# Patient Record
Sex: Female | Born: 1970
Health system: Southern US, Community
[De-identification: ages and names within clinical notes are randomized; demographics above are authoritative.]

## PROBLEM LIST (undated history)

## (undated) DIAGNOSIS — R7989 Other specified abnormal findings of blood chemistry: Secondary | ICD-10-CM

## (undated) DIAGNOSIS — F909 Attention-deficit hyperactivity disorder, unspecified type: Secondary | ICD-10-CM

## (undated) DIAGNOSIS — M1990 Unspecified osteoarthritis, unspecified site: Secondary | ICD-10-CM

## (undated) DIAGNOSIS — K819 Cholecystitis, unspecified: Secondary | ICD-10-CM

## (undated) DIAGNOSIS — Z9889 Other specified postprocedural states: Secondary | ICD-10-CM

## (undated) DIAGNOSIS — S2239XA Fracture of one rib, unspecified side, initial encounter for closed fracture: Secondary | ICD-10-CM

## (undated) DIAGNOSIS — S3613XA Injury of bile duct, initial encounter: Secondary | ICD-10-CM

## (undated) DIAGNOSIS — L4050 Arthropathic psoriasis, unspecified: Secondary | ICD-10-CM

## (undated) DIAGNOSIS — R112 Nausea with vomiting, unspecified: Secondary | ICD-10-CM

## (undated) HISTORY — DX: Arthropathic psoriasis, unspecified: L40.50

## (undated) HISTORY — DX: Unspecified osteoarthritis, unspecified site: M19.90

## (undated) HISTORY — DX: Cholecystitis, unspecified: K81.9

## (undated) HISTORY — PX: INTRAUTERINE DEVICE INSERTION: SHX323

## (undated) HISTORY — DX: Fracture of one rib, unspecified side, initial encounter for closed fracture: S22.39XA

## (undated) HISTORY — DX: Other specified abnormal findings of blood chemistry: R79.89

---
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
## 2002-09-14 ENCOUNTER — Other Ambulatory Visit: Admission: RE | Admit: 2002-09-14 | Discharge: 2002-09-14 | Payer: Self-pay | Admitting: Obstetrics and Gynecology

## 2002-09-14 ENCOUNTER — Inpatient Hospital Stay (HOSPITAL_COMMUNITY): Admission: AD | Admit: 2002-09-14 | Discharge: 2002-09-14 | Payer: Self-pay | Admitting: Obstetrics and Gynecology

## 2003-03-12 ENCOUNTER — Inpatient Hospital Stay (HOSPITAL_COMMUNITY): Admission: AD | Admit: 2003-03-12 | Discharge: 2003-03-12 | Payer: Self-pay | Admitting: Obstetrics & Gynecology

## 2003-04-10 ENCOUNTER — Inpatient Hospital Stay (HOSPITAL_COMMUNITY): Admission: AD | Admit: 2003-04-10 | Discharge: 2003-04-13 | Payer: Self-pay | Admitting: Obstetrics & Gynecology

## 2003-05-23 ENCOUNTER — Other Ambulatory Visit: Admission: RE | Admit: 2003-05-23 | Discharge: 2003-05-23 | Payer: Self-pay | Admitting: Obstetrics and Gynecology

## 2006-01-27 ENCOUNTER — Ambulatory Visit: Payer: Self-pay | Admitting: Internal Medicine

## 2006-06-13 ENCOUNTER — Ambulatory Visit: Payer: Self-pay | Admitting: Internal Medicine

## 2006-10-31 ENCOUNTER — Ambulatory Visit: Payer: Self-pay | Admitting: Internal Medicine

## 2006-12-23 ENCOUNTER — Other Ambulatory Visit: Admission: RE | Admit: 2006-12-23 | Discharge: 2006-12-23 | Payer: Self-pay | Admitting: Obstetrics & Gynecology

## 2007-09-06 DIAGNOSIS — J309 Allergic rhinitis, unspecified: Secondary | ICD-10-CM | POA: Insufficient documentation

## 2008-02-02 ENCOUNTER — Other Ambulatory Visit: Admission: RE | Admit: 2008-02-02 | Discharge: 2008-02-02 | Payer: Self-pay | Admitting: Obstetrics and Gynecology

## 2008-09-15 ENCOUNTER — Emergency Department (HOSPITAL_COMMUNITY): Admission: EM | Admit: 2008-09-15 | Discharge: 2008-09-15 | Payer: Self-pay | Admitting: Emergency Medicine

## 2008-12-17 ENCOUNTER — Ambulatory Visit: Payer: Self-pay | Admitting: Internal Medicine

## 2008-12-30 ENCOUNTER — Ambulatory Visit: Payer: Self-pay | Admitting: Internal Medicine

## 2009-02-03 ENCOUNTER — Other Ambulatory Visit: Admission: RE | Admit: 2009-02-03 | Discharge: 2009-02-03 | Payer: Self-pay | Admitting: Obstetrics and Gynecology

## 2009-10-14 ENCOUNTER — Ambulatory Visit: Payer: Self-pay | Admitting: Internal Medicine

## 2009-10-14 DIAGNOSIS — L4050 Arthropathic psoriasis, unspecified: Secondary | ICD-10-CM

## 2010-01-09 ENCOUNTER — Ambulatory Visit: Payer: Self-pay | Admitting: Internal Medicine

## 2010-02-03 ENCOUNTER — Encounter: Payer: Self-pay | Admitting: Internal Medicine

## 2010-02-19 ENCOUNTER — Ambulatory Visit: Payer: Self-pay | Admitting: Sports Medicine

## 2010-03-09 ENCOUNTER — Encounter: Payer: Self-pay | Admitting: Internal Medicine

## 2010-05-14 ENCOUNTER — Encounter: Payer: Self-pay | Admitting: Internal Medicine

## 2010-10-28 ENCOUNTER — Ambulatory Visit: Payer: Self-pay | Admitting: Internal Medicine

## 2010-12-20 ENCOUNTER — Encounter: Payer: Self-pay | Admitting: Orthopedic Surgery

## 2010-12-28 ENCOUNTER — Ambulatory Visit
Admission: RE | Admit: 2010-12-28 | Discharge: 2010-12-28 | Payer: Self-pay | Source: Home / Self Care | Attending: Internal Medicine | Admitting: Internal Medicine

## 2010-12-29 ENCOUNTER — Ambulatory Visit
Admission: RE | Admit: 2010-12-29 | Discharge: 2010-12-29 | Payer: Self-pay | Source: Home / Self Care | Attending: Internal Medicine | Admitting: Internal Medicine

## 2010-12-29 NOTE — Assessment & Plan Note (Signed)
Summary: NP,HIP PAIN,RUNNER,MC   Vital Signs:  Patient profile:   40 year old female Height:      67 inches Weight:      150 pounds BMI:     23.58 BP sitting:   121 / 79  Vitals Entered By: Lillia Pauls CMA (February 19, 2010 2:29 PM)  History of Present Illness: 1 year ago to 85 mos had RT LBP epidural injection helped for 4 wks Dr August Saucer has followed more recently tingling down outside of RT leg NCV neg Injection of L2 did not help  RT lat foot pain severe xray neg Injection of SIJ and area resolved all pain for 2 weeks - dr newton SIJ area now pain now back and also piriformis  psoriatic arthritis dx at 28 Embrel started and felt better in 1 mo joint pain in hands, hip and back - this all improved  ideally runs 25 to 30 mpw running since age 89  since half marathon 5 days ago limping w post hip pain and also radiating form RT SIJ to ant thiigh    Allergies (verified): No Known Drug Allergies  Physical Exam  General:  Well-developed,well-nourished,in no acute distress; alert,appropriate and cooperative throughout examination Msk:  .RT hip w 30 deg IR and 40 deg of ER Left shows 30 and 45 weakness of hip abductors mod on left and mild on RT good hip ext rot srength and nl flex strength  TTP directly over piriformis on RT and this is also brought out with stretches  FABER - norm but painful on RT SIJ - both mobile and good pretzel stretch  Extremities:  bunion on left some first MTP change on RT mod collapse of arch  Running gait - antalgic on RT w clear limp   Impression & Recommendations:  Problem # 1:  HIP PAIN, RIGHT (ICD-719.45) There are findings consistent with piriformis but I don't think this explains all of findings  piriformis stretches hip strength exercises and part glut med  reck 1 month  Problem # 2:  BACK PAIN, LUMBAR, WITH RADICULOPATHY (ICD-724.4) This may be 2/2 SIJ since last lumbar injection did not help last SIJ injection got  rid of pain and numbness but only for 4 wks  note - this actually seems more mobile and not like chg seen w psoriasis  Problem # 3:  PSORIATIC ARTHRITIS (ICD-696.0) not clear if this could be causing sxs however, by Hx suggested she got excelletn response from embrel and no real flares that are generalized since  suggested seeing Dr Kellie Simmering as she does not have a rheumatologist now  Problem # 4:  ABNORMALITY OF GAIT (ICD-781.2)  this is very limited suggested x train and no running until no limp I don't think this is likely a stress fx based on hip exam and lack of groin pain but will follow  Orders: Sports Insoles (L3510)  Problem # 5:  BUNION, LEFT FOOT (ICD-727.1)  will put in cushioned sports insoles as she has lots of foot breakdown felt better in these w less imp w running  orthotics in future  Orders: Sports Insoles (L3510)  Complete Medication List: 1)  Multivitamins Tabs (Multiple vitamin) .... Once daily 2)  Omega-3 350 Mg Caps (Omega-3 fatty acids) .... Two times a day 3)  Enbrel 25 Mg/0.70ml Soln (Etanercept) .... Twice weekly--uses once a week

## 2010-12-29 NOTE — Letter (Signed)
Summary: [facility]  [facility]   Imported By: Maryln Gottron 05/27/2010 10:38:18  _____________________________________________________________________  External Attachment:    Type:   Image     Comment:   External Document

## 2010-12-29 NOTE — Letter (Signed)
Summary: [facility]  [facility]   Imported By: Maryln Gottron 04/02/2010 15:55:33  _____________________________________________________________________  External Attachment:    Type:   Image     Comment:   External Document

## 2010-12-29 NOTE — Assessment & Plan Note (Signed)
Summary: SORE THROAT, CONGESTION, BODY ACHES // RS   Vital Signs:  Patient profile:   40 year old female Weight:      150 pounds Temp:     99.2 degrees F BP sitting:   108 / 72  Vitals Entered By: Duard Brady LPN (January 09, 2010 9:20 AM) CC: c/o sore throat congestion x1week , co worked has strep last week ,body aches   CC:  c/o sore throat congestion x1week , co worked has strep last week , and body aches.  History of Present Illness: 40 year old patient who presents with a 5-day history of head and  chest congestion, and malaise.  For the past few days.  He is developed worsening sore throat.  This is her chief complaint.  Denies any fever or cervical adenopathy or strep exposure.  Sunday she ran 11 miles but has had extreme malaise over the past few days  Allergies: No Known Drug Allergies  Past History:  Past Medical History: Reviewed history from 12/17/2008 and no changes required. Arthritis Psoriasis Allergic rhinitis  Review of Systems       The patient complains of anorexia.  The patient denies fever, weight loss, weight gain, vision loss, decreased hearing, hoarseness, chest pain, syncope, dyspnea on exertion, peripheral edema, prolonged cough, headaches, hemoptysis, abdominal pain, melena, hematochezia, severe indigestion/heartburn, hematuria, incontinence, genital sores, muscle weakness, suspicious skin lesions, transient blindness, difficulty walking, depression, unusual weight change, abnormal bleeding, enlarged lymph nodes, angioedema, and breast masses.    Physical Exam  General:  Well-developed,well-nourished,in no acute distress; alert,appropriate and cooperative throughout examination; low-pressure normal Head:  Normocephalic and atraumatic without obvious abnormalities. No apparent alopecia or balding. Eyes:  No corneal or conjunctival inflammation noted. EOMI. Perrla. Funduscopic exam benign, without hemorrhages, exudates or papilledema. Vision  grossly normal. Ears:  External ear exam shows no significant lesions or deformities.  Otoscopic examination reveals clear canals, tympanic membranes are intact bilaterally without bulging, retraction, inflammation or discharge. Hearing is grossly normal bilaterally. Nose:  External nasal examination shows no deformity or inflammation. Nasal mucosa are pink and moist without lesions or exudates. Mouth:  pharyngeal erythema.  very mildpharyngeal erythema.   Neck:  No deformities, masses, or tenderness noted. no cervical adenopathy Lungs:  Normal respiratory effort, chest expands symmetrically. Lungs are clear to auscultation, no crackles or wheezes.   Impression & Recommendations:  Problem # 1:  URI (ICD-465.9)  Problem # 2:  SORE THROAT (ICD-462)  Orders: Rapid Strep (16109)  Complete Medication List: 1)  Multivitamins Tabs (Multiple vitamin) .... Once daily 2)  Omega-3 350 Mg Caps (Omega-3 fatty acids) .... Two times a day 3)  Enbrel 25 Mg/0.76ml Soln (Etanercept) .... Twice weekly--uses once a week  Patient Instructions: 1)  Get plenty of rest, drink lots of clear liquids, and use Tylenol  for fever and comfort. Return in 7-10 days if you're not better:sooner if you're feeling worse. 2)  VIMOVO ONE TWICE DAILY

## 2010-12-29 NOTE — Assessment & Plan Note (Signed)
Summary: chest congestion/cough/njr   Vital Signs:  Patient profile:   40 year old female Weight:      155 pounds Temp:     97.7 degrees F oral BP sitting:   112 / 68  (left arm) Cuff size:   regular  Vitals Entered By: Alfred Levins, CMA (October 28, 2010 11:41 AM) CC: cough, chest aches x1 wk   CC:  cough and chest aches x1 wk.  History of Present Illness: Chest congestion---nocturnal cough, chest aching minimal SOB not wheezing has hx of psoriasis---Enbrel use up to two months ago.   Current Medications (verified): 1)  Multivitamins  Tabs (Multiple Vitamin) .... Once Daily 2)  Omega-3 350 Mg Caps (Omega-3 Fatty Acids) .... Two Times A Day  Allergies (verified): No Known Drug Allergies  Physical Exam  General:  well-developed, thin female in no acute distress. HEENT exam atraumatic, normocephalic, neck supple. Chest without increased work of breathing. She does have rhonchi bilaterally most of them clear with cough.   Impression & Recommendations:  Problem # 1:  URI (ICD-465.9) immunosuppressed given duration, abnormal lung findings and history of immunosuppression I think it's safe assistive treat with an antibiotic. Side effects discussed. She'll call if symptoms don't improve.  Complete Medication List: 1)  Multivitamins Tabs (Multiple vitamin) .... Once daily 2)  Omega-3 350 Mg Caps (Omega-3 fatty acids) .... Two times a day 3)  Doxycycline Hyclate 100 Mg Caps (Doxycycline hyclate) .... Take 1 tab twice a day Prescriptions: DOXYCYCLINE HYCLATE 100 MG CAPS (DOXYCYCLINE HYCLATE) Take 1 tab twice a day  #20 x 0   Entered and Authorized by:   Birdie Sons MD   Signed by:   Birdie Sons MD on 10/28/2010   Method used:   Electronically to        [facility]  [address] [phone]* (retail)       [address]       [place], [place]  [address]       Ph: [phone] or [phone]       Fax: [phone]   RxID:   [id number]    Orders  Added: 1)  Est. Patient Level III [36644]

## 2010-12-29 NOTE — Letter (Signed)
Summary: [facility]  [facility]   Imported By: Maryln Gottron 02/25/2010 13:20:05  _____________________________________________________________________  External Attachment:    Type:   Image     Comment:   External Document

## 2010-12-30 ENCOUNTER — Telehealth: Payer: Self-pay | Admitting: Internal Medicine

## 2010-12-30 NOTE — Telephone Encounter (Signed)
Pt would like xray results. 

## 2010-12-30 NOTE — Telephone Encounter (Signed)
Pt called and was give xray results. Pt is wondering what she needs to do to sch a PFT? Pls call pt back.

## 2011-01-06 NOTE — Assessment & Plan Note (Signed)
Summary: CHEST CONGESTION // RS   Vital Signs:  Patient profile:   40 year old female Weight:      150 pounds BMI:     23.58 Temp:     97.9 degrees F oral BP sitting:   112 / 76  (left arm) Cuff size:   regular  Vitals Entered By: Alfred Levins, CMA (December 28, 2010 11:35 AM) CC: chest congestion, cough x1 wk   CC:  chest congestion and cough x1 wk.  History of Present Illness: cold early last week some chest tightness some "bubbling" chest sounds no fever or chills  never got completley better from october.   Current Medications (verified): 1)  Vitamin D 400 Unit Tabs (Cholecalciferol) .... Once Daily  Allergies (verified): No Known Drug Allergies  Past History:  Past Medical History: Last updated: 12/17/2008 Arthritis Psoriasis Allergic rhinitis  Past Surgical History: Last updated: 09/06/2007 Tonsillectomy  Family History: Last updated: 12/17/2008 Family History of Arthritis  Social History: Last updated: 09/06/2007 Married Never Smoked Alcohol use-yes Regular exercise-yes  Risk Factors: Exercise: yes (09/06/2007)  Risk Factors: Smoking Status: never (10/14/2009)  Physical Exam  General:   well-developed female in no acute distress. HEENT exam atraumatic, normocephalic, oropharynx is pink and moist. Neck is supple without lymphadenopathy or thyromegaly. Chest current auscultation with few rhonchi. The rhonchi clear with coughing. Cardiac exam S1-S2  normal. Extremities no edema.   Complete Medication List: 1)  Vitamin D 400 Unit Tabs (Cholecalciferol) .... Once daily  Other Orders: T-2 View CXR (71020TC)   Orders Added: 1)  T-2 View CXR [71020TC] 2)  Est. Patient Level III [03474]

## 2011-04-16 NOTE — H&P (Signed)
NAME:  Samantha Graves, Samantha Graves                       ACCOUNT NO.:  [id number]   MEDICAL RECORD NO.:  [id number]                   PATIENT TYPE:  INP   LOCATION:  9171                                 FACILITY:  [facility]   PHYSICIAN:  Genia Del, M.D.             DATE OF BIRTH:  1971-11-16   DATE OF ADMISSION:  04/10/2003  DATE OF DISCHARGE:                                HISTORY & PHYSICAL   HISTORY OF PRESENT ILLNESS:  Samantha Graves is a 40 year old G3, P2, A0 with  last menstrual period July 19, 2002 for an estimated date of delivery of  Apr 26, 2003 at 37 weeks and 5 days gestation.   REASON FOR ADMISSION:  PIH with increased liver enzymes.   HOSPITAL COURSE:  The patient presented to a return OB visit at [facility] on Apr 09, 2002. At this time, she was complaining of new onset of  headaches and mild nausea. Her history was positive for PIH times two at  term previously and her blood pressure was slightly increased but with a  normal in the 120's over 80's. Urine was negative for protein. PIH labs were  requested and platelets were stable at 142,000. Uric acid was normal at 4.2.  AST was slightly increased at 38. ALT was normal. Alkaline phosphatase was  slightly increased. The decision was taken to repeat those PIH labs later in  the afternoon and the AST was further increased mildly at 43. The rest was  stable with platelets at 146,000. The decision was taken to admit the  patient for induction. She was also reporting visual problems with blurred  vision and sustained headaches with nausea. Fetal movement is positive. No  regular uterine contractions. No vaginal bleeding. No fluid leak.   PAST MEDICAL HISTORY:  Positive for psoriatic arthritis, in remission.  History of migraines.   PAST SURGICAL HISTORY:  Negative.   PAST OB HISTORY:  In January of 1999 at 41 weeks, spontaneous vaginal  delivery of baby boy, 8 pounds and 2 ounces with mild PIH. In October of  2000 at  40 weeks, spontaneous vaginal delivery induced for mild PIH of a  girl, 8 pounds and 2 ounces after spontaneous vaginal delivery.   GYN HISTORY:  Negative.   SOCIAL HISTORY:  Married. Non-smoker.   FAMILY HISTORY:  Noncontributory.   ALLERGIES:  No known drug allergies.   MEDICATIONS:  Prenatal vitamins.   HISTORY OF PRESENT PREGNANCY:  First trimester was normal. Her labs showed  hemoglobin 13.1, platelets 172,000. Blood type Rh and O+. Rh antibody  negative. RPR non-reactive. ABS-AG negative. HIV declined. Rubella titer  immune. PAP test within normal limits. Gonorrhea and Chlamydia negative. She  had hyperemesis in the first trimester, treated with Zofran. She has also  Candida vaginitis, treated with Gynazole. Her psoriatic arthritis was in  remission. She was not on any medication for that. In the second trimester,  her triple  test was within normal limits. At 22+ weeks, ultrasound review of  anatomy was within normal limits with normal fluids, normal placenta and  cervix at 4.3 cm. In the third trimester, she had a 1-hour glucose tolerance  test within normal limits. At 29+ weeks, she was treated with Augmentin for  symptomatic sinusitis. At 35+ weeks, group B strep was positive. Her blood  pressures remained normal until the last visit. Good weight gain and  appropriate for gestational age.   REVIEW OF SYSTEMS:  Constitutional:  Negative. HEENT:  Negative. GI:  Nausea  today. No vomiting and no diarrhea. Urologic:  Negative. Dermatologic:  Negative. Endocrine:  Negative. Neurologic:  Headaches worsening today.   PHYSICAL EXAMINATION:  GENERAL:  No apparent distress.  VITAL SIGNS:  Blood pressure 121/68 and 129/71 on admission. Pulse at 90 and  regular. Afebrile. Respiratory rate 20.  LUNGS:  Clear bilaterally.  HEART:  Regular rate and rhythm.  No murmur.  ABDOMEN:  Gravid. Cephalic presentation. Monitoring fetal heart rate 130 to  140. Good aspiration. No variability. No  deceleration. No regular uterine  contractions.  GU:  Vaginal examination 1+ and 100% effaced, vertex -1 fixed.  Membranes  intact.  EXTREMITIES:  Lower limbs, mild edema. No clonus.   LABORATORY DATA:  PIH labs at the office this afternoon:  AST of 43, ALT 28,  ALP 140, uric acid 4.2, platelets 146,000. Hemoglobin 11.6.   IMPRESSION:  Gravida 3, para 2 at 37 weeks and 5 days gestation with mild  pregnancy-induced hypertension with slight increased AST. History of  pregnancy-induced hypertension times two. Fetal well being reassuring. Group  B strep positive.   PLAN:  Admit to labor and delivery for induction. Will start Pitocin low  dose, penicillin G for group B strep positive. Expectant __________ towards  probable vaginal delivery. Repeat PIH labs now and will repeat sequentially  per the clinic. No magnesium sulfate for now, given the fact that it is a  mild PIH.                                               Genia Del, M.D.    ML/MEDQ  D:  04/10/2003  T:  04/10/2003  Job:  [id number]

## 2011-08-31 LAB — DIFFERENTIAL
Eosinophils Absolute: 0.1
Eosinophils Relative: 1
Lymphocytes Relative: 24
Lymphs Abs: 1.3
Monocytes Relative: 5
Neutrophils Relative %: 68

## 2011-08-31 LAB — CBC: HCT: 40.4

## 2011-08-31 LAB — APTT: aPTT: 27

## 2011-08-31 LAB — PROTIME-INR
INR: 1.1
Prothrombin Time: 14.3

## 2011-08-31 LAB — POCT I-STAT, CHEM 8: HCT: 39

## 2011-11-05 ENCOUNTER — Telehealth: Payer: Self-pay | Admitting: Internal Medicine

## 2011-11-05 NOTE — Telephone Encounter (Signed)
Pt called and has flu like symptoms. Fever, body aches, sorethroat and runny nose. Req call back from nurse or work in [employer].

## 2011-11-05 NOTE — Telephone Encounter (Signed)
I called pt. She had not heard back from [place], so went to the mini-clinic.

## 2012-03-31 LAB — HM PAP SMEAR: HM PAP: NEGATIVE

## 2012-05-25 ENCOUNTER — Other Ambulatory Visit: Payer: Self-pay | Admitting: Orthopedic Surgery

## 2012-05-25 ENCOUNTER — Ambulatory Visit
Admission: RE | Admit: 2012-05-25 | Discharge: 2012-05-25 | Disposition: A | Discharge status: Home / Self Care | Payer: BC Managed Care – PPO | Source: Ambulatory Visit | Attending: Orthopedic Surgery | Admitting: Orthopedic Surgery

## 2012-05-25 DIAGNOSIS — R52 Pain, unspecified: Secondary | ICD-10-CM

## 2012-05-25 MED ORDER — IOHEXOL 300 MG/ML  SOLN
100.0000 mL | Freq: Once | INTRAMUSCULAR | Status: AC | PRN
  Administered 2012-05-25: 100 mL via INTRAVENOUS

## 2012-12-12 ENCOUNTER — Other Ambulatory Visit: Payer: Self-pay | Admitting: Nurse Practitioner

## 2012-12-12 DIAGNOSIS — Z1231 Encounter for screening mammogram for malignant neoplasm of breast: Secondary | ICD-10-CM

## 2013-01-12 ENCOUNTER — Ambulatory Visit: Payer: BC Managed Care – PPO

## 2013-01-29 ENCOUNTER — Other Ambulatory Visit: Payer: Self-pay | Admitting: Nurse Practitioner

## 2013-01-29 DIAGNOSIS — N6452 Nipple discharge: Secondary | ICD-10-CM

## 2013-02-20 ENCOUNTER — Ambulatory Visit: Payer: BC Managed Care – PPO

## 2013-02-27 ENCOUNTER — Ambulatory Visit
Admission: RE | Admit: 2013-02-27 | Discharge: 2013-02-27 | Disposition: A | Discharge status: Home / Self Care | Payer: Self-pay | Source: Ambulatory Visit | Attending: Nurse Practitioner | Admitting: Nurse Practitioner

## 2013-02-27 ENCOUNTER — Other Ambulatory Visit: Payer: Self-pay | Admitting: Nurse Practitioner

## 2013-02-27 DIAGNOSIS — N6452 Nipple discharge: Secondary | ICD-10-CM

## 2013-03-06 ENCOUNTER — Telehealth: Payer: Self-pay | Admitting: *Deleted

## 2013-03-06 NOTE — Telephone Encounter (Signed)
See chart on your desk for mammogram result done 02/27/2013. ? On hold or take off. Please advise. sue

## 2013-03-06 NOTE — Telephone Encounter (Signed)
My thinking is Ok to release, but Dr. Edward Jolly saw her and may want to do something about the nipple discharge. She may want a short follow here.  Please forward this to her for tomorrow. thanks

## 2013-03-06 NOTE — Telephone Encounter (Signed)
See note below regarding report of mammogram. Samantha Graves. Please advise. Chart on your desk.

## 2013-03-07 NOTE — Telephone Encounter (Signed)
Fannie Knee,  OK to removed from mammogram hold.  Patient has had normal diagnostic bilateral mammogram an left breast ultrasound.  (Radiologist expressed discharge from multiple left breast ducts.)  Patient has had normal prolactin and TSH.    Next mammogram is due in one year.  Conley Simmonds, M.D.

## 2013-03-08 ENCOUNTER — Telehealth: Payer: Self-pay | Admitting: *Deleted

## 2013-03-08 ENCOUNTER — Encounter: Payer: Self-pay | Admitting: *Deleted

## 2013-03-08 NOTE — Telephone Encounter (Signed)
MAMMOGRAM TOOK OFF OF HOLD. PER DR. MILLER.

## 2013-03-08 NOTE — Telephone Encounter (Signed)
Mammogram took off hold per Dr. Edward Jolly.

## 2013-04-02 ENCOUNTER — Ambulatory Visit (INDEPENDENT_AMBULATORY_CARE_PROVIDER_SITE_OTHER): Payer: BC Managed Care – PPO | Admitting: Nurse Practitioner

## 2013-04-02 ENCOUNTER — Encounter: Payer: Self-pay | Admitting: Nurse Practitioner

## 2013-04-02 VITALS — BP 108/66 | HR 58 | Resp 12 | Ht 68.0 in | Wt 156.4 lb

## 2013-04-02 DIAGNOSIS — Z01419 Encounter for gynecological examination (general) (routine) without abnormal findings: Secondary | ICD-10-CM

## 2013-04-02 DIAGNOSIS — Z0000 Encounter for general adult medical examination without abnormal findings: Secondary | ICD-10-CM

## 2013-04-02 LAB — POCT URINALYSIS DIPSTICK
Leukocytes, UA: NEGATIVE
pH, UA: 7

## 2013-04-02 NOTE — Patient Instructions (Signed)

## 2013-04-02 NOTE — Progress Notes (Signed)
42 y.o. Married Caucasian Fe here for annual exam.  No vaginal bleeding or spotting  With Mirena IUD.  Still having issues with decreased libido.  Recent hormonal test were about  2 months and reported as normal (but review of labs at Rheumatologist showed only TSH, CBC, CMP, Vit B 12 and TB Gold.   \No LMP recorded. Patient is not currently having periods (Reason: IUD).          Sexually active: yes  The current method of family planning is IUD placed 05/06/2010 Mirena Exercising: yes  Home exercise routine includes marathon. Smoker:  no  Health Maintenance: Pap:  03/31/2012-normal with negative HR HPV MMG: 02/27/13 normal after ultrasound &  3D done secondary to nipple discharge but has resolved and ductogram not indicated by radiologist. Colonoscopy:  never TDaP:  02/25/2011 Labs: Hgb-13.2     Past Medical History  Diagnosis Date  . Arthritis     psoriatic     History reviewed. No pertinent past surgical history.  Current Outpatient Prescriptions  Medication Sig Dispense Refill  . fish oil-omega-3 fatty acids 1000 MG capsule Take 2 g by mouth daily.      . vitamin D, CHOLECALCIFEROL, 400 UNITS tablet Take 400 Units by mouth daily.         No current facility-administered medications for this visit.    History reviewed. No pertinent family history.  ROS:  Pertinent items are noted in HPI.  Otherwise, a comprehensive ROS was negative.  Exam:   BP 108/66  Pulse 58  Resp 12  Ht 5\' 8"  (1.727 m)  Wt 156 lb 6.4 oz (70.943 kg)  BMI 23.79 kg/m2 Height: 5\' 8"  (172.7 cm)  Ht Readings from Last 3 Encounters:  04/02/13 5\' 8"  (1.727 m)  02/19/10 5\' 7"  (1.702 m)    General appearance: alert, cooperative and appears stated age Head: Normocephalic, without obvious abnormality, atraumatic Neck: no adenopathy, supple, symmetrical, trachea midline and thyroid normal to inspection and palpation Lungs: clear to auscultation bilaterally Breasts: normal appearance, no masses or  tenderness, No nipple retraction or dimpling, No nipple discharge or bleeding Heart: regular rate and rhythm Abdomen: soft, non-tender; no masses,  no organomegaly Extremities: extremities normal, atraumatic, no cyanosis or edema Skin: Skin color, texture, turgor normal. No rashes or lesions Lymph nodes: Cervical, supraclavicular, and axillary nodes normal. No abnormal inguinal nodes palpated Neurologic: Grossly normal   Pelvic: External genitalia:  no lesions              Urethra:  normal appearing urethra with no masses, tenderness or lesions              Bartholin's and Skene's: normal                 Vagina: normal appearing vagina with normal color and discharge, no lesions              Cervix: anteverted              Pap taken: no Bimanual Exam:  Uterus:  normal size, contour, position, consistency, mobility, non-tender              Adnexa: no mass, fullness, tenderness               Rectovaginal: Confirms               Anus:  normal sphincter tone, no lesions  A:  Well Woman with normal exam  Mirena IUD for contraception  History of decreased libido  P:   Pap smear as per guidelines   Mammogram repeat 02/2014  counseled on adequate intake of calcium and vitamin D,    diet and exercise  Discussed Avlumil as an herbal product not studied by FDA that may be helpful in libido. Husband has  offered a vasectomy, but patient enjoys not having menses at al on Mirena IUD.  return annually or prn  An After Visit Summary was printed and given to the patient.

## 2013-04-03 LAB — HEMOGLOBIN, FINGERSTICK: Hemoglobin, fingerstick: 13.2 g/dL (ref 12.0–16.0)

## 2013-04-05 NOTE — Progress Notes (Signed)
Encounter reviewed by Dr. Valda Christenson Silva.  

## 2013-04-23 ENCOUNTER — Emergency Department (HOSPITAL_COMMUNITY)
Admission: EM | Admit: 2013-04-23 | Discharge: 2013-04-23 | Disposition: A | Discharge status: Home / Self Care | Payer: BC Managed Care – PPO | Source: Home / Self Care | Attending: Emergency Medicine | Admitting: Emergency Medicine

## 2013-04-23 ENCOUNTER — Encounter (HOSPITAL_COMMUNITY): Payer: Self-pay | Admitting: *Deleted

## 2013-04-23 DIAGNOSIS — W64XXXA Exposure to other animate mechanical forces, initial encounter: Secondary | ICD-10-CM

## 2013-04-23 DIAGNOSIS — T07XXXA Unspecified multiple injuries, initial encounter: Secondary | ICD-10-CM

## 2013-04-23 MED ORDER — AMOXICILLIN-POT CLAVULANATE 875-125 MG PO TABS: 1.0000 | ORAL_TABLET | Freq: Two times a day (BID) | ORAL | Status: DC

## 2013-04-23 NOTE — ED Provider Notes (Signed)
Chief Complaint:   Chief Complaint  Patient presents with  . Animal Bite    History of Present Illness:   Samantha Graves is a 42 year old female who was scratched by her cat on her left leg and left and right hands and arms yesterday. She was trying to help her cat get used to an visible fence, when the cat got scared and tried to climb up her leg resulting in multiple scratches. She does not think she was bitten. All of the scratches are a little bit painful but she's concerned about the ones in the right hand in particular. They're a little bit more painful than the others and the hand seems slightly swollen and tight. There is no erythema or purulent drainage. She denies any fever or chills. She thinks she has had a tetanus shot in the past couple of years. She's concerned since she is on Enbrel for psoriatic arthritis.  Review of Systems:  Other than noted above, the patient denies any of the following symptoms: Systemic:  No fevers, chills, sweats, or aches.  No fatigue or tiredness. Musculoskeletal:  No joint pain, arthritis, bursitis, swelling, back pain, or neck pain. Neurological:  No muscular weakness, paresthesias, headache, or trouble with speech or coordination.  No dizziness.  PMFSH:  Past medical history, family history, social history, meds, and allergies were reviewed.  No medication allergies. She is on Enbrel, but her last dosage was about 2 weeks ago. [name].  Physical Exam:   Vital signs:  BP 132/73  Pulse 60  Temp(Src) 98.2 F (36.8 C) (Oral)  Resp 18  SpO2 100% Gen:  Alert and oriented times 3.  In no distress. Musculoskeletal: Multiple scratches on her left leg and both forearms and hands. No these appear erythematous or draining any pus. There is some pain to palpation of her cervical and scratches on the right hand. There is slight puffiness. All her joints have a full range of motion with no pain.  Otherwise, all joints had a full a ROM with  no swelling, bruising or deformity.  No edema, pulses full. Extremities were warm and pink.  Capillary refill was brisk.  Skin:  Clear, warm and dry.  No rash. Neuro:  Alert and oriented times 3.  Muscle strength was normal.  Sensation was intact to light touch.   Assessment:  The encounter diagnosis was Cat scratch, initial encounter.  May be some mild, early infection in the right hand. Go ahead and start with Augmentin and she was encouraged to either come back here or followup with her primary care doctor if it should get worse in any way.  Plan:   1.  The following meds were prescribed:   New Prescriptions   AMOXICILLIN-CLAVULANATE (AUGMENTIN) 875-125 MG PER TABLET    Take 1 tablet by mouth 2 (two) times daily.   2.  The patient was instructed in symptomatic care, including rest and activity, elevation, application of ice and compression.  Appropriate handouts were given. 3.  The patient was told to return if becoming worse in any way, if no better in 3 or 4 days, and given some red flag symptoms such as increasing pain or erythema, purulent drainage, or fever that would indicate earlier return.   4.  The patient was told to follow up either here or with Dr. Birdie Sons if they should become worse in any way.    Reuben Likes, MD 04/23/13 [phone]

## 2013-04-23 NOTE — ED Notes (Signed)
Pt  States  She  Was  Scratched  By  Her  Cat      3  Days  Ago  When  She  Was    Pulling  Cat  Away from    An invisible  Fence        She  Has  Scratches        On her  Legs  And  r  Hand  The  Had  Is  Slightly  Red  And  Slightly  Swollen

## 2014-04-03 ENCOUNTER — Ambulatory Visit: Payer: BC Managed Care – PPO | Admitting: Nurse Practitioner

## 2014-05-02 ENCOUNTER — Encounter: Payer: Self-pay | Admitting: Nurse Practitioner

## 2014-05-02 ENCOUNTER — Ambulatory Visit (INDEPENDENT_AMBULATORY_CARE_PROVIDER_SITE_OTHER): Payer: BC Managed Care – PPO | Admitting: Nurse Practitioner

## 2014-05-02 VITALS — BP 110/62 | HR 64 | Ht 68.0 in | Wt 142.0 lb

## 2014-05-02 DIAGNOSIS — Z01419 Encounter for gynecological examination (general) (routine) without abnormal findings: Secondary | ICD-10-CM

## 2014-05-02 DIAGNOSIS — Z0000 Encounter for general adult medical examination without abnormal findings: Secondary | ICD-10-CM

## 2014-05-02 DIAGNOSIS — R350 Frequency of micturition: Secondary | ICD-10-CM

## 2014-05-02 LAB — POCT URINALYSIS DIPSTICK
Bilirubin, UA: NEGATIVE
GLUCOSE UA: NEGATIVE
Ketones, UA: NEGATIVE
NITRITE UA: NEGATIVE
PH UA: 5
PROTEIN UA: NEGATIVE
RBC UA: NEGATIVE
UROBILINOGEN UA: NEGATIVE

## 2014-05-02 MED ORDER — NITROFURANTOIN MONOHYD MACRO 100 MG PO CAPS: 100.0000 mg | ORAL_CAPSULE | Freq: Two times a day (BID) | ORAL | Status: DC

## 2014-05-02 NOTE — Progress Notes (Signed)
Patient ID: Samantha Graves, female   DOB: [birth year], 43 y.o.   MRN: [id number] 43 y.o. [id number] Married Caucasian Fe here for annual exam.  No vaginal bleeding on Mirena IUD.  Due for change next summer.  No PMS.  No pain with SA.  Enbrel is helping with psoriatic arthritis.  She wants to get labs done here today for labs with rheumatology and our routine labs.she continues to run.  Patient's last menstrual period was 05/06/2010.          Sexually active: yes  The current method of family planning is IUD-Mirena. Placement 05/06/10   Exercising: yes  running, strength training, tennis Smoker:  no  Health Maintenance: Pap: 03/31/2012-normal with negative HR HPV  MMG: 02/27/13 normal after ultrasound & 3D done secondary to nipple discharge but has resolved and ductogram not indicated by radiologist.  TDaP: 02/25/2011  Labs:  HB:    Urine:  Trace leuk's    reports that she has never smoked. She has never used smokeless tobacco. She reports that she drinks alcohol. She reports that she does not use illicit drugs.  Past Medical History  Diagnosis Date  . Arthritis 2003    psoriatic, Dr. Estanislado Pandy    Past Surgical History  Procedure Laterality Date  . Tonsillectomy and adenoidectomy  1990  . Wisdom tooth extraction Bilateral 1991  . Intrauterine device insertion  05/06/10    MIrena IUD    Current Outpatient Prescriptions  Medication Sig Dispense Refill  . etanercept (ENBREL) 25 MG injection Inject 25 mg into the skin once a week.      . fish oil-omega-3 fatty acids 1000 MG capsule Take 2 g by mouth daily.      . vitamin D, CHOLECALCIFEROL, 400 UNITS tablet Take 400 Units by mouth daily.        Marland Kitchen VYVANSE 20 MG capsule Take 1 capsule by mouth daily.       No current facility-administered medications for this visit.    Family History  Problem Relation Age of Onset  . Hyperlipidemia Mother   . Other Father     auto imune  . Coronary artery disease Father     cardio ablation  .  Arthritis/Rheumatoid Sister 30    retinitis pigment diagnosed at 38  . Ulcerative colitis Sister 21    surgical repair J pouch  . COPD Maternal Grandmother   . Bone cancer Maternal Grandfather 49    ROS:  Pertinent items are noted in HPI.  Otherwise, a comprehensive ROS was negative.  Exam:   BP 110/62  Pulse 64  Ht 5\' 8"  (6.834 m)  Wt 142 lb (64.411 kg)  BMI 21.60 kg/m2  LMP 05/06/2010 Height: 5\' 8"  (172.7 cm)  Ht Readings from Last 3 Encounters:  05/02/14 5\' 8"  (1.727 m)  04/02/13 5\' 8"  (1.727 m)  02/19/10 5\' 7"  (1.702 m)    General appearance: alert, cooperative and appears stated age Head: Normocephalic, without obvious abnormality, atraumatic Neck: no adenopathy, supple, symmetrical, trachea midline and thyroid normal to inspection and palpation Lungs: clear to auscultation bilaterally Breasts: normal appearance, no masses or tenderness Heart: regular rate and rhythm Abdomen: soft, non-tender; no masses,  no organomegaly Extremities: extremities normal, atraumatic, no cyanosis or edema Skin: Skin color, texture, turgor normal. No rashes or lesions Lymph nodes: Cervical, supraclavicular, and axillary nodes normal. No abnormal inguinal nodes palpated Neurologic: Grossly normal   Pelvic: External genitalia:  no lesions  Urethra:  normal appearing urethra with no masses, tenderness or lesions              Bartholin's and Skene's: normal                 Vagina: normal appearing vagina with normal color and discharge, no lesions              Cervix: anteverted              Pap taken: no Bimanual Exam:  Uterus:  normal size, contour, position, consistency, mobility, non-tender              Adnexa: no mass, fullness, tenderness               Rectovaginal: Confirms               Anus:  normal sphincter tone, no lesions  A:  Well Woman with normal exam  Mirena IUD  placement 05/06/10 with amenorrhea  Psoriatic arthritis on Enbrel  Urinary frequency - R/O  UTI  P:   Reviewed health and wellness pertinent to exam  Pap smear not taken today  Follow with urine culture - will go ahead and start Macrobid 100 mg BID for a week.  Mammogram is due and will schedule  We were unable to get a list of labs that was needed - so we gave her a note to add our routine labs to theirs the next time she goes.  Counseled on breast self exam, mammography screening, use and side effects of OCP's, adequate intake of calcium and vitamin D, diet and exercise return annually or prn  An After Visit Summary was printed and given to the patient.

## 2014-05-02 NOTE — Patient Instructions (Signed)

## 2014-05-03 LAB — URINALYSIS, MICROSCOPIC ONLY
BACTERIA UA: NONE SEEN
Casts: NONE SEEN
Crystals: NONE SEEN

## 2014-05-03 NOTE — Progress Notes (Signed)
Encounter reviewed by Dr. Brook Silva.  

## 2014-05-04 LAB — URINE CULTURE
COLONY COUNT: NO GROWTH
ORGANISM ID, BACTERIA: NO GROWTH

## 2014-05-27 ENCOUNTER — Telehealth: Payer: Self-pay | Admitting: Emergency Medicine

## 2014-05-27 DIAGNOSIS — Z0189 Encounter for other specified special examinations: Secondary | ICD-10-CM

## 2014-05-27 LAB — COMPREHENSIVE METABOLIC PANEL
ALK PHOS: 36 U/L — AB (ref 39–117)
ALT: 21 U/L (ref 0–35)
AST: 22 U/L (ref 0–37)
Albumin: 4.3 g/dL (ref 3.5–5.2)
BUN: 13 mg/dL (ref 6–23)
CO2: 30 mEq/L (ref 19–32)
Calcium: 9.1 mg/dL (ref 8.4–10.5)
Chloride: 103 mEq/L (ref 96–112)
Creat: 0.81 mg/dL (ref 0.50–1.10)
Glucose, Bld: 87 mg/dL (ref 70–99)
Potassium: 4.1 mEq/L (ref 3.5–5.3)
SODIUM: 138 meq/L (ref 135–145)
TOTAL PROTEIN: 5.9 g/dL — AB (ref 6.0–8.3)
Total Bilirubin: 0.8 mg/dL (ref 0.2–1.2)

## 2014-05-27 LAB — CBC
HCT: 39 % (ref 36.0–46.0)
HEMOGLOBIN: 13.4 g/dL (ref 12.0–15.0)
MCH: 31.9 pg (ref 26.0–34.0)
MCHC: 34.4 g/dL (ref 30.0–36.0)
MCV: 92.9 fL (ref 78.0–100.0)
PLATELETS: 178 10*3/uL (ref 150–400)
RBC: 4.2 MIL/uL (ref 3.87–5.11)
RDW: 12.9 % (ref 11.5–15.5)
WBC: 4.1 10*3/uL (ref 4.0–10.5)

## 2014-05-27 LAB — LIPID PANEL
Cholesterol: 174 mg/dL (ref 0–200)
HDL: 81 mg/dL (ref >39)
LDL CALC: 81 mg/dL (ref 0–99)
Total CHOL/HDL Ratio: 2.1 Ratio
Triglycerides: 59 mg/dL (ref <150)
VLDL: 12 mg/dL (ref 0–40)

## 2014-05-27 LAB — TSH: TSH: 1.819 u[IU]/mL (ref 0.350–4.500)

## 2014-05-28 LAB — VITAMIN D 25 HYDROXY (VIT D DEFICIENCY, FRACTURES): VIT D 25 HYDROXY: 67 ng/mL (ref 30–89)

## 2014-05-29 NOTE — Telephone Encounter (Signed)
Called pt informing her of her lab.  Pt voiced understanding Encounter closed

## 2014-05-29 NOTE — Telephone Encounter (Signed)
Message copied by Gerda Diss on Wed May 29, 2014  9:01 AM ------      Message from: Kem Boroughs R      Created: Tue May 28, 2014  5:55 PM       Let patietn know that labs with CBC, Vit d, CMP and lipid panel are all normal ------

## 2014-09-30 ENCOUNTER — Encounter: Payer: Self-pay | Admitting: Nurse Practitioner

## 2015-02-10 ENCOUNTER — Other Ambulatory Visit: Payer: Self-pay

## 2015-02-10 DIAGNOSIS — Z1231 Encounter for screening mammogram for malignant neoplasm of breast: Secondary | ICD-10-CM

## 2015-04-01 ENCOUNTER — Ambulatory Visit: Payer: Self-pay

## 2015-04-01 ENCOUNTER — Telehealth: Payer: Self-pay | Admitting: Nurse Practitioner

## 2015-04-01 DIAGNOSIS — Z30433 Encounter for removal and reinsertion of intrauterine contraceptive device: Secondary | ICD-10-CM

## 2015-04-01 NOTE — Telephone Encounter (Signed)
Patient requesting to have her IUD removed and another one inserted at her AEX coming up 07/09/15 with Kem Boroughs, NP. She said she is due now. Please advise patient.

## 2015-04-01 NOTE — Telephone Encounter (Signed)
Pt had Mirena IUD placed 05/06/10 with Dr. [name]. This was a removal and reinsertion. Patient is G4P4. Advised patient IUD removal and replacement will need to be scheduled with an MD and will have to have two separate appointments one for procedure and one for annual exam with Samantha Cage, FNP. Patient has been amenorrheic with Mirena IUD.  Patient states she is training for a half marathon for 05/17/15. States she had a lot of cramping after last IUD placement and subsequently had pelvic ultrasound for evaluation which was WNL.  Patient would like to wait until after half marathon to have IUD removed and replaced. Advised will not have contraceptive coverage after 05/07/15 and advised back up method for birth control will be needed.  Patient then decides she would like to go ahead and schedule, however, she is going to be out of town week of 05/05/15. Multiple date and time combinations discussed with patient. She chooses 05/13/15 and appointment is scheduled with [name].   Pre-procedure instructions given. Motrin 800 mg PO one hour before appointment with food. Make sure to eat a meal and drink fluids prior to appointment.  Patient verbalized understanding of instructions.   [name], okay to order IUD removal and replacement and okay as scheduled?

## 2015-04-03 NOTE — Telephone Encounter (Signed)
Yes.  Ok to place IUD in this case without being on cycle.

## 2015-05-12 ENCOUNTER — Telehealth: Payer: Self-pay | Admitting: Nurse Practitioner

## 2015-05-12 ENCOUNTER — Ambulatory Visit: Payer: BC Managed Care – PPO | Admitting: Nurse Practitioner

## 2015-05-12 DIAGNOSIS — Z30433 Encounter for removal and reinsertion of intrauterine contraceptive device: Secondary | ICD-10-CM

## 2015-05-12 NOTE — Telephone Encounter (Signed)
Spoke with patient. Patient would like to reschedule IUD removal/reinsertion with Dr.Miller. Patient requesting a Thursday or Friday appointment. Appointment rescheduled for 7/1 at 3:30pm with Dr.Miller. Patient is agreeable to date and time. Please see telephone encounter from 04/01/2015. Patient aware will need to continue using BUM for contraception until IUD removal and reinsertion.   Routing to provider for final review. Patient agreeable to disposition. Will close encounter.   Patient aware provider will review message and nurse will return call if any additional advice or change of disposition.

## 2015-05-12 NOTE — Telephone Encounter (Signed)
Patient cancelled her Mirena removal and replacement appointment appointment 05/13/15. Patient is having transportation issues for her kids tomorrow. Patient would like to reschedule.

## 2015-05-13 ENCOUNTER — Ambulatory Visit: Payer: Self-pay | Admitting: Obstetrics & Gynecology

## 2015-05-30 ENCOUNTER — Ambulatory Visit (INDEPENDENT_AMBULATORY_CARE_PROVIDER_SITE_OTHER): Payer: BLUE CROSS/BLUE SHIELD | Admitting: Obstetrics & Gynecology

## 2015-05-30 ENCOUNTER — Encounter: Payer: Self-pay | Admitting: Obstetrics & Gynecology

## 2015-05-30 ENCOUNTER — Ambulatory Visit: Payer: Self-pay | Admitting: Obstetrics & Gynecology

## 2015-05-30 DIAGNOSIS — Z30433 Encounter for removal and reinsertion of intrauterine contraceptive device: Secondary | ICD-10-CM

## 2015-05-30 NOTE — Progress Notes (Signed)
44 y.o. [id number] Married Caucasian female presents for removal of prior Mirena IUD and re- insertion of Mirena. She is very satisfied with this birth control option and has no desire to change.  No STD concerns.  LMP:  No LMP recorded. Patient is not currently having periods (Reason: IUD).  Gen:  WNWF healthy female NAD Abdomen: soft, non-tender Groin:  no inguinal nodes palpated  Pelvic exam: Vulva:  normal Vagina:  normal vagina Cervix:  Non-tender, Negative CMT, no lesions or redness Uterus:  normal shape, position and consistency   After patient read information booklet and all questions were answered, informed consent was obtained.      Procedure:  Speculum inserted into vagina. Cervix visualized and cleansed with betadine solution X 3. Paracervical block placed:  yes, 1% Lidocaine, Lot [id number], exp 11/30/15.  8cc total used.   Tenaculum placed on cervix at 12 o'clock position.  IUD removed with one pull of IUD strings.  IUD discarded.  Uterus sounded to 7.5cm centimeters.  IUD and inserting device removed from sterile packet and under sterile conditions inserted to fundus of uterus.  IUD released and introducer removed without difficulty.  IUD string trimmed to 2 centimeters.  Remainder string given to patient to feel for identification.  Tenaculum removed.  Minimal bleeding noted.  Speculum removed.  Uterus palpated normal.  Patient tolerated procedure well.  Mirena IUD Lot #:[id number].  Exp: 6/18.  Package information attached to consent and scanned into EPIC.  A: removal of prior Mirena IUD and re-insertion of Mirena   P: Return to office 4-6 weeks for recheck.  She will do this when she sees Edman Circle on 07/08/14      Pt knows IUD needs to be replaced approximately 5 years, no later than 05/29/2020.  Instructions provided.

## 2015-06-05 ENCOUNTER — Other Ambulatory Visit: Payer: Self-pay | Admitting: Orthopedic Surgery

## 2015-06-05 DIAGNOSIS — M542 Cervicalgia: Secondary | ICD-10-CM

## 2015-06-10 ENCOUNTER — Ambulatory Visit
Admission: RE | Admit: 2015-06-10 | Discharge: 2015-06-10 | Disposition: A | Discharge status: Home / Self Care | Payer: BLUE CROSS/BLUE SHIELD | Source: Ambulatory Visit | Attending: Orthopedic Surgery | Admitting: Orthopedic Surgery

## 2015-06-10 DIAGNOSIS — M542 Cervicalgia: Secondary | ICD-10-CM

## 2015-06-17 ENCOUNTER — Ambulatory Visit
Admission: RE | Admit: 2015-06-17 | Discharge: 2015-06-17 | Disposition: A | Discharge status: Home / Self Care | Payer: BLUE CROSS/BLUE SHIELD | Source: Ambulatory Visit

## 2015-06-17 DIAGNOSIS — Z1231 Encounter for screening mammogram for malignant neoplasm of breast: Secondary | ICD-10-CM

## 2015-07-08 ENCOUNTER — Encounter: Payer: Self-pay | Admitting: *Deleted

## 2015-07-09 ENCOUNTER — Telehealth: Payer: Self-pay | Admitting: Nurse Practitioner

## 2015-07-09 ENCOUNTER — Ambulatory Visit (INDEPENDENT_AMBULATORY_CARE_PROVIDER_SITE_OTHER): Payer: BLUE CROSS/BLUE SHIELD | Admitting: Nurse Practitioner

## 2015-07-09 ENCOUNTER — Encounter: Payer: Self-pay | Admitting: Nurse Practitioner

## 2015-07-09 VITALS — BP 116/72 | HR 64 | Resp 14 | Ht 68.0 in | Wt 138.6 lb

## 2015-07-09 DIAGNOSIS — Z8041 Family history of malignant neoplasm of ovary: Secondary | ICD-10-CM | POA: Diagnosis not present

## 2015-07-09 DIAGNOSIS — Z01419 Encounter for gynecological examination (general) (routine) without abnormal findings: Secondary | ICD-10-CM | POA: Diagnosis not present

## 2015-07-09 DIAGNOSIS — Z0000 Encounter for general adult medical examination without abnormal findings: Secondary | ICD-10-CM | POA: Diagnosis not present

## 2015-07-09 LAB — POCT URINALYSIS DIPSTICK
Bilirubin, UA: NEGATIVE
Clarity, UA: NEGATIVE
GLUCOSE UA: NEGATIVE
Ketones, UA: NEGATIVE
Leukocytes, UA: NEGATIVE
NITRITE UA: NEGATIVE
PH UA: 8
PROTEIN UA: NEGATIVE
RBC UA: NEGATIVE
UROBILINOGEN UA: NEGATIVE

## 2015-07-09 NOTE — Patient Instructions (Signed)
General topics  Next pap or exam is  due in 1 year Take a Women's multivitamin Take 1200 mg. of calcium daily - prefer dietary If any concerns in interim to call back  Breast Self-Awareness Practicing breast self-awareness may pick up problems early, prevent significant medical complications, and possibly save your life. By practicing breast self-awareness, you can become familiar with how your breasts look and feel and if your breasts are changing. This allows you to notice changes early. It can also offer you some reassurance that your breast health is good. One way to learn what is normal for your breasts and whether your breasts are changing is to do a breast self-exam. If you find a lump or something that was not present in the past, it is best to contact your caregiver right away. Other findings that should be evaluated by your caregiver include nipple discharge, especially if it is bloody; skin changes or reddening; areas where the skin seems to be pulled in (retracted); or new lumps and bumps. Breast pain is seldom associated with cancer (malignancy), but should also be evaluated by a caregiver. BREAST SELF-EXAM The best time to examine your breasts is 5 7 days after your menstrual period is over.  ExitCare Patient Information 2013 [employer].   Exercise to Stay Healthy Exercise helps you become and stay healthy. EXERCISE IDEAS AND TIPS Choose exercises that:  You enjoy.  Fit into your day. You do not need to exercise really hard to be healthy. You can do exercises at a slow or medium level and stay healthy. You can:  Stretch before and after working out.  Try yoga, Pilates, or tai chi.  Lift weights.  Walk fast, swim, jog, run, climb stairs, bicycle, dance, or rollerskate.  Take aerobic classes. Exercises that burn about 150 calories:  Running 1  miles in 15 minutes.  Playing volleyball for 45 to 60 minutes.  Washing and waxing a car for 45 to 60  minutes.  Playing touch football for 45 minutes.  Walking 1  miles in 35 minutes.  Pushing a stroller 1  miles in 30 minutes.  Playing basketball for 30 minutes.  Raking leaves for 30 minutes.  Bicycling 5 miles in 30 minutes.  Walking 2 miles in 30 minutes.  Dancing for 30 minutes.  Shoveling snow for 15 minutes.  Swimming laps for 20 minutes.  Walking up stairs for 15 minutes.  Bicycling 4 miles in 15 minutes.  Gardening for 30 to 45 minutes.  Jumping rope for 15 minutes.  Washing windows or floors for 45 to 60 minutes. Document Released: 12/18/2010 Document Revised: 02/07/2012 Document Reviewed: 12/18/2010 ExitCare Patient Information 2013 [employer].   Other topics ( that may be useful information):    Sexually Transmitted Disease Sexually transmitted disease (STD) refers to any infection that is passed from person to person during sexual activity. This may happen by way of saliva, semen, blood, vaginal mucus, or urine. Common STDs include:  Gonorrhea.  Chlamydia.  Syphilis.  HIV/AIDS.  Genital herpes.  Hepatitis B and C.  Trichomonas.  Human papillomavirus (HPV).  Pubic lice. CAUSES  An STD may be spread by bacteria, virus, or parasite. A person can get an STD by:  Sexual intercourse with an infected person.  Sharing sex toys with an infected person.  Sharing needles with an infected person.  Having intimate contact with the genitals, mouth, or rectal areas of an infected person. SYMPTOMS  Some people may not have any symptoms, but   they can still pass the infection to others. Different STDs have different symptoms. Symptoms include:  Painful or bloody urination.  Pain in the pelvis, abdomen, vagina, anus, throat, or eyes.  Skin rash, itching, irritation, growths, or sores (lesions). These usually occur in the genital or anal area.  Abnormal vaginal discharge.  Penile discharge in men.  Soft, flesh-colored skin growths in the  genital or anal area.  Fever.  Pain or bleeding during sexual intercourse.  Swollen glands in the groin area.  Yellow skin and eyes (jaundice). This is seen with hepatitis. DIAGNOSIS  To make a diagnosis, your caregiver may:  Take a medical history.  Perform a physical exam.  Take a specimen (culture) to be examined.  Examine a sample of discharge under a microscope.  Perform blood test TREATMENT   Chlamydia, gonorrhea, trichomonas, and syphilis can be cured with antibiotic medicine.  Genital herpes, hepatitis, and HIV can be treated, but not cured, with prescribed medicines. The medicines will lessen the symptoms.  Genital warts from HPV can be treated with medicine or by freezing, burning (electrocautery), or surgery. Warts may come back.  HPV is a virus and cannot be cured with medicine or surgery.However, abnormal areas may be followed very closely by your caregiver and may be removed from the cervix, vagina, or vulva through office procedures or surgery. If your diagnosis is confirmed, your recent sexual partners need treatment. This is true even if they are symptom-free or have a negative culture or evaluation. They should not have sex until their caregiver says it is okay. HOME CARE INSTRUCTIONS  All sexual partners should be informed, tested, and treated for all STDs.  Take your antibiotics as directed. Finish them even if you start to feel better.  Only take over-the-counter or prescription medicines for pain, discomfort, or fever as directed by your caregiver.  Rest.  Eat a balanced diet and drink enough fluids to keep your urine clear or pale yellow.  Do not have sex until treatment is completed and you have followed up with your caregiver. STDs should be checked after treatment.  Keep all follow-up appointments, Pap tests, and blood tests as directed by your caregiver.  Only use latex condoms and water-soluble lubricants during sexual activity. Do not use  petroleum jelly or oils.  Avoid alcohol and illegal drugs.  Get vaccinated for HPV and hepatitis. If you have not received these vaccines in the past, talk to your caregiver about whether one or both might be right for you.  Avoid risky sex practices that can break the skin. The only way to avoid getting an STD is to avoid all sexual activity.Latex condoms and dental dams (for oral sex) will help lessen the risk of getting an STD, but will not completely eliminate the risk. SEEK MEDICAL CARE IF:   You have a fever.  You have any new or worsening symptoms. Document Released: 02/05/2003 Document Revised: 02/07/2012 Document Reviewed: 02/12/2011 [facility] Patient Information 2013 [employer].    Domestic Abuse You are being battered or abused if someone close to you hits, pushes, or physically hurts you in any way. You also are being abused if you are forced into activities. You are being sexually abused if you are forced to have sexual contact of any kind. You are being emotionally abused if you are made to feel worthless or if you are constantly threatened. It is important to remember that help is available. No one has the right to abuse you. PREVENTION OF FURTHER  ABUSE  Learn the warning signs of danger. This varies with situations but may include: the use of alcohol, threats, isolation from friends and family, or forced sexual contact. Leave if you feel that violence is going to occur.  If you are attacked or beaten, report it to the police so the abuse is documented. You do not have to press charges. The police can protect you while you or the attackers are leaving. Get the officer's name and badge number and a copy of the report.  Find someone you can trust and tell them what is happening to you: your caregiver, a nurse, clergy member, close friend or family member. Feeling ashamed is natural, but remember that you have done nothing wrong. No one deserves abuse. Document Released:  11/12/2000 Document Revised: 02/07/2012 Document Reviewed: 01/21/2011 ExitCare Patient Information 2013 [employer].    How Much is Too Much Alcohol? Drinking too much alcohol can cause injury, accidents, and health problems. These types of problems can include:   Car crashes.  Falls.  Family fighting (domestic violence).  Drowning.  Fights.  Injuries.  Burns.  Damage to certain organs.  Having a baby with birth defects. ONE DRINK CAN BE TOO MUCH WHEN YOU ARE:  Working.  Pregnant or breastfeeding.  Taking medicines. Ask your doctor.  Driving or planning to drive. If you or someone you know has a drinking problem, get help from a doctor.  Document Released: 09/11/2009 Document Revised: 02/07/2012 Document Reviewed: 09/11/2009 ExitCare Patient Information 2013 [employer].   Smoking Hazards Smoking cigarettes is extremely bad for your health. Tobacco smoke has over 200 known poisons in it. There are over 60 chemicals in tobacco smoke that cause cancer. Some of the chemicals found in cigarette smoke include:   Cyanide.  Benzene.  Formaldehyde.  Methanol (wood alcohol).  Acetylene (fuel used in welding torches).  Ammonia. Cigarette smoke also contains the poisonous gases nitrogen oxide and carbon monoxide.  Cigarette smokers have an increased risk of many serious medical problems and Smoking causes approximately:  90% of all lung cancer deaths in men.  80% of all lung cancer deaths in women.  90% of deaths from chronic obstructive lung disease. Compared with nonsmokers, smoking increases the risk of:  Coronary heart disease by 2 to 4 times.  Stroke by 2 to 4 times.  Men developing lung cancer by 23 times.  Women developing lung cancer by 13 times.  Dying from chronic obstructive lung diseases by 12 times.  . Smoking is the most preventable cause of death and disease in our society.  WHY IS SMOKING ADDICTIVE?  Nicotine is the chemical  agent in tobacco that is capable of causing addiction or dependence.  When you smoke and inhale, nicotine is absorbed rapidly into the bloodstream through your lungs. Nicotine absorbed through the lungs is capable of creating a powerful addiction. Both inhaled and non-inhaled nicotine may be addictive.  Addiction studies of cigarettes and spit tobacco show that addiction to nicotine occurs mainly during the teen years, when young people begin using tobacco products. WHAT ARE THE BENEFITS OF QUITTING?  There are many health benefits to quitting smoking.   Likelihood of developing cancer and heart disease decreases. Health improvements are seen almost immediately.  Blood pressure, pulse rate, and breathing patterns start returning to normal soon after quitting. QUITTING SMOKING   American Lung Association - 1-800-LUNGUSA  American Cancer Society - 1-800-ACS-2345 Document Released: 12/23/2004 Document Revised: 02/07/2012 Document Reviewed: 08/27/2009 ExitCare Patient Information 2013 ExitCare[employer]   LL[employer].   Stress Management Stress is a state of physical or mental tension that often results from changes in your life or normal routine. Some common causes of stress are:  Death of a loved one.  Injuries or severe illnesses.  Getting fired or changing jobs.  Moving into a new home. Other causes may be:  Sexual problems.  Business or financial losses.  Taking on a large debt.  Regular conflict with someone at home or at work.  Constant tiredness from lack of sleep. It is not just bad things that are stressful. It may be stressful to:  Win the lottery.  Get married.  Buy a new car. The amount of stress that can be easily tolerated varies from person to person. Changes generally cause stress, regardless of the types of change. Too much stress can affect your health. It may lead to physical or emotional problems. Too little stress (boredom) may also become stressful. SUGGESTIONS TO  REDUCE STRESS:  Talk things over with your family and friends. It often is helpful to share your concerns and worries. If you feel your problem is serious, you may want to get help from a professional counselor.  Consider your problems one at a time instead of lumping them all together. Trying to take care of everything at once may seem impossible. List all the things you need to do and then start with the most important one. Set a goal to accomplish 2 or 3 things each day. If you expect to do too many in a single day you will naturally fail, causing you to feel even more stressed.  Do not use alcohol or drugs to relieve stress. Although you may feel better for a short time, they do not remove the problems that caused the stress. They can also be habit forming.  Exercise regularly - at least 3 times per week. Physical exercise can help to relieve that "uptight" feeling and will relax you.  The shortest distance between despair and hope is often a good night's sleep.  Go to bed and get up on time allowing yourself time for appointments without being rushed.  Take a short "time-out" period from any stressful situation that occurs during the day. Close your eyes and take some deep breaths. Starting with the muscles in your face, tense them, hold it for a few seconds, then relax. Repeat this with the muscles in your neck, shoulders, hand, stomach, back and legs.  Take good care of yourself. Eat a balanced diet and get plenty of rest.  Schedule time for having fun. Take a break from your daily routine to relax. HOME CARE INSTRUCTIONS   Call if you feel overwhelmed by your problems and feel you can no longer manage them on your own.  Return immediately if you feel like hurting yourself or someone else. Document Released: 05/11/2001 Document Revised: 02/07/2012 Document Reviewed: 01/01/2008 ExitCare Patient Information 2013 [employer].  

## 2015-07-09 NOTE — Telephone Encounter (Signed)
Patient is informed that I have consulted with Dr. Quincy Simmonds and we would recommend a PUS to look for ovarian disease given her sisters history of OV cancer.  I will place the order and get insurance approval.  She will then be contacted.

## 2015-07-09 NOTE — Progress Notes (Signed)
44 y.o. [id number] Married  Caucasian Fe here for annual exam.  Just had Mirena changed on 05/30/2015.  No menses since last Mirena other than spotting after this insertion.  Sister age 51 diagnosed with OV cancer following evaluation for endometrioma and lysis of adhesions.  The final pathology was OV cancer that was contained.  Her outlook is good and her genetic testing was negative.   She had 6 courses of chemo and suggested that she have radiation but secondary to her history of adhesions she did not this.  Surgery was TAH/BSO. Patient is concerned about her risk factors.  Patient's last menstrual period was 05/06/2010.          Sexually active: Yes.    The current method of family planning is IUD- Mirena. Placement 05/30/2015 Exercising: Yes.    running and strength Smoker:  no  Health Maintenance: Pap: 03/31/2012 normal with neg HR HPV MMG:  06/17/15 Dense Category C, Bi-rads Category 1 neg. TDaP:  02/25/2011 Labs: Rheumatology  UA: neg, Ph 8.0    reports that she has never smoked. She has never used smokeless tobacco. She reports that she drinks about 3.6 - 4.2 oz of alcohol per week. She reports that she does not use illicit drugs.  Past Medical History  Diagnosis Date  . Arthritis 2003    psoriatic, Dr. Estanislado Pandy    Past Surgical History  Procedure Laterality Date  . Tonsillectomy and adenoidectomy  1990  . Wisdom tooth extraction Bilateral 1991  . Intrauterine device insertion  05/06/10    MIrena IUD    Current Outpatient Prescriptions  Medication Sig Dispense Refill  . etanercept (ENBREL) 25 MG injection Inject 25 mg into the skin once a week.    . fish oil-omega-3 fatty acids 1000 MG capsule Take 2 g by mouth daily.    Marland Kitchen levonorgestrel (MIRENA) 20 MCG/24HR IUD 1 each by Intrauterine route once. Mirena IUD.    Marland Kitchen vitamin D, CHOLECALCIFEROL, 400 UNITS tablet Take 400 Units by mouth daily.      Marland Kitchen VYVANSE 10 MG CAPS TAKE 1 CAPSULE BY MOUTH ONCE A DAY AT LUNCHTIME  0  . VYVANSE 50 MG  capsule Take 50 mg by mouth every morning.  0   No current facility-administered medications for this visit.    Family History  Problem Relation Age of Onset  . Hyperlipidemia Mother   . Ovarian cysts Mother     benign  . Endometriosis Mother   . Other Father     auto imune  . Coronary artery disease Father     cardio ablation  . Cancer Father   . Arthritis/Rheumatoid Sister 30    retinitis pigment diagnosed at 17  . Ulcerative colitis Sister 21    surgical repair J pouch  . COPD Maternal Grandmother   . Bone cancer Maternal Grandfather 71  . Ovarian cancer Sister 69  . Endometriosis Sister     older sister    ROS:  Pertinent items are noted in HPI.  Otherwise, a comprehensive ROS was negative.  Exam:   BP 116/72 mmHg  Pulse 64  Resp 14  Ht 5\' 8"  (1.727 m)  Wt 138 lb 9.6 oz (62.869 kg)  BMI 21.08 kg/m2  LMP 05/06/2010 Height: 5\' 8"  (172.7 cm) Ht Readings from Last 3 Encounters:  07/09/15 5\' 8"  (1.727 m)  05/02/14 5\' 8"  (1.727 m)  04/02/13 5\' 8"  (1.727 m)    General appearance: alert, cooperative and appears stated age Head: Normocephalic, without  obvious abnormality, atraumatic Neck: no adenopathy, supple, symmetrical, trachea midline and thyroid normal to inspection and palpation Lungs: clear to auscultation bilaterally Breasts: normal appearance, no masses or tenderness Heart: regular rate and rhythm Abdomen: soft, non-tender; no masses,  no organomegaly Extremities: extremities normal, atraumatic, no cyanosis or edema Skin: Skin color, texture, turgor normal. No rashes or lesions Lymph nodes: Cervical, supraclavicular, and axillary nodes normal. No abnormal inguinal nodes palpated Neurologic: Grossly normal   Pelvic: External genitalia:  no lesions              Urethra:  normal appearing urethra with no masses, tenderness or lesions              Bartholin's and Skene's: normal                 Vagina: normal appearing vagina with normal color and  discharge, no lesions              Cervix: anteverted IUD strings are visible              Pap taken: Yes.   Bimanual Exam:  Uterus:  normal size, contour, position, consistency, mobility, non-tender              Adnexa: no mass, fullness, tenderness               Rectovaginal: Confirms               Anus:  normal sphincter tone, no lesions  Chaperone present: Yes  A:  Well Woman with normal exam  Mirena IUD placement 05/06/10 with amenorrhea, new one inserted 05/30/2015 Psoriatic arthritis on Enbrel [facility]: sister with OV cancer age 41 - negative genetic testing  P:   Reviewed health and wellness pertinent to exam  Pap smear as above  Mammogram is due 05/2016  Counseled on breast self exam, mammography screening, adequate intake of calcium and vitamin D, diet and exercise return annually or prn  An After Visit Summary was printed and given to the patient.

## 2015-07-11 LAB — IPS PAP TEST WITH HPV

## 2015-07-15 NOTE — Progress Notes (Signed)
Encounter reviewed by Dr. Aundria Rud. Can do periodic ultrasounds for ovarian evaluation.

## 2015-08-08 ENCOUNTER — Telehealth: Payer: Self-pay | Admitting: Nurse Practitioner

## 2015-08-08 NOTE — Telephone Encounter (Signed)
Called patient to follow up on possible insurance changes based on previous attempt to verify benefits. Left voicemail for return call.

## 2015-08-18 NOTE — Telephone Encounter (Signed)
Called patient to review benefits for procedure. Left voicemail to call back and review. °

## 2015-08-22 NOTE — Telephone Encounter (Signed)
Spoke with patient to review benefit and schedule. Patient states she is "fine for now" and wishes to follow up in the spring. Patient declines to schedule at this time. Ok to close referral?

## 2015-08-25 NOTE — Telephone Encounter (Signed)
Yes OK to close chart.

## 2015-09-30 ENCOUNTER — Other Ambulatory Visit: Payer: BLUE CROSS/BLUE SHIELD | Admitting: Sports Medicine

## 2016-07-16 ENCOUNTER — Ambulatory Visit: Payer: BLUE CROSS/BLUE SHIELD | Admitting: Nurse Practitioner

## 2016-08-03 ENCOUNTER — Encounter: Payer: Self-pay | Admitting: Nurse Practitioner

## 2016-08-03 ENCOUNTER — Ambulatory Visit (INDEPENDENT_AMBULATORY_CARE_PROVIDER_SITE_OTHER): Payer: BLUE CROSS/BLUE SHIELD | Admitting: Nurse Practitioner

## 2016-08-03 VITALS — BP 108/66 | HR 64 | Ht 68.0 in | Wt 139.0 lb

## 2016-08-03 DIAGNOSIS — Z8041 Family history of malignant neoplasm of ovary: Secondary | ICD-10-CM | POA: Diagnosis not present

## 2016-08-03 DIAGNOSIS — Z0000 Encounter for general adult medical examination without abnormal findings: Secondary | ICD-10-CM | POA: Diagnosis not present

## 2016-08-03 DIAGNOSIS — Z01419 Encounter for gynecological examination (general) (routine) without abnormal findings: Secondary | ICD-10-CM

## 2016-08-03 LAB — CBC WITH DIFFERENTIAL/PLATELET
BASOS ABS: 0 {cells}/uL (ref 0–200)
Basophils Relative: 0 %
EOS ABS: 0 {cells}/uL — AB (ref 15–500)
EOS PCT: 0 %
HCT: 41.9 % (ref 35.0–45.0)
Hemoglobin: 13.7 g/dL (ref 11.7–15.5)
Lymphocytes Relative: 20 %
Lymphs Abs: 1340 cells/uL (ref 850–3900)
MCH: 31.1 pg (ref 27.0–33.0)
MCHC: 32.7 g/dL (ref 32.0–36.0)
MCV: 95.2 fL (ref 80.0–100.0)
MONOS PCT: 8 %
MPV: 10.9 fL (ref 7.5–12.5)
Monocytes Absolute: 536 cells/uL (ref 200–950)
NEUTROS ABS: 4824 {cells}/uL (ref 1500–7800)
Neutrophils Relative %: 72 %
PLATELETS: 201 10*3/uL (ref 140–400)
RBC: 4.4 MIL/uL (ref 3.80–5.10)
RDW: 12.5 % (ref 11.0–15.0)
WBC: 6.7 10*3/uL (ref 3.8–10.8)

## 2016-08-03 LAB — POCT URINALYSIS DIPSTICK
Bilirubin, UA: NEGATIVE
Glucose, UA: NEGATIVE
KETONES UA: NEGATIVE
LEUKOCYTES UA: NEGATIVE
NITRITE UA: NEGATIVE
PH UA: 7
PROTEIN UA: NEGATIVE
RBC UA: NEGATIVE
Urobilinogen, UA: NEGATIVE

## 2016-08-03 NOTE — Patient Instructions (Signed)

## 2016-08-03 NOTE — Progress Notes (Signed)
Patient ID: Samantha Graves, female   DOB: 02/05/1971, 45 y.o.   MRN: [id number]  45 y.o. [id number] Married  Caucasian Fe here for annual exam. amenorrhea on IUD.   This one was replaced on 05/30/2015.  No bleeding with this one.   Some low back pain that seems to be cyclic.  OTC NSAID's with relief and symptoms in 2 days.  No vaso symptoms.  Sister is doing well with her OV cancer since surgery.  She did take chemo but no radiation.  Patient's last menstrual period was 05/06/2010 (exact date).          Sexually active: Yes.    The current method of family planning is IUD.  Mirena inserted 05/30/15. Exercising: Yes.   Running and strength training almost everyday, training for [employer] Smoker:  no  Health Maintenance: Pap: 07/09/15, Negative with negative HR HPV  MMG:  06/17/15, 3D, Bi-Rads 1:  Negative (not scheduled as of 08/03/16) TDaP: 02/25/2011  HIV: 1999 with pregnancy Labs: PCP/rheumatology  Urine: Negative   reports that she has never smoked. She has never used smokeless tobacco. She reports that she drinks about 3.0 - 4.2 oz of alcohol per week . She reports that she does not use drugs.  Past Medical History:  Diagnosis Date  . Arthritis 2003   psoriatic, Dr. Estanislado Pandy    Past Surgical History:  Procedure Laterality Date  . INTRAUTERINE DEVICE INSERTION  05/30/15, 05/06/10   MIrena IUD  . TONSILLECTOMY AND ADENOIDECTOMY  1990  . WISDOM TOOTH EXTRACTION Bilateral 1991    Current Outpatient Prescriptions  Medication Sig Dispense Refill  . Adalimumab (HUMIRA) 40 MG/0.8ML PSKT Inject 40 mg into the skin every 14 (fourteen) days.    . fish oil-omega-3 fatty acids 1000 MG capsule Take 2 g by mouth daily.    Marland Kitchen levonorgestrel (MIRENA) 20 MCG/24HR IUD 1 each by Intrauterine route once. Mirena IUD.    Marland Kitchen vitamin D, CHOLECALCIFEROL, 400 UNITS tablet Take 400 Units by mouth daily.      Marland Kitchen VYVANSE 10 MG CAPS TAKE 1 CAPSULE BY MOUTH ONCE A DAY AT LUNCHTIME  0  . VYVANSE 50 MG capsule Take  50 mg by mouth every morning.  0   No current facility-administered medications for this visit.     Family History  Problem Relation Age of Onset  . Hyperlipidemia Mother   . Ovarian cysts Mother     benign  . Endometriosis Mother   . Other Father     auto imune  . Coronary artery disease Father     cardio ablation  . COPD Maternal Grandmother   . Bone cancer Maternal Grandfather 20  . Arthritis/Rheumatoid Sister 36  . Endometriosis Sister   . Retinitis pigmentosa Sister 87  . Ulcerative colitis Sister 21    surgical repair, J pouch  . Ovarian cancer Sister 26    negative genetic testing    ROS:  Pertinent items are noted in HPI.  Otherwise, a comprehensive ROS was negative.  Exam:   BP 108/66 (BP Location: Right Arm, Patient Position: Sitting, Cuff Size: Normal)   Pulse 64   Ht 5\' 8"  (1.727 m) Comment: measured  Wt 139 lb (63 kg)   LMP 05/06/2010 (Exact Date)   BMI 21.13 kg/m  Height: 5\' 8"  (172.7 cm) (measured) Ht Readings from Last 3 Encounters:  08/03/16 5\' 8"  (1.727 m)  07/09/15 5\' 8"  (1.727 m)  05/02/14 5\' 8"  (1.727 m)    General appearance:  alert, cooperative and appears stated age Head: Normocephalic, without obvious abnormality, atraumatic Neck: no adenopathy, supple, symmetrical, trachea midline and thyroid normal to inspection and palpation Lungs: clear to auscultation bilaterally Breasts: normal appearance, no masses or tenderness Heart: regular rate and rhythm Abdomen: soft, non-tender; no masses,  no organomegaly Extremities: extremities normal, atraumatic, no cyanosis or edema Skin: Skin color, texture, turgor normal. No rashes or lesions Lymph nodes: Cervical, supraclavicular, and axillary nodes normal. No abnormal inguinal nodes palpated Neurologic: Grossly normal   Pelvic: External genitalia:  no lesions              Urethra:  normal appearing urethra with no masses, tenderness or lesions              Bartholin's and Skene's: normal                  Vagina: normal appearing vagina with normal color and discharge, no lesions              Cervix: anteverted  IUD string is visible              Pap taken: No. Bimanual Exam:  Uterus:  normal size, contour, position, consistency, mobility, non-tender              Adnexa: no mass, fullness, tenderness               Rectovaginal: Confirms               Anus:  normal sphincter tone, no lesions  Chaperone present: yes  A:  Well Woman with normal exam  Mirena IUD placement 05/06/10 with amenorrhea, new one inserted 05/30/2015 Psoriatic arthritis on Enbrel FMH: sister with OV cancer age 76 - negative genetic testing  P:   Reviewed health and wellness pertinent to exam  Pap smear not done  Mammogram is due now and will schedule  Follow with labs and a copy to rheumatologist.  Counseled on breast self exam, mammography screening, adequate intake of calcium and vitamin D, diet and exercise return annually or prn  An After Visit Summary was printed and given to the patient.  Send copy of labs to Dr. Estanislado Pandy

## 2016-08-04 LAB — LIPID PANEL
Cholesterol: 178 mg/dL (ref 125–200)
HDL: 110 mg/dL (ref >=46)
LDL Cholesterol: 58 mg/dL (ref <130)
TRIGLYCERIDES: 50 mg/dL (ref <150)
Total CHOL/HDL Ratio: 1.6 Ratio (ref <=5.0)
VLDL: 10 mg/dL (ref <30)

## 2016-08-04 LAB — COMPREHENSIVE METABOLIC PANEL
ALK PHOS: 41 U/L (ref 33–115)
ALT: 62 U/L — AB (ref 6–29)
AST: 61 U/L — AB (ref 10–30)
Albumin: 4.4 g/dL (ref 3.6–5.1)
BILIRUBIN TOTAL: 0.7 mg/dL (ref 0.2–1.2)
BUN: 15 mg/dL (ref 7–25)
CALCIUM: 9.8 mg/dL (ref 8.6–10.2)
CO2: 26 mmol/L (ref 20–31)
CREATININE: 0.86 mg/dL (ref 0.50–1.10)
Chloride: 101 mmol/L (ref 98–110)
GLUCOSE: 87 mg/dL (ref 65–99)
Potassium: 4.7 mmol/L (ref 3.5–5.3)
SODIUM: 140 mmol/L (ref 135–146)
Total Protein: 6.7 g/dL (ref 6.1–8.1)

## 2016-08-04 LAB — TSH: TSH: 1.56 m[IU]/L

## 2016-08-04 LAB — VITAMIN D 25 HYDROXY (VIT D DEFICIENCY, FRACTURES): Vit D, 25-Hydroxy: 68 ng/mL (ref 30–100)

## 2016-08-04 NOTE — Progress Notes (Signed)
Encounter reviewed by Dr. Shameeka Silliman Amundson C. Silva.  

## 2016-09-04 ENCOUNTER — Other Ambulatory Visit: Payer: Self-pay | Admitting: Radiology

## 2016-09-04 DIAGNOSIS — Z79899 Other long term (current) drug therapy: Secondary | ICD-10-CM

## 2016-09-15 ENCOUNTER — Ambulatory Visit (INDEPENDENT_AMBULATORY_CARE_PROVIDER_SITE_OTHER): Payer: BLUE CROSS/BLUE SHIELD | Admitting: Rheumatology

## 2016-09-15 DIAGNOSIS — M25641 Stiffness of right hand, not elsewhere classified: Secondary | ICD-10-CM | POA: Diagnosis not present

## 2016-09-15 DIAGNOSIS — M7072 Other bursitis of hip, left hip: Secondary | ICD-10-CM | POA: Diagnosis not present

## 2016-09-15 DIAGNOSIS — M25642 Stiffness of left hand, not elsewhere classified: Secondary | ICD-10-CM

## 2016-10-26 ENCOUNTER — Telehealth: Payer: Self-pay | Admitting: Pharmacist

## 2016-10-26 NOTE — Telephone Encounter (Signed)
Received letter from [employer] company regarding underuse of Humira: "This patient may be taking their biologic medication differently than we expected based on our pharmacy claims record.  There could be many reasons for this.  If you have given this patient new instructions for use, please consider providing a new prescription to reflect this update.  You may have discontinued the medicine or changed the patient to an alternative.  If you haven't made a change to this patient's biologic therapy, your patient may be having difficulty due to side effects, cost, schedules or other reasons.  Please talk to your patient about the importance of adherence to their medicines and ways you can help them address individual barriers to adherence."  Most recent refill was on 09/06/16.    Called patient to discuss.  Patient reports she had to hold her medication for a couple of weeks due to being sick.  She reports her refill was delayed since she had extra medication, but she recently refilled her prescription last week.  Patient reports adherence with her medication and denied any medication related questions.    Elisabeth Most, Pharm.D., BCPS Clinical Pharmacist Pager: [phone] Phone: [phone] 10/26/2016 9:27 AM

## 2016-12-01 ENCOUNTER — Other Ambulatory Visit: Payer: Self-pay | Admitting: Rheumatology

## 2016-12-02 NOTE — Telephone Encounter (Signed)
Last Visit: 08/25/16 Next Visit: 01/17/17 Labs: 08/31/16 LFTs Elevated. Patient reminded she needs to update labs this month.  TB Gold: 08/31/16 Neg  Okay to refill Humira?

## 2016-12-02 NOTE — Telephone Encounter (Signed)
ok 

## 2017-01-04 ENCOUNTER — Other Ambulatory Visit: Payer: Self-pay | Admitting: *Deleted

## 2017-01-04 DIAGNOSIS — Z79899 Other long term (current) drug therapy: Secondary | ICD-10-CM

## 2017-01-04 LAB — CBC WITH DIFFERENTIAL/PLATELET
BASOS ABS: 0 {cells}/uL (ref 0–200)
Basophils Relative: 0 %
EOS PCT: 0 %
Eosinophils Absolute: 0 cells/uL — ABNORMAL LOW (ref 15–500)
HCT: 41.5 % (ref 35.0–45.0)
Hemoglobin: 13.8 g/dL (ref 11.7–15.5)
LYMPHS PCT: 23 %
Lymphs Abs: 1633 cells/uL (ref 850–3900)
MCH: 31.7 pg (ref 27.0–33.0)
MCHC: 33.3 g/dL (ref 32.0–36.0)
MCV: 95.4 fL (ref 80.0–100.0)
MPV: 10.7 fL (ref 7.5–12.5)
Monocytes Absolute: 568 cells/uL (ref 200–950)
Monocytes Relative: 8 %
NEUTROS ABS: 4899 {cells}/uL (ref 1500–7800)
Neutrophils Relative %: 69 %
PLATELETS: 168 10*3/uL (ref 140–400)
RBC: 4.35 MIL/uL (ref 3.80–5.10)
RDW: 12 % (ref 11.0–15.0)
WBC: 7.1 10*3/uL (ref 3.8–10.8)

## 2017-01-04 LAB — COMPLETE METABOLIC PANEL WITH GFR
ALT: 64 U/L — AB (ref 6–29)
AST: 57 U/L — AB (ref 10–35)
Albumin: 4.3 g/dL (ref 3.6–5.1)
Alkaline Phosphatase: 34 U/L (ref 33–115)
BUN: 18 mg/dL (ref 7–25)
CHLORIDE: 104 mmol/L (ref 98–110)
CO2: 29 mmol/L (ref 20–31)
Calcium: 9.5 mg/dL (ref 8.6–10.2)
Creat: 0.86 mg/dL (ref 0.50–1.10)
GFR, Est Non African American: 82 mL/min (ref >=60)
Glucose, Bld: 107 mg/dL — ABNORMAL HIGH (ref 65–99)
Potassium: 4.2 mmol/L (ref 3.5–5.3)
Sodium: 140 mmol/L (ref 135–146)
Total Bilirubin: 0.5 mg/dL (ref 0.2–1.2)
Total Protein: 6.4 g/dL (ref 6.1–8.1)

## 2017-01-11 DIAGNOSIS — L409 Psoriasis, unspecified: Secondary | ICD-10-CM | POA: Insufficient documentation

## 2017-01-11 DIAGNOSIS — Z79899 Other long term (current) drug therapy: Secondary | ICD-10-CM | POA: Insufficient documentation

## 2017-01-11 NOTE — Progress Notes (Signed)
Office Visit Note  Patient: Samantha Graves             Date of Birth: [birth year]           MRN: [id number]             PCP: No PCP Per Patient Referring: No ref. provider found Visit Date: 01/17/2017 Occupation: '@GUAROCC' @    Subjective:  Follow-up Follow-up on psoriatic arthritis and psoriasis and iris prescription  History of Present Illness: Samantha Graves is a 46 y.o. female  Doing well with her psoriatic arthritis and psoriasis. No complaint. Patient did say that there was one time when she was using Humira and it missed fired. She knows she got no medication in her system. She states that the plunger looked like he was going down and indicated that and when the plunger was finished she called the Humira pen out and then medicine squirted everywhere from the pen. Patient has had Enbrel prior to the Humira and she does state that she might need retraining with the Humira pen. I advised the patient to make an appointment with the nurse so that we can make sure that her technique is proper.  Otherwise she is doing well with the Humira.  She is complaining about left greater trochanter bursa pain once again. Patient is a avid runner and she does run marathons. I suspect that her running is having an impact on her left greater trochanter bursa flares. She will speak with her physical therapist, Dr. Barbaraann Barthel, to see if there is any additional exercises she can do to minimize flares. She is ordered a tried Voltaren gel with no relief.  She had this similar pain back in October 2017 and was injected with cortisone. She is requesting another injection today.  Patient also has had labs done and they're up-to-date and normal except history of elevated liver functions. She does drink a glass of wine every night.  Currently her liver functions are about 2 times the upper range of normal and we will just monitor.     Activities of Daily Living:  Patient reports morning  stiffness for 15 minutes.   Patient Denies nocturnal pain. Except left greater trochanter bursa pain. Difficulty dressing/grooming: Denies Difficulty climbing stairs: Denies Difficulty getting out of chair: Denies Difficulty using hands for taps, buttons, cutlery, and/or writing: Denies     Review of Systems  Constitutional: Negative for fatigue.  HENT: Negative for mouth sores and mouth dryness.   Eyes: Negative for dryness.  Respiratory: Negative for shortness of breath.   Gastrointestinal: Negative for constipation and diarrhea.  Musculoskeletal: Negative for myalgias and myalgias.  Skin: Negative for sensitivity to sunlight.  Psychiatric/Behavioral: Negative for decreased concentration and sleep disturbance.    PMFS History:  Patient Active Problem List   Diagnosis Date Noted  . Psoriasis 01/11/2017  . High risk medication use 01/11/2017  . PSORIATIC ARTHRITIS 10/14/2009  . ALLERGIC RHINITIS 09/06/2007    Past Medical History:  Diagnosis Date  . Arthritis 2003   psoriatic, Dr. Estanislado Pandy    Family History  Problem Relation Age of Onset  . Hyperlipidemia Mother   . Ovarian cysts Mother     benign  . Endometriosis Mother   . Other Father     auto imune  . Coronary artery disease Father     cardio ablation  . COPD Maternal Grandmother   . Bone cancer Maternal Grandfather 20  . Arthritis/Rheumatoid Sister 70  . Endometriosis Sister   .  Retinitis pigmentosa Sister 98  . Ulcerative colitis Sister 21    surgical repair, J pouch  . Ovarian cancer Sister 64    negative genetic testing   Past Surgical History:  Procedure Laterality Date  . INTRAUTERINE DEVICE INSERTION  05/30/15, 05/06/10   MIrena IUD  . TONSILLECTOMY AND ADENOIDECTOMY  1990  . WISDOM TOOTH EXTRACTION Bilateral 1991   Social History   Social History Narrative  . No narrative on file     Objective: Vital Signs: BP 120/73   Pulse 70   Resp 12   Ht 5' 7.5" (1.715 m)   Wt 147 lb (66.7 kg)    BMI 22.68 kg/m    Physical Exam  Constitutional: She is oriented to person, place, and time. She appears well-developed and well-nourished.  HENT:  Head: Normocephalic and atraumatic.  Eyes: EOM are normal. Pupils are equal, round, and reactive to light.  Cardiovascular: Normal rate, regular rhythm and normal heart sounds.  Exam reveals no gallop and no friction rub.   No murmur heard. Pulmonary/Chest: Effort normal and breath sounds normal. She has no wheezes. She has no rales.  Abdominal: Soft. Bowel sounds are normal. She exhibits no distension. There is no tenderness. There is no guarding. No hernia.  Musculoskeletal: Normal range of motion. She exhibits no edema, tenderness or deformity.  Lymphadenopathy:    She has no cervical adenopathy.  Neurological: She is alert and oriented to person, place, and time. Coordination normal.  Skin: Skin is warm and dry. Capillary refill takes less than 2 seconds. No rash noted.  Psychiatric: She has a normal mood and affect. Her behavior is normal.  Nursing note and vitals reviewed.    Musculoskeletal Exam:  Full range of motion of all joints Grip strength is equal and strong bilaterally Fiber myalgia tender points are all absent except left greater trochanter bursa painful.  CDAI Exam: No CDAI exam completed.    Investigation: Findings:  Labs from 08/03/2016 show urinalysis is negative.  Cholesterol panel is negative.  TSH is normal.  Vitamin D is normal at 68.  CBC with diff is normal.  CMP is within normal limits except for elevated AST and ALT at 61 and 62 respectively.   Labs from 03/05/2016 show CMP with GFR is normal.  CBC with diff is normal    10/14/2015 -X-rays of bilateral hands 2 views versus 02/2012 and no change versus 02/2012 shows bilateral minimal CMC, PIP and DIP narrowing.  Right 2nd and 3rd MCP with erosive changes but no change versus 02/2012.  It was also noted then and left 5th MCP with erosive changes consistent  with what was there on 02/2012.  Again, no change versus 02/2012.  Patient is doing well with the Enbrel treatments.   Bilateral feet 2 views versus 02/2012 shows no change versus 02/2012.  Bilateral DIP and PIP joint bilateral calcaneal spurs ongoing.   Bilateral hip versus 01/2010 and 03/21/2012 shows SI sclerosing, otherwise normal exam and no change versus 2013 and 2011.       Orders Only on 01/04/2017  Component Date Value Ref Range Status  . WBC 01/04/2017 7.1  3.8 - 10.8 K/uL Final  . RBC 01/04/2017 4.35  3.80 - 5.10 MIL/uL Final  . Hemoglobin 01/04/2017 13.8  11.7 - 15.5 g/dL Final  . HCT 01/04/2017 41.5  35.0 - 45.0 % Final  . MCV 01/04/2017 95.4  80.0 - 100.0 fL Final  . MCH 01/04/2017 31.7  27.0 - 33.0 pg Final  .  MCHC 01/04/2017 33.3  32.0 - 36.0 g/dL Final  . RDW 01/04/2017 12.0  11.0 - 15.0 % Final  . Platelets 01/04/2017 168  140 - 400 K/uL Final  . MPV 01/04/2017 10.7  7.5 - 12.5 fL Final  . Neutro Abs 01/04/2017 4899  1,500 - 7,800 cells/uL Final  . Lymphs Abs 01/04/2017 1633  850 - 3,900 cells/uL Final  . Monocytes Absolute 01/04/2017 568  200 - 950 cells/uL Final  . Eosinophils Absolute 01/04/2017 0* 15 - 500 cells/uL Final  . Basophils Absolute 01/04/2017 0  0 - 200 cells/uL Final  . Neutrophils Relative % 01/04/2017 69  % Final  . Lymphocytes Relative 01/04/2017 23  % Final  . Monocytes Relative 01/04/2017 8  % Final  . Eosinophils Relative 01/04/2017 0  % Final  . Basophils Relative 01/04/2017 0  % Final  . Smear Review 01/04/2017 Criteria for review not met   Final  . Sodium 01/04/2017 140  135 - 146 mmol/L Final  . Potassium 01/04/2017 4.2  3.5 - 5.3 mmol/L Final  . Chloride 01/04/2017 104  98 - 110 mmol/L Final  . CO2 01/04/2017 29  20 - 31 mmol/L Final  . Glucose, Bld 01/04/2017 107* 65 - 99 mg/dL Final  . BUN 01/04/2017 18  7 - 25 mg/dL Final  . Creat 01/04/2017 0.86  0.50 - 1.10 mg/dL Final  . Total Bilirubin 01/04/2017 0.5  0.2 - 1.2 mg/dL Final    . Alkaline Phosphatase 01/04/2017 34  33 - 115 U/L Final  . AST 01/04/2017 57* 10 - 35 U/L Final  . ALT 01/04/2017 64* 6 - 29 U/L Final  . Total Protein 01/04/2017 6.4  6.1 - 8.1 g/dL Final  . Albumin 01/04/2017 4.3  3.6 - 5.1 g/dL Final  . Calcium 01/04/2017 9.5  8.6 - 10.2 mg/dL Final  . GFR, Est African American 01/04/2017 >89  >=60 mL/min Final  . GFR, Est Non African American 01/04/2017 82  >=60 mL/min Final      Imaging: No results found.  Speciality Comments: No specialty comments available.    Procedures:  Large Joint Inj Date/Time: 01/17/2017 11:19 AM Performed by: Eliezer Lofts Authorized by: Eliezer Lofts   Consent Given by:  Patient Site marked: the procedure site was marked   Timeout: prior to procedure the correct patient, procedure, and site was verified   Indications:  Pain Location:  Hip Site:  L greater trochanter Prep: patient was prepped and draped in usual sterile fashion   Needle Size:  27 G Approach:  Superior Ultrasound Guidance: No   Fluoroscopic Guidance: No   Arthrogram: No   Medications:  1.5 mL lidocaine 1 %; 40 mg triamcinolone acetonide 40 MG/ML Aspiration Attempted: Yes   Aspirate amount (mL):  0 Patient tolerance:  Patient tolerated the procedure well with no immediate complications  Patient is having pain again in the left greater trochanter bursa. In October 2017 visit, it was injected. She wants another injection today. Although we will give the injection today, I advised the patient the importance of minimizing those injections when possible. She sees Dr. Barbaraann Barthel, physical therapist, and I've asked to have the patient talked to him to see if there is any additional exercise can do to minimize flaring. Patient is agreeable.  After informed consent was obtained, the site was prepped in usual fashion, injected with one half mL One percent lidocaine mixed with 40 mg of Kenalog. Patient tolerated procedure well. There are  no competitions  Allergies: Tamiflu [oseltamivir phosphate]   Assessment / Plan:     Visit Diagnoses: PSORIATIC ARTHRITIS  Psoriasis  High risk medication use   Plan: #1: Psoriatic arthritis and psoriasis. Currently doing well with Humira. She does have a flare on occasion and she gets mild psoriasis in her scalp.  #2: High risk prescription Patient is on Humira and doing really well.  #3: Left greater trochanter bursa pain once again. Patient received an injection of left greater trochanter bursa in October 2017. She is requesting another injection today. I'm happy to give this injection but we discussed too many injections into the same place is not recommended. Patient will discuss with Dr. Barbaraann Barthel additional physical therapy that can affect to be done to minimize flares in the future. Patient is 144 pound runner and runs marathons frequently. I suspect that is having an impact on her left greater trochanter bursa.  #4: Return to clinic in 5 months  Orders: Orders Placed This Encounter  Procedures  . Large Joint Injection/Arthrocentesis   No orders of the defined types were placed in this encounter.   Face-to-face time spent with patient was 30 minutes. 50% of time was spent in counseling and coordination of care.  Follow-Up Instructions: Return in about 5 months (around 06/16/2017) for PsA,Ps,humira,left gr tr bursitis.   Eliezer Lofts, PA-C  Note - This record has been created using [employer].  Chart creation errors have been sought, but may not always  have been located. Such creation errors do not reflect on  the standard of medical care.

## 2017-01-17 ENCOUNTER — Ambulatory Visit (INDEPENDENT_AMBULATORY_CARE_PROVIDER_SITE_OTHER): Payer: BLUE CROSS/BLUE SHIELD | Admitting: Rheumatology

## 2017-01-17 ENCOUNTER — Encounter: Payer: Self-pay | Admitting: Rheumatology

## 2017-01-17 VITALS — BP 120/73 | HR 70 | Resp 12 | Ht 67.5 in | Wt 147.0 lb

## 2017-01-17 DIAGNOSIS — Z79899 Other long term (current) drug therapy: Secondary | ICD-10-CM | POA: Diagnosis not present

## 2017-01-17 DIAGNOSIS — M7062 Trochanteric bursitis, left hip: Secondary | ICD-10-CM | POA: Diagnosis not present

## 2017-01-17 DIAGNOSIS — L4050 Arthropathic psoriasis, unspecified: Secondary | ICD-10-CM | POA: Diagnosis not present

## 2017-01-17 DIAGNOSIS — L409 Psoriasis, unspecified: Secondary | ICD-10-CM

## 2017-01-17 MED ORDER — TRIAMCINOLONE ACETONIDE 40 MG/ML IJ SUSP
40.0000 mg | INTRAMUSCULAR | Status: AC | PRN
  Administered 2017-01-17: 40 mg via INTRA_ARTICULAR

## 2017-01-17 MED ORDER — LIDOCAINE HCL 1 % IJ SOLN
1.5000 mL | INTRAMUSCULAR | Status: AC | PRN
  Administered 2017-01-17: 1.5 mL

## 2017-01-25 ENCOUNTER — Telehealth: Payer: Self-pay | Admitting: Pharmacist

## 2017-01-25 NOTE — Telephone Encounter (Signed)
Received letter from [employer] company regarding underuse of Humira: "Based on pharmacy claims, this patient may be non-adherent to their biologic medication.  Reasons for non-adherence may include: side effects, affordability, or changes in the drug regimen.  Please consider providing a new prescription if the regimen has changed and/or discuss the importance or adherence and ways to address barriers with your patient."  Noted refill was sent to the pharmacy on 12/02/16 for a 90-day supply.  Per letter, the most recent refill was on 12/28/16.    Called patient to discuss.  Patient reports adherence to Humira and denies any concerns with the medication.  She confirms she had an extra pen from where she had to skip a dose in December due to being sick.  She said she is on her last pen and plans to refill the medication next week.  Patient denies any questions regarding her medications at this time.   Elisabeth Most, Pharm.D., BCPS, CPP Clinical Pharmacist Pager: [phone] Phone: [phone] 01/25/2017 11:02 AM

## 2017-02-21 ENCOUNTER — Other Ambulatory Visit: Payer: Self-pay | Admitting: *Deleted

## 2017-02-21 MED ORDER — ADALIMUMAB 40 MG/0.8ML ~~LOC~~ PSKT: 40.0000 mg | PREFILLED_SYRINGE | SUBCUTANEOUS | 0 refills | Status: DC

## 2017-02-21 NOTE — Telephone Encounter (Signed)
Refill request received via fax  Last Visit: 01/17/17 Next Visit: 06/16/17 Labs: 01/04/17 ALT 64 AST 57  TB Gold: 08/31/16 Neg  Okay to refill Humira?

## 2017-05-19 ENCOUNTER — Other Ambulatory Visit: Payer: Self-pay | Admitting: Rheumatology

## 2017-05-19 MED ORDER — ADALIMUMAB 40 MG/0.8ML ~~LOC~~ PSKT: 40.0000 mg | PREFILLED_SYRINGE | SUBCUTANEOUS | 0 refills | Status: DC

## 2017-05-19 NOTE — Telephone Encounter (Signed)
30d

## 2017-05-19 NOTE — Telephone Encounter (Signed)
Last Visit: 01/17/17 Next Visit: 06/16/17 Labs: 01/04/17 C/ W previous labs TB Gold: 08/31/16 Neg  Left message to advise patient she is due to update labs.   Okay to refill Humira?

## 2017-05-19 NOTE — Telephone Encounter (Signed)
[facility] called this morning in regards to a refill for the patient's Humira 40 mg.  [phone], fax [phone].  Thank you.

## 2017-06-13 ENCOUNTER — Telehealth: Payer: Self-pay | Admitting: Obstetrics and Gynecology

## 2017-06-13 NOTE — Telephone Encounter (Signed)
LMTCB/:NP/ .CX/LETTER SENT/RD

## 2017-06-14 ENCOUNTER — Telehealth: Payer: Self-pay | Admitting: Pharmacist

## 2017-06-14 NOTE — Telephone Encounter (Signed)
Received message from [facility] regarding patient's Humira stating "Anfer numerous unsuccessful attempts to contact the patient, we are now discontinuing the patient's prescription.  Please have the patient call us at [phone] if they would like to reactivate the prescription."    I attempted to call patient to discuss her Humira.  I left a message asking her to call me back.   Elisabeth Most, Pharm.D., BCPS, CPP Clinical Pharmacist Pager: [phone] Phone: [phone] 06/14/2017 3:45 PM

## 2017-06-16 ENCOUNTER — Ambulatory Visit: Payer: BLUE CROSS/BLUE SHIELD | Admitting: Rheumatology

## 2017-06-24 ENCOUNTER — Encounter: Payer: Self-pay | Admitting: *Deleted

## 2017-06-24 NOTE — Telephone Encounter (Signed)
Letter mailed to patient.

## 2017-06-24 NOTE — Telephone Encounter (Signed)
I have made multiple attempts to reach patient to discuss letter from [facility] about trouble reaching patient to refill Humira.  Also patient is due for follow up appointment (appointment on 06/16/17 was cancelled).  Can you send a letter to patient asking her to schedule follow up?     Samantha Graves, Pharm.D., BCPS, CPP Clinical Pharmacist Pager: [phone] Phone: [phone] 06/24/2017 9:04 AM

## 2017-07-20 ENCOUNTER — Other Ambulatory Visit: Payer: Self-pay | Admitting: *Deleted

## 2017-07-20 DIAGNOSIS — Z79899 Other long term (current) drug therapy: Secondary | ICD-10-CM

## 2017-07-20 LAB — CBC WITH DIFFERENTIAL/PLATELET
BASOS PCT: 0 %
Basophils Absolute: 0 cells/uL (ref 0–200)
EOS PCT: 0 %
Eosinophils Absolute: 0 cells/uL — ABNORMAL LOW (ref 15–500)
HEMATOCRIT: 39.7 % (ref 35.0–45.0)
HEMOGLOBIN: 13.3 g/dL (ref 11.7–15.5)
LYMPHS ABS: 1404 {cells}/uL (ref 850–3900)
Lymphocytes Relative: 27 %
MCH: 32 pg (ref 27.0–33.0)
MCHC: 33.5 g/dL (ref 32.0–36.0)
MCV: 95.7 fL (ref 80.0–100.0)
MONO ABS: 416 {cells}/uL (ref 200–950)
MPV: 10.5 fL (ref 7.5–12.5)
Monocytes Relative: 8 %
Neutro Abs: 3380 cells/uL (ref 1500–7800)
Neutrophils Relative %: 65 %
Platelets: 180 10*3/uL (ref 140–400)
RBC: 4.15 MIL/uL (ref 3.80–5.10)
RDW: 12.4 % (ref 11.0–15.0)
WBC: 5.2 10*3/uL (ref 3.8–10.8)

## 2017-07-21 LAB — COMPLETE METABOLIC PANEL WITH GFR
ALT: 28 U/L (ref 6–29)
AST: 32 U/L (ref 10–35)
Albumin: 4.3 g/dL (ref 3.6–5.1)
Alkaline Phosphatase: 35 U/L (ref 33–115)
BILIRUBIN TOTAL: 0.7 mg/dL (ref 0.2–1.2)
BUN: 19 mg/dL (ref 7–25)
CO2: 25 mmol/L (ref 20–32)
CREATININE: 0.94 mg/dL (ref 0.50–1.10)
Calcium: 9.7 mg/dL (ref 8.6–10.2)
Chloride: 102 mmol/L (ref 98–110)
GFR, Est African American: 85 mL/min (ref >=60)
GFR, Est Non African American: 74 mL/min (ref >=60)
GLUCOSE: 89 mg/dL (ref 65–99)
Potassium: 4.5 mmol/L (ref 3.5–5.3)
SODIUM: 137 mmol/L (ref 135–146)
TOTAL PROTEIN: 6.3 g/dL (ref 6.1–8.1)

## 2017-07-25 ENCOUNTER — Telehealth: Payer: Self-pay | Admitting: Rheumatology

## 2017-07-25 MED ORDER — ADALIMUMAB 40 MG/0.8ML ~~LOC~~ AJKT: 40.0000 mg | AUTO-INJECTOR | SUBCUTANEOUS | 2 refills | Status: DC

## 2017-07-25 NOTE — Telephone Encounter (Signed)
Patient left a message stating she needs rx for Humira called in. Also, would like to pick up a sample injection from [place] today if possible. Patient has a rov scheduled for this Friday 8/31. Please call to advise.

## 2017-07-25 NOTE — Telephone Encounter (Signed)
Last Visit: 01/17/17 Next Visit: 07/29/17 Labs: 07/20/17 WNL TB Gold: 08/31/16 Neg  Okay to refill per Dr. Bufford Buttner to give a sample per Dr. Estanislado Pandy. Patient advised.

## 2017-07-26 NOTE — Progress Notes (Deleted)
Office Visit Note  Patient: Samantha Graves             Date of Birth: [birth year]           MRN: [id number]             PCP: Samantha Graves Referring: No ref. provider found Visit Date: 07/29/2017 Occupation: @GUAROCC @    Subjective:  No chief complaint on file.   History of Present Illness: Samantha Graves is a 46 y.o. female ***   Activities of Daily Living:  Patient reports morning stiffness for *** {minute/hour:19697}.   Patient {ACTIONS;DENIES/REPORTS:21021675::"Denies"} nocturnal pain.  Difficulty dressing/grooming: {ACTIONS;DENIES/REPORTS:21021675::"Denies"} Difficulty climbing stairs: {ACTIONS;DENIES/REPORTS:21021675::"Denies"} Difficulty getting out of chair: {ACTIONS;DENIES/REPORTS:21021675::"Denies"} Difficulty using hands for taps, buttons, cutlery, and/or writing: {ACTIONS;DENIES/REPORTS:21021675::"Denies"}   No Rheumatology ROS completed.   PMFS History:  Patient Active Problem List   Diagnosis Date Noted  . Psoriasis 01/11/2017  . High risk medication use 01/11/2017  . PSORIATIC ARTHRITIS 10/14/2009  . ALLERGIC RHINITIS 09/06/2007    Past Medical History:  Diagnosis Date  . Arthritis 2003   psoriatic, Dr. Estanislado Pandy    Family History  Problem Relation Age of Onset  . Hyperlipidemia Mother   . Ovarian cysts Mother        benign  . Endometriosis Mother   . Other Father        auto imune  . Coronary artery disease Father        cardio ablation  . COPD Maternal Grandmother   . Bone cancer Maternal Grandfather 15  . Arthritis/Rheumatoid Sister 58  . Endometriosis Sister   . Retinitis pigmentosa Sister 42  . Ulcerative colitis Sister 21       surgical repair, J pouch  . Ovarian cancer Sister 8       negative genetic testing   Past Surgical History:  Procedure Laterality Date  . INTRAUTERINE DEVICE INSERTION  05/30/15, 05/06/10   MIrena IUD  . TONSILLECTOMY AND ADENOIDECTOMY  1990  . WISDOM TOOTH EXTRACTION Bilateral 1991   Social  History   Social History Narrative  . No narrative on file     Objective: Vital Signs: There were no vitals taken for this visit.   Physical Exam   Musculoskeletal Exam: ***  CDAI Exam: No CDAI exam completed.    Investigation: Findings:  TB Gold: 08/31/16 Neg  CBC Latest Ref Rng & Units 07/20/2017 01/04/2017 08/03/2016  WBC 3.8 - 10.8 K/uL 5.2 7.1 6.7  Hemoglobin 11.7 - 15.5 g/dL 13.3 13.8 13.7  Hematocrit 35.0 - 45.0 % 39.7 41.5 41.9  Platelets 140 - 400 K/uL 180 168 201   CMP Latest Ref Rng & Units 07/20/2017 01/04/2017 08/03/2016  Glucose 65 - 99 mg/dL 89 107(H) 87  BUN 7 - 25 mg/dL 19 18 15   Creatinine 0.50 - 1.10 mg/dL 0.94 0.86 0.86  Sodium 135 - 146 mmol/L 137 140 140  Potassium 3.5 - 5.3 mmol/L 4.5 4.2 4.7  Chloride 98 - 110 mmol/L 102 104 101  CO2 20 - 32 mmol/L 25 29 26   Calcium 8.6 - 10.2 mg/dL 9.7 9.5 9.8  Total Protein 6.1 - 8.1 g/dL 6.3 6.4 6.7  Total Bilirubin 0.2 - 1.2 mg/dL 0.7 0.5 0.7  Alkaline Phos 33 - 115 U/L 35 34 41  AST 10 - 35 U/L 32 57(H) 61(H)  ALT 6 - 29 U/L 28 64(H) 62(H)    Imaging: No results found.  Speciality Comments: No specialty comments available.  Procedures:  No procedures performed Allergies: Tamiflu [oseltamivir phosphate]   Assessment / Plan:     Visit Diagnoses: Psoriatic Arthropathy  Psoriasis  High risk medication use - Humira 40 mg sq q o week    Orders: No orders of the defined types were placed in this encounter.  No orders of the defined types were placed in this encounter.   Face-to-face time spent with patient was *** minutes. 50% of time was spent in counseling and coordination of care.  Follow-Up Instructions: No Follow-up on file.   Bo Merino, MD  Note - This record has been created using Editor, commissioning.  Chart creation errors have been sought, but may not always  have been located. Such creation errors do not reflect on  the standard of medical care.

## 2017-07-27 ENCOUNTER — Telehealth: Payer: Self-pay | Admitting: Rheumatology

## 2017-07-27 NOTE — Telephone Encounter (Signed)
[facility] needs ICD-10 code for Humira.

## 2017-07-27 NOTE — Telephone Encounter (Signed)
Spoke with Nira Conn and provided her with ICD Code.

## 2017-07-28 ENCOUNTER — Telehealth: Payer: Self-pay | Admitting: Rheumatology

## 2017-07-28 NOTE — Telephone Encounter (Signed)
Patient had to rs appt for tomorrow 8/31, and next available is 12/28/17. Patient wants to know if this will be a problem due to the medication she takes. Please call patient to advise.

## 2017-07-29 ENCOUNTER — Ambulatory Visit: Payer: BLUE CROSS/BLUE SHIELD | Admitting: Rheumatology

## 2017-07-29 NOTE — Telephone Encounter (Signed)
FYI =: Patient's last appointment was 01/17/17 Cancelled her appointment for 07/29/17 and has been rescheduled for 12/29/17.

## 2017-07-29 NOTE — Telephone Encounter (Signed)
Please overbook.

## 2017-08-05 ENCOUNTER — Ambulatory Visit: Payer: BLUE CROSS/BLUE SHIELD | Admitting: Nurse Practitioner

## 2017-08-15 ENCOUNTER — Telehealth: Payer: Self-pay | Admitting: Rheumatology

## 2017-08-15 MED ORDER — ADALIMUMAB 40 MG/0.8ML ~~LOC~~ AJKT: 40.0000 mg | AUTO-INJECTOR | SUBCUTANEOUS | 2 refills | Status: DC

## 2017-08-15 NOTE — Progress Notes (Deleted)
Office Visit Note  Patient: Samantha Graves             Date of Birth: 22-Feb-1971           MRN: [id number]             PCP: Patient, No Pcp Per Referring: No ref. provider found Visit Date: 08/25/2017 Occupation: @GUAROCC @    Subjective:  No chief complaint on file.   History of Present Illness: Samantha Graves is a 46 y.o. female ***   Activities of Daily Living:  Patient reports morning stiffness for *** {minute/hour:19697}.   Patient {ACTIONS;DENIES/REPORTS:21021675::"Denies"} nocturnal pain.  Difficulty dressing/grooming: {ACTIONS;DENIES/REPORTS:21021675::"Denies"} Difficulty climbing stairs: {ACTIONS;DENIES/REPORTS:21021675::"Denies"} Difficulty getting out of chair: {ACTIONS;DENIES/REPORTS:21021675::"Denies"} Difficulty using hands for taps, buttons, cutlery, and/or writing: {ACTIONS;DENIES/REPORTS:21021675::"Denies"}   No Rheumatology ROS completed.   PMFS History:  Patient Active Problem List   Diagnosis Date Noted  . Psoriasis 01/11/2017  . High risk medication use 01/11/2017  . PSORIATIC ARTHRITIS 10/14/2009  . ALLERGIC RHINITIS 09/06/2007    Past Medical History:  Diagnosis Date  . Arthritis 2003   psoriatic, Dr. Estanislado Pandy    Family History  Problem Relation Age of Onset  . Hyperlipidemia Mother   . Ovarian cysts Mother        benign  . Endometriosis Mother   . Other Father        auto imune  . Coronary artery disease Father        cardio ablation  . COPD Maternal Grandmother   . Bone cancer Maternal Grandfather 80  . Arthritis/Rheumatoid Sister 33  . Endometriosis Sister   . Retinitis pigmentosa Sister 47  . Ulcerative colitis Sister 21       surgical repair, J pouch  . Ovarian cancer Sister 61       negative genetic testing   Past Surgical History:  Procedure Laterality Date  . INTRAUTERINE DEVICE INSERTION  05/30/15, 05/06/10   MIrena IUD  . TONSILLECTOMY AND ADENOIDECTOMY  1990  . WISDOM TOOTH EXTRACTION Bilateral 1991   Social  History   Social History Narrative  . No narrative on file     Objective: Vital Signs: There were no vitals taken for this visit.   Physical Exam   Musculoskeletal Exam: ***  CDAI Exam: No CDAI exam completed.    Investigation: No additional findings.TB Gold: Negative in 08/2016 CBC Latest Ref Rng & Units 07/20/2017 01/04/2017 08/03/2016  WBC 3.8 - 10.8 K/uL 5.2 7.1 6.7  Hemoglobin 11.7 - 15.5 g/dL 13.3 13.8 13.7  Hematocrit 35.0 - 45.0 % 39.7 41.5 41.9  Platelets 140 - 400 K/uL 180 168 201   CMP Latest Ref Rng & Units 07/20/2017 01/04/2017 08/03/2016  Glucose 65 - 99 mg/dL 89 107(H) 87  BUN 7 - 25 mg/dL 19 18 15   Creatinine 0.50 - 1.10 mg/dL 0.94 0.86 0.86  Sodium 135 - 146 mmol/L 137 140 140  Potassium 3.5 - 5.3 mmol/L 4.5 4.2 4.7  Chloride 98 - 110 mmol/L 102 104 101  CO2 20 - 32 mmol/L 25 29 26   Calcium 8.6 - 10.2 mg/dL 9.7 9.5 9.8  Total Protein 6.1 - 8.1 g/dL 6.3 6.4 6.7  Total Bilirubin 0.2 - 1.2 mg/dL 0.7 0.5 0.7  Alkaline Phos 33 - 115 U/L 35 34 41  AST 10 - 35 U/L 32 57(H) 61(H)  ALT 6 - 29 U/L 28 64(H) 62(H)    Imaging: No results found.  Speciality Comments: No specialty comments available.  Procedures:  No procedures performed Allergies: Tamiflu [oseltamivir phosphate]   Assessment / Plan:     Visit Diagnoses: No diagnosis found.    Orders: No orders of the defined types were placed in this encounter.  No orders of the defined types were placed in this encounter.   Face-to-face time spent with patient was *** minutes. 50% of time was spent in counseling and coordination of care.  Follow-Up Instructions: No Follow-up on file.   Earnestine Mealing, NT  Note - This record has been created using Editor, commissioning.  Chart creation errors have been sought, but may not always  have been located. Such creation errors do not reflect on  the standard of medical care.

## 2017-08-15 NOTE — Telephone Encounter (Signed)
Patient calling in reference to Humira. Per patient there has been a mixup with pharmacy. Patient is to get it thru [facility]. Please call patient to advise.

## 2017-08-15 NOTE — Telephone Encounter (Signed)
Prescription resent to the correct pharmacy. Patient has been scheduled a sooner appointment for 08/25/17.

## 2017-08-18 ENCOUNTER — Telehealth: Payer: Self-pay | Admitting: Rheumatology

## 2017-08-18 NOTE — Telephone Encounter (Signed)
ok 

## 2017-08-18 NOTE — Telephone Encounter (Signed)
Patient has been advised she may pick up a sample. Will come 08/19/17

## 2017-08-18 NOTE — Telephone Encounter (Signed)
Last Visit: 01/17/17 Next Visit: 08/23/17 Labs: 07/20/17 WNL TB Gold: 08/31/16 Neg  Patient has not been able to receive her Humira do to insurance not wanting to cover it until 08/30/17.  Okay to provide with sample of Humira?

## 2017-08-18 NOTE — Telephone Encounter (Signed)
Per patient insurance will not pay for Humira until October 2nd. Patient is a week late at this point, and has no medication on hand. Patient schedule a rov for 9/25. Please call to advise.

## 2017-08-22 NOTE — Progress Notes (Deleted)
Office Visit Note  Patient: Samantha Graves             Date of Birth: 11/27/1971           MRN: [id number]             PCP: Patient, No Pcp Per Referring: No ref. provider found Visit Date: 08/23/2017 Occupation: @GUAROCC @    Subjective:  No chief complaint on file.   History of Present Illness: MERIEM LEMIEUX is a 46 y.o. female *** with psoriatic arthritis and psoriasis returns today after her last visit in February 2018.  Activities of Daily Living:  Patient reports morning stiffness for *** {minute/hour:19697}.   Patient {ACTIONS;DENIES/REPORTS:21021675::"Denies"} nocturnal pain.  Difficulty dressing/grooming: {ACTIONS;DENIES/REPORTS:21021675::"Denies"} Difficulty climbing stairs: {ACTIONS;DENIES/REPORTS:21021675::"Denies"} Difficulty getting out of chair: {ACTIONS;DENIES/REPORTS:21021675::"Denies"} Difficulty using hands for taps, buttons, cutlery, and/or writing: {ACTIONS;DENIES/REPORTS:21021675::"Denies"}   No Rheumatology ROS completed.   PMFS History:  Patient Active Problem List   Diagnosis Date Noted  . Psoriasis 01/11/2017  . High risk medication use 01/11/2017  . PSORIATIC ARTHRITIS 10/14/2009  . ALLERGIC RHINITIS 09/06/2007    Past Medical History:  Diagnosis Date  . Arthritis 2003   psoriatic, Dr. Estanislado Pandy    Family History  Problem Relation Age of Onset  . Hyperlipidemia Mother   . Ovarian cysts Mother        benign  . Endometriosis Mother   . Other Father        auto imune  . Coronary artery disease Father        cardio ablation  . COPD Maternal Grandmother   . Bone cancer Maternal Grandfather 17  . Arthritis/Rheumatoid Sister 57  . Endometriosis Sister   . Retinitis pigmentosa Sister 35  . Ulcerative colitis Sister 21       surgical repair, J pouch  . Ovarian cancer Sister 26       negative genetic testing   Past Surgical History:  Procedure Laterality Date  . INTRAUTERINE DEVICE INSERTION  05/30/15, 05/06/10   MIrena IUD  .  TONSILLECTOMY AND ADENOIDECTOMY  1990  . WISDOM TOOTH EXTRACTION Bilateral 1991   Social History   Social History Narrative  . No narrative on file     Objective: Vital Signs: There were no vitals taken for this visit.   Physical Exam   Musculoskeletal Exam: ***  CDAI Exam: No CDAI exam completed.    Investigation: No additional findings. TB gold 08/31/2016 CBC Latest Ref Rng & Units 07/20/2017 01/04/2017 08/03/2016  WBC 3.8 - 10.8 K/uL 5.2 7.1 6.7  Hemoglobin 11.7 - 15.5 g/dL 13.3 13.8 13.7  Hematocrit 35.0 - 45.0 % 39.7 41.5 41.9  Platelets 140 - 400 K/uL 180 168 201   CMP     Component Value Date/Time   NA 137 07/20/2017 1035   K 4.5 07/20/2017 1035   CL 102 07/20/2017 1035   CO2 25 07/20/2017 1035   GLUCOSE 89 07/20/2017 1035   BUN 19 07/20/2017 1035   CREATININE 0.94 07/20/2017 1035   CALCIUM 9.7 07/20/2017 1035   PROT 6.3 07/20/2017 1035   ALBUMIN 4.3 07/20/2017 1035   AST 32 07/20/2017 1035   ALT 28 07/20/2017 1035   ALKPHOS 35 07/20/2017 1035   BILITOT 0.7 07/20/2017 1035   GFRNONAA 74 07/20/2017 1035   GFRAA 85 07/20/2017 1035    Imaging: No results found.  Speciality Comments: No specialty comments available.    Procedures:  No procedures performed Allergies: Tamiflu [oseltamivir phosphate]   Assessment /  Plan:     Visit Diagnoses: No diagnosis found.    Orders: No orders of the defined types were placed in this encounter.  No orders of the defined types were placed in this encounter.   Face-to-face time spent with patient was *** minutes. 50% of time was spent in counseling and coordination of care.  Follow-Up Instructions: No Follow-up on file.   Bo Merino, MD  Note - This record has been created using Editor, commissioning.  Chart creation errors have been sought, but may not always  have been located. Such creation errors do not reflect on  the standard of medical care.

## 2017-08-23 ENCOUNTER — Ambulatory Visit: Payer: BLUE CROSS/BLUE SHIELD | Admitting: Rheumatology

## 2017-08-24 ENCOUNTER — Encounter: Payer: Self-pay | Admitting: Rheumatology

## 2017-08-24 ENCOUNTER — Ambulatory Visit (INDEPENDENT_AMBULATORY_CARE_PROVIDER_SITE_OTHER): Payer: BLUE CROSS/BLUE SHIELD | Admitting: Rheumatology

## 2017-08-24 VITALS — BP 120/70 | HR 80 | Resp 14 | Ht 67.5 in | Wt 140.0 lb

## 2017-08-24 DIAGNOSIS — M7061 Trochanteric bursitis, right hip: Secondary | ICD-10-CM | POA: Diagnosis not present

## 2017-08-24 DIAGNOSIS — L409 Psoriasis, unspecified: Secondary | ICD-10-CM | POA: Diagnosis not present

## 2017-08-24 DIAGNOSIS — Z79899 Other long term (current) drug therapy: Secondary | ICD-10-CM

## 2017-08-24 DIAGNOSIS — M7062 Trochanteric bursitis, left hip: Secondary | ICD-10-CM | POA: Diagnosis not present

## 2017-08-24 DIAGNOSIS — L4050 Arthropathic psoriasis, unspecified: Secondary | ICD-10-CM

## 2017-08-24 NOTE — Patient Instructions (Signed)
Standing Labs We placed an order today for your standing lab work.    Please come back and get your standing labs in November and every 3 months. TB Gold is due in November.  We have open lab Monday through Friday from 8:30-11:30 AM and 1:30-4 PM at the office of Dr. Bo Merino.   The office is located at [address], [place], [place], [place] [address] No appointment is necessary.   Labs are drawn by [facility].  You may receive a bill from [facility] for your lab work. If you have any questions regarding directions or hours of operation,  please call [phone].

## 2017-08-24 NOTE — Progress Notes (Signed)
Office Visit Note  Patient: Samantha Graves             Date of Birth: 07/30/1971           MRN: [id number]             PCP: Patient, No Pcp Per Referring: No ref. provider found Visit Date: 08/24/2017 Occupation: @GUAROCC @    Subjective:  Medication Management   History of Present Illness: Samantha Graves is a 46 y.o. female with history of psoriatic arthritis and psoriasis. She returns today after her last visit in February 2018. She states she's not been very consistent with her Humira injections due to insurance and lab issues. She has missed a few weeks in the past and also a week last month. She describes pain over bilateral trochanteric bursa area. She also has discomfort in her bilateral ankles and bilateral feet. She has some swelling in her left first MTP joint which she relates to her bunion.  Activities of Daily Living:  Patient reports morning stiffness for 30 minutes.   Patient Reports nocturnal pain.  Difficulty dressing/grooming: Reports Difficulty climbing stairs: Denies Difficulty getting out of chair: Denies Difficulty using hands for taps, buttons, cutlery, and/or writing: Denies   Review of Systems  Constitutional: Positive for fatigue. Negative for night sweats, weight gain, weight loss and weakness.  HENT: Negative for ear pain, mouth sores, trouble swallowing, trouble swallowing, mouth dryness and nose dryness.   Eyes: Negative for pain, redness, visual disturbance and dryness.  Respiratory: Positive for shortness of breath. Negative for cough and difficulty breathing.        Worse with running can't get a deep breath  Cardiovascular: Negative for chest pain, palpitations, hypertension, irregular heartbeat and swelling in legs/feet.  Gastrointestinal: Negative.  Negative for blood in stool, constipation and diarrhea.  Endocrine: Negative.  Negative for increased urination.  Genitourinary: Negative.  Negative for vaginal dryness.  Musculoskeletal:  Positive for arthralgias, joint pain, muscle weakness and morning stiffness. Negative for joint swelling, myalgias, muscle tenderness and myalgias.  Skin: Negative.  Negative for color change, rash, hair loss, skin tightness, ulcers and sensitivity to sunlight.  Allergic/Immunologic: Negative.  Negative for susceptible to infections.  Neurological: Negative.  Negative for dizziness, memory loss and night sweats.  Hematological: Negative.  Negative for swollen glands.  Psychiatric/Behavioral: Negative.  Negative for depressed mood and sleep disturbance. The patient is not nervous/anxious.     PMFS History:  Patient Active Problem List   Diagnosis Date Noted  . Psoriasis 01/11/2017  . High risk medication use 01/11/2017  . PSORIATIC ARTHRITIS 10/14/2009  . ALLERGIC RHINITIS 09/06/2007    Past Medical History:  Diagnosis Date  . Arthritis 2003   psoriatic, Dr. Estanislado Pandy    Family History  Problem Relation Age of Onset  . Hyperlipidemia Mother   . Ovarian cysts Mother        benign  . Endometriosis Mother   . Other Father        auto imune  . Coronary artery disease Father        cardio ablation  . COPD Maternal Grandmother   . Bone cancer Maternal Grandfather 88  . Arthritis/Rheumatoid Sister 34  . Endometriosis Sister   . Retinitis pigmentosa Sister 26  . Ulcerative colitis Sister 21       surgical repair, J pouch  . Ovarian cancer Sister 7       negative genetic testing   Past Surgical History:  Procedure Laterality  Date  . INTRAUTERINE DEVICE INSERTION  05/30/15, 05/06/10   MIrena IUD  . TONSILLECTOMY AND ADENOIDECTOMY  1990  . WISDOM TOOTH EXTRACTION Bilateral 1991   Social History   Social History Narrative  . No narrative on file     Objective: Vital Signs: BP 120/70   Pulse 80   Resp 14   Ht 5' 7.5" (1.715 m)   Wt 140 lb (63.5 kg)   BMI 21.60 kg/m    Physical Exam  Constitutional: She is oriented to person, place, and time. She appears well-developed  and well-nourished.  HENT:  Head: Normocephalic and atraumatic.  Eyes: Conjunctivae and EOM are normal.  Neck: Normal range of motion.  Cardiovascular: Normal rate, regular rhythm, normal heart sounds and intact distal pulses.   Pulmonary/Chest: Effort normal and breath sounds normal.  Abdominal: Soft. Bowel sounds are normal.  Lymphadenopathy:    She has no cervical adenopathy.  Neurological: She is alert and oriented to person, place, and time.  Skin: Skin is warm and dry. Capillary refill takes less than 2 seconds.  She has  dermal atrophy over the left trochanteric area.  Psychiatric: She has a normal mood and affect. Her behavior is normal.  Nursing note and vitals reviewed.    Musculoskeletal Exam: C-spine and thoracic lumbar spine good range of motion. Shoulder joints although joints wrist joint MCPs PIPs DIPs with good range of motion with no synovitis. Hip joint, knee joints, ankles MTPs PIPs with good range of motion with no synovitis. She has thickening of the left first MTP joint consistent with hallux rigidus. She tenderness over bilateral trochanteric bursa.  CDAI Exam: CDAI Homunculus Exam:   Joint Counts:  CDAI Tender Joint count: 0 CDAI Swollen Joint count: 0  Global Assessments:  Patient Global Assessment: 3 Provider Global Assessment: 3  CDAI Calculated Score: 6    Investigation: Findings:  TB gold negative 08/31/2016   CBC Latest Ref Rng & Units 07/20/2017 01/04/2017 08/03/2016  WBC 3.8 - 10.8 K/uL 5.2 7.1 6.7  Hemoglobin 11.7 - 15.5 g/dL 13.3 13.8 13.7  Hematocrit 35.0 - 45.0 % 39.7 41.5 41.9  Platelets 140 - 400 K/uL 180 168 201    CMP Latest Ref Rng & Units 07/20/2017 01/04/2017 08/03/2016  Glucose 65 - 99 mg/dL 89 107(H) 87  BUN 7 - 25 mg/dL 19 18 15   Creatinine 0.50 - 1.10 mg/dL 0.94 0.86 0.86  Sodium 135 - 146 mmol/L 137 140 140  Potassium 3.5 - 5.3 mmol/L 4.5 4.2 4.7  Chloride 98 - 110 mmol/L 102 104 101  CO2 20 - 32 mmol/L 25 29 26   Calcium 8.6 -  10.2 mg/dL 9.7 9.5 9.8  Total Protein 6.1 - 8.1 g/dL 6.3 6.4 6.7  Total Bilirubin 0.2 - 1.2 mg/dL 0.7 0.5 0.7  Alkaline Phos 33 - 115 U/L 35 34 41  AST 10 - 35 U/L 32 57(H) 61(H)  ALT 6 - 29 U/L 28 64(H) 62(H)   Imaging: No results found.  Speciality Comments: No specialty comments available.    Procedures:  No procedures performed Allergies: Tamiflu [oseltamivir phosphate]   Assessment / Plan:     Visit Diagnoses: PSORIATIC ARTHRITIS: She has no active synovitis today. She states she's been having intermittent flares due to missing her Humira dose.  Psoriasis: She has no active lesions.  High risk medication use - Humira 40 mg every other week. - Plan: Quantiferon tb gold assay (blood) with her next labs. Her labs have been stable. We  will continue to monitor her labs every 3 months.  Trochanteric bursitis of both hips : She has bilateral trochanteric bursitis. I would discourage use of cortisone. She has developed some dermal atrophy over her left trochanteric area. His stretching exercises were demonstrated and discussed. I offered physical therapy which she declined.   Orders: Orders Placed This Encounter  Procedures  . Quantiferon tb gold assay (blood)   No orders of the defined types were placed in this encounter.   .  Follow-Up Instructions: Return in about 5 months (around 01/24/2018) for Psoriatic arthritis.   Bo Merino, MD  Note - This record has been created using Editor, commissioning.  Chart creation errors have been sought, but may not always  have been located. Such creation errors do not reflect on  the standard of medical care.

## 2017-08-25 ENCOUNTER — Ambulatory Visit: Payer: BLUE CROSS/BLUE SHIELD | Admitting: Rheumatology

## 2017-11-04 ENCOUNTER — Other Ambulatory Visit: Payer: Self-pay | Admitting: *Deleted

## 2017-11-04 ENCOUNTER — Other Ambulatory Visit: Payer: Self-pay

## 2017-11-04 DIAGNOSIS — Z79899 Other long term (current) drug therapy: Secondary | ICD-10-CM

## 2017-11-06 LAB — COMPLETE METABOLIC PANEL WITH GFR
AG RATIO: 1.7 (calc) (ref 1.0–2.5)
ALBUMIN MSPROF: 4 g/dL (ref 3.6–5.1)
ALKALINE PHOSPHATASE (APISO): 36 U/L (ref 33–115)
ALT: 19 U/L (ref 6–29)
AST: 26 U/L (ref 10–35)
BILIRUBIN TOTAL: 0.6 mg/dL (ref 0.2–1.2)
BUN: 18 mg/dL (ref 7–25)
CHLORIDE: 102 mmol/L (ref 98–110)
CO2: 30 mmol/L (ref 20–32)
Calcium: 9.1 mg/dL (ref 8.6–10.2)
Creat: 0.81 mg/dL (ref 0.50–1.10)
GFR, EST AFRICAN AMERICAN: 102 mL/min/{1.73_m2} (ref >=60)
GFR, Est Non African American: 88 mL/min/{1.73_m2} (ref >=60)
GLUCOSE: 87 mg/dL (ref 65–99)
Globulin: 2.4 g/dL (calc) (ref 1.9–3.7)
POTASSIUM: 4.1 mmol/L (ref 3.5–5.3)
SODIUM: 137 mmol/L (ref 135–146)
TOTAL PROTEIN: 6.4 g/dL (ref 6.1–8.1)

## 2017-11-06 LAB — CBC WITH DIFFERENTIAL/PLATELET
BASOS ABS: 52 {cells}/uL (ref 0–200)
Basophils Relative: 1 %
EOS PCT: 0.8 %
Eosinophils Absolute: 42 cells/uL (ref 15–500)
HEMATOCRIT: 39.5 % (ref 35.0–45.0)
Hemoglobin: 13.7 g/dL (ref 11.7–15.5)
Lymphs Abs: 1362 cells/uL (ref 850–3900)
MCH: 32.1 pg (ref 27.0–33.0)
MCHC: 34.7 g/dL (ref 32.0–36.0)
MCV: 92.5 fL (ref 80.0–100.0)
MONOS PCT: 7.1 %
MPV: 10.8 fL (ref 7.5–12.5)
NEUTROS PCT: 64.9 %
Neutro Abs: 3375 cells/uL (ref 1500–7800)
PLATELETS: 184 10*3/uL (ref 140–400)
RBC: 4.27 10*6/uL (ref 3.80–5.10)
RDW: 11.4 % (ref 11.0–15.0)
TOTAL LYMPHOCYTE: 26.2 %
WBC mixed population: 369 cells/uL (ref 200–950)
WBC: 5.2 10*3/uL (ref 3.8–10.8)

## 2017-11-06 LAB — QUANTIFERON TB GOLD ASSAY (BLOOD)
Mitogen-Nil: >10 IU/mL
QUANTIFERON NIL VALUE: 0.03 [IU]/mL
QUANTIFERON(R)-TB GOLD: NEGATIVE

## 2017-11-08 NOTE — Progress Notes (Signed)
WNL

## 2017-11-10 ENCOUNTER — Other Ambulatory Visit: Payer: Self-pay | Admitting: *Deleted

## 2017-11-10 MED ORDER — ADALIMUMAB 40 MG/0.4ML ~~LOC~~ AJKT: 40.0000 mg | AUTO-INJECTOR | SUBCUTANEOUS | 0 refills | Status: DC

## 2017-11-10 NOTE — Telephone Encounter (Signed)
Patient requesting a refill request for Humira  Last Visit: 08/24/17 Next Visit due January 2019. Message sent to the fron to schedule patient.  Labs: 11/04/17 WNL TB Gold: 11/04/17 Neg   Okay to refill Humira per Dr. Estanislado Pandy

## 2017-11-11 ENCOUNTER — Telehealth: Payer: Self-pay

## 2017-11-11 NOTE — Telephone Encounter (Signed)
Received a fax from [facility] stating that they received the Rx for Humira for pt but needed clinical notes and prior therapy in order to complete request for refill. Called [facility] to get clarification. Spoke with Samantha Graves who states that they need this information to complete the prior authorization. I told her that we would handle pts prior authorization. She also states that the patient had a note in their profile form October 2018 stating that they did not want to continue filling Rx's with them.   A prior authorization for HUMIRA was submitted to patients insurance ([place]). Will update once we receive a response.   Called patient to update. She states that her Rx should be going to [facility] now due to insurance. She is also requesting a sample for HUMIRA. Her last injection was two weeks from today. Thanks!  Bernita Beckstrom, [place], CPhT 12:38 PM

## 2017-11-11 NOTE — Telephone Encounter (Signed)
Received a fax from [facility] regarding a prior authorization for [place]. It states that patient has an authorization on file. Medication must be filled through specialty pharmacy.   Reference number:none  Phone number: [phone]  [place] to clarify. Spoke with Destiny T who states that the authorization has been approved form 10/15/2015 through the life of the policy. She will fax a copy of the authorization to the clinic.   Auth number: [id number]  Will send document to scan center.  Cailen Mihalik, [place], CPhT 4:25 PM

## 2017-11-11 NOTE — Telephone Encounter (Signed)
Left message for patient to pick up samples today.   Samantha Graves, [place], CPhT 1:36 PM

## 2017-11-11 NOTE — Telephone Encounter (Signed)
Last Visit: 08/24/17 Next Visit: 12/28/17 Labs: 11/04/17 WNL TB Gold: 11/04/17 Neg  Okay to give sample of Humira.

## 2017-11-14 NOTE — Telephone Encounter (Signed)
Medication Samples have been provided to the patient.  Drug name: Humira      Strength: 40mg /0.44mL        Qty: 1  LOT: [id number]  Exp.Date: 02/2019 Dosing instructions: inject 1 pen every 14 days   The patient has been instructed regarding the correct time, dose, and frequency of taking this medication, including desired effects and most common side effects.   Darcy Barbara C Kendall Justo 12:02 PM 11/14/2017

## 2017-11-14 NOTE — Telephone Encounter (Signed)
Received a fax from [facility] regarding a prior authorization approval for HUMIRA from 10/15/2015 to 11/28/2038.  Reference number:[id number] Phone number:[phone]  Will send document to scan center.  Called patient to update. Left message.   Kvion Shapley, [place], CPhT  8:40 AM

## 2017-11-15 ENCOUNTER — Telehealth: Payer: Self-pay

## 2017-11-15 MED ORDER — ADALIMUMAB 40 MG/0.4ML ~~LOC~~ AJKT: 40.0000 mg | AUTO-INJECTOR | SUBCUTANEOUS | 0 refills | Status: DC

## 2017-11-15 NOTE — Telephone Encounter (Signed)
Received a fax from [facility] requesting clinical notes and prior therapy. Spoke with [place] from [place] who states that the pt is requiring a prior authorization for Enbrel 40mg /0.2ml CF. Patient has an authorization on file. Iantha Fallen can no longer fill Enbrel for pt due to insurance. Her plan now requires [facility]. [place] will cancel Rx for pt.   Called [employer] to verify coverage for Humira 40mg /0.15ml CF pens. Spoke with antoinette who states that Humira 40mg /0.25ml is what is currently approved and the 40mg /0.30ml would be approved under the same authorization as well.  Authorization number: [id number]  Please send Rx to [facility] due to insurance. Thanks!  Kenric Ginger, [place], CPhT 12:16 PM

## 2017-11-15 NOTE — Telephone Encounter (Signed)
Prescription approved on 11/10/17. Resent to [facility]

## 2017-12-28 ENCOUNTER — Encounter: Payer: Self-pay | Admitting: Rheumatology

## 2017-12-28 ENCOUNTER — Ambulatory Visit (INDEPENDENT_AMBULATORY_CARE_PROVIDER_SITE_OTHER): Payer: BLUE CROSS/BLUE SHIELD | Admitting: Rheumatology

## 2017-12-28 ENCOUNTER — Ambulatory Visit: Payer: BLUE CROSS/BLUE SHIELD | Admitting: Rheumatology

## 2017-12-28 VITALS — BP 112/74 | HR 74 | Resp 14 | Ht 67.5 in | Wt 147.0 lb

## 2017-12-28 DIAGNOSIS — L409 Psoriasis, unspecified: Secondary | ICD-10-CM

## 2017-12-28 DIAGNOSIS — M7062 Trochanteric bursitis, left hip: Secondary | ICD-10-CM

## 2017-12-28 DIAGNOSIS — Z79899 Other long term (current) drug therapy: Secondary | ICD-10-CM

## 2017-12-28 DIAGNOSIS — M7061 Trochanteric bursitis, right hip: Secondary | ICD-10-CM | POA: Diagnosis not present

## 2017-12-28 DIAGNOSIS — L4050 Arthropathic psoriasis, unspecified: Secondary | ICD-10-CM

## 2017-12-28 NOTE — Progress Notes (Signed)
Office Visit Note  Patient: Samantha Graves             Date of Birth: 1971-03-24           MRN: [id number]             PCP: Patient, No Pcp Per Referring: No ref. provider found Visit Date: 12/28/2017 Occupation: @GUAROCC @    Subjective:  Trochanteric bursitis in both hips   History of Present Illness: Samantha Graves is a 47 y.o. female with history of psoriatic arthritis and trochanteric bursitis bilaterally.  Patient states she is still taking Humira.  She states she had to skip a dose in December due to a URI, and she feels she has been more achy since especially with weather changes.  She denies any joint swelling.  She states she experiences joint stiffness and popping in her right ankle daily.  She previously sprained this ankle.  She states she has some psoriasis in her ears.  She denies any SI joint pain, achilles tendonitis, or plantar fasciitis.  She uses a natural remedy, which helps.  She states her trochanteric bursitis continues to cause discomfort bilaterally, worse on the left.  She lays on a tennis ball, massages, and has gone to physical therapy which has helped.       Activities of Daily Living:  Patient reports morning stiffness for 2 hours.   Patient Denies nocturnal pain.  Difficulty dressing/grooming: Denies Difficulty climbing stairs: Denies Difficulty getting out of chair: Denies Difficulty using hands for taps, buttons, cutlery, and/or writing: Denies   Review of Systems  Constitutional: Positive for fatigue. Negative for weakness.  HENT: Negative for mouth sores, mouth dryness and nose dryness.   Eyes: Negative for redness, visual disturbance and dryness.  Respiratory: Negative for cough, hemoptysis, shortness of breath and difficulty breathing.   Cardiovascular: Negative for chest pain, palpitations, hypertension, irregular heartbeat and swelling in legs/feet.  Gastrointestinal: Negative for blood in stool, constipation and diarrhea.  Endocrine:  Negative for increased urination.  Genitourinary: Negative for painful urination.  Musculoskeletal: Positive for arthralgias, joint pain and morning stiffness. Negative for joint swelling, myalgias, muscle weakness, muscle tenderness and myalgias.  Skin: Positive for rash. Negative for color change, pallor, hair loss, nodules/bumps, redness, skin tightness, ulcers and sensitivity to sunlight.  Neurological: Negative for dizziness, numbness and headaches.  Hematological: Negative for swollen glands.  Psychiatric/Behavioral: Negative for depressed mood and sleep disturbance. The patient is not nervous/anxious.     PMFS History:  Patient Active Problem List   Diagnosis Date Noted  . Psoriasis 01/11/2017  . High risk medication use 01/11/2017  . PSORIATIC ARTHRITIS 10/14/2009  . ALLERGIC RHINITIS 09/06/2007    Past Medical History:  Diagnosis Date  . Arthritis 2003   psoriatic, Dr. Estanislado Pandy    Family History  Problem Relation Age of Onset  . Hyperlipidemia Mother   . Ovarian cysts Mother        benign  . Endometriosis Mother   . Other Father        auto imune  . Coronary artery disease Father        cardio ablation  . COPD Maternal Grandmother   . Bone cancer Maternal Grandfather 80  . Arthritis/Rheumatoid Sister 63  . Endometriosis Sister   . Retinitis pigmentosa Sister 46  . Ulcerative colitis Sister 21       surgical repair, J pouch  . Ovarian cancer Sister 43       negative genetic testing  Past Surgical History:  Procedure Laterality Date  . INTRAUTERINE DEVICE INSERTION  05/30/15, 05/06/10   MIrena IUD  . TONSILLECTOMY AND ADENOIDECTOMY  1990  . WISDOM TOOTH EXTRACTION Bilateral 1991   Social History   Social History Narrative  . Not on file     Objective: Vital Signs: BP 112/74 (BP Location: Left Arm, Patient Position: Sitting, Cuff Size: Normal)   Pulse 74   Resp 14   Ht 5' 7.5" (1.715 m)   Wt 147 lb (66.7 kg)   BMI 22.68 kg/m    Physical Exam    Constitutional: She is oriented to person, place, and time. She appears well-developed and well-nourished.  HENT:  Head: Normocephalic and atraumatic.  Eyes: Conjunctivae and EOM are normal.  Neck: Normal range of motion.  Cardiovascular: Normal rate, regular rhythm, normal heart sounds and intact distal pulses.  Pulmonary/Chest: Effort normal and breath sounds normal.  Abdominal: Soft. Bowel sounds are normal.  Lymphadenopathy:    She has no cervical adenopathy.  Neurological: She is alert and oriented to person, place, and time.  Skin: Skin is warm and dry. Capillary refill takes less than 2 seconds.  Psychiatric: She has a normal mood and affect. Her behavior is normal.  Nursing note and vitals reviewed.    Musculoskeletal Exam: C-spine, thoracic, and lumbar spine good ROM.  Shoulder joints, elbow joints, wrist joints, MCPs, PIPs, and DIPs good ROM with no synovitis. Hip joints, knee joints, ankle joints, MTPs, PIPs, and DIPs good ROM with no synovitis or dactylitis.  She has some stiffness and crepitus in her right ankle.  No warmth or effusion of her knees.  No knee crepitus.  No midline spinal tenderness or SI joint tenderness.  She has bilateral trochanteric bursa tenderness.  No achilles tendonitis or plantar fasciitis.    CDAI Exam: CDAI Homunculus Exam:   Joint Counts:  CDAI Tender Joint count: 0 CDAI Swollen Joint count: 0  Global Assessments:  Patient Global Assessment: 4   CDAI Calculated Score: 4    Investigation: No additional findings.Tb Gold: 11/04/2017 Negative  CBC Latest Ref Rng & Units 11/04/2017 07/20/2017 01/04/2017  WBC 3.8 - 10.8 Thousand/uL 5.2 5.2 7.1  Hemoglobin 11.7 - 15.5 g/dL 13.7 13.3 13.8  Hematocrit 35.0 - 45.0 % 39.5 39.7 41.5  Platelets 140 - 400 Thousand/uL 184 180 168   CMP Latest Ref Rng & Units 11/04/2017 07/20/2017 01/04/2017  Glucose 65 - 99 mg/dL 87 89 107(H)  BUN 7 - 25 mg/dL 18 19 18   Creatinine 0.50 - 1.10 mg/dL 0.81 0.94 0.86   Sodium 135 - 146 mmol/L 137 137 140  Potassium 3.5 - 5.3 mmol/L 4.1 4.5 4.2  Chloride 98 - 110 mmol/L 102 102 104  CO2 20 - 32 mmol/L 30 25 29   Calcium 8.6 - 10.2 mg/dL 9.1 9.7 9.5  Total Protein 6.1 - 8.1 g/dL 6.4 6.3 6.4  Total Bilirubin 0.2 - 1.2 mg/dL 0.6 0.7 0.5  Alkaline Phos 33 - 115 U/L - 35 34  AST 10 - 35 U/L 26 32 57(H)  ALT 6 - 29 U/L 19 28 64(H)    Imaging: No results found.  Speciality Comments: No specialty comments available.    Procedures:  No procedures performed Allergies: Oseltamivir and Tamiflu [oseltamivir phosphate]   Assessment / Plan:     Visit Diagnoses: PSORIATIC ARTHRITIS: She has no synovitis or dactylitis on exam.  She has occasional tenderness in her hands.  She will need x-rays of her hands and  feet at her next visit.  She will continue on Humira.  Her most recent CBC and CMP were performed on 11/04/17.   Psoriasis: She has psoriasis currently in her bilateral ears.  She is using a natural remedy, which helps keep her psoriasis under control.   High risk medication use - Humira 40 mg every other week.  Standing orders are in place for every 3 months.  She will return in March for lab work. - Plan: COMPLETE METABOLIC PANEL WITH GFR, CBC with Differential/Platelet  Trochanteric bursitis of both hips: She has bilateral trochanteric bursitis.  Her tenderness is worse on the left.  She will continue stretching at home and using a tennis ball to roll on.  She also performs massage therapy and has gone to physical therapy, which have been improving her symptoms.   Orders: Orders Placed This Encounter  Procedures  . COMPLETE METABOLIC PANEL WITH GFR  . CBC with Differential/Platelet   No orders of the defined types were placed in this encounter.    Follow-Up Instructions: Return in about 5 months (around 05/28/2018) for Psoriatic arthritis.   Bo Merino, MD  Note - This record has been created using Editor, commissioning.  Chart creation errors  have been sought, but may not always  have been located. Such creation errors do not reflect on  the standard of medical care.

## 2017-12-28 NOTE — Patient Instructions (Signed)
Standing Labs We placed an order today for your standing lab work.    Please come back and get your standing labs in March and every 3 months  We have open lab Monday through Friday from 8:30-11:30 AM and 1:30-4 PM at the office of Dr. Shaili Deveshwar.   The office is located at [address], [address], [place], [place] [address] No appointment is necessary.   Labs are drawn by [facility].  You may receive a bill from [facility] for your lab work. If you have any questions regarding directions or hours of operation,  please call [phone].    

## 2018-01-12 ENCOUNTER — Other Ambulatory Visit: Payer: Self-pay | Admitting: Rheumatology

## 2018-01-12 NOTE — Telephone Encounter (Signed)
Last Visit: 12/28/17 Next Visit due in June. Message sent to the front to schedule. Labs: 11/04/17 WNL TB Gold: 11/04/17 Neg   Okay to refill per Dr. Estanislado Pandy

## 2018-01-16 ENCOUNTER — Other Ambulatory Visit: Payer: Self-pay | Admitting: Rheumatology

## 2018-01-17 ENCOUNTER — Telehealth: Payer: Self-pay | Admitting: Rheumatology

## 2018-01-17 NOTE — Telephone Encounter (Signed)
-----   Message from Carole Binning, LPN sent at 1/00/7121  8:24 AM EST ----- Regarding: Please schedule patient for follow up visit Please schedule patient for follow up visit. Patient is due June 2019. Thanks!

## 2018-01-17 NOTE — Telephone Encounter (Signed)
I spoke with patient in regards to scheduling next rov for June 2019. Patient was on her way out the door, and will call back at a later date to schedule.

## 2018-01-18 NOTE — Progress Notes (Addendum)
Office Visit Note  Patient: Samantha Graves             Date of Birth: 11-02-1971           MRN: [id number]             PCP: Patient, No Pcp Per Referring: No ref. provider found Visit Date: 01/19/2018 Occupation: @GUAROCC @    Subjective:  Back pain    History of Present Illness: Samantha Graves is a 47 y.o. female with history of psoriatic arthritis and trochanteric bursitis.  Patient states that about 10 days ago she developed lower back pain.  She attributed it to sitting more due to study and postural changes. Her back pain continued to worsen.  She went to physical therapy twice last week, which did not seem to help.  She reports that about 4-5 days ago she started to have pain on the right side of her ribcage in her the back.  She describes it as a band of sharp pain.  She denies any tingling or burning on her skin.  She states the pain is a constant ache but is sharp if she does sudden movement.  She states she has never had pain like this in the past.  She states the pain is worse if laying flat or sitting.  She states the pain was the worst yesterday and it was a 8/10.  She has been taking Ibuprofen for pain and took a muscle relaxer the other day and it did not help. She reports she is having worsening fatigue but she also hasn't been sleeping well due to her level of pain.  She states her right ankle is more achy.  She also is having pain in her left hand.  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  She is due for her Humira tomorrow.  She denies missing any doses.  She states she has been having dizziness upon standing more frequently.   She reports her bursitis has improved.  She has some discomfort of her left trochanteric bursa due to laying on her left side at night.     Activities of Daily Living:  Patient reports morning stiffness for 30 minutes.   Patient Reports nocturnal pain.  Difficulty dressing/grooming: Denies Difficulty climbing stairs:  Denies Difficulty getting out of chair: Reports Difficulty using hands for taps, buttons, cutlery, and/or writing: Denies   Review of Systems  Constitutional: Positive for fatigue. Negative for weakness.  HENT: Negative for mouth sores, trouble swallowing, trouble swallowing, mouth dryness and nose dryness.   Eyes: Negative for pain, redness, visual disturbance and dryness.  Respiratory: Positive for difficulty breathing (With exertion). Negative for cough, hemoptysis and shortness of breath.   Cardiovascular: Negative for chest pain, palpitations, hypertension, irregular heartbeat and swelling in legs/feet.  Gastrointestinal: Negative for blood in stool, constipation and diarrhea.  Endocrine: Negative for increased urination.  Genitourinary: Negative for painful urination.  Musculoskeletal: Positive for arthralgias, joint pain, joint swelling, myalgias, morning stiffness and myalgias. Negative for muscle weakness and muscle tenderness.  Skin: Positive for color change and rash. Negative for pallor, hair loss, nodules/bumps, redness, skin tightness, ulcers and sensitivity to sunlight.  Allergic/Immunologic: Negative for susceptible to infections.  Neurological: Positive for dizziness. Negative for numbness and headaches.  Hematological: Negative for swollen glands.  Psychiatric/Behavioral: Negative for depressed mood and sleep disturbance. The patient is not nervous/anxious.     PMFS History:  Patient Active Problem List   Diagnosis Date Noted  .  Psoriasis 01/11/2017  . High risk medication use 01/11/2017  . PSORIATIC ARTHRITIS 10/14/2009  . ALLERGIC RHINITIS 09/06/2007    Past Medical History:  Diagnosis Date  . Arthritis 2003   psoriatic, Dr. Estanislado Pandy    Family History  Problem Relation Age of Onset  . Hyperlipidemia Mother   . Ovarian cysts Mother        benign  . Endometriosis Mother   . Other Father        auto imune  . Coronary artery disease Father        cardio  ablation  . COPD Maternal Grandmother   . Bone cancer Maternal Grandfather 38  . Arthritis/Rheumatoid Sister 38  . Endometriosis Sister   . Retinitis pigmentosa Sister 72  . Ulcerative colitis Sister 21       surgical repair, J pouch  . Ovarian cancer Sister 49       negative genetic testing   Past Surgical History:  Procedure Laterality Date  . INTRAUTERINE DEVICE INSERTION  05/30/15, 05/06/10   MIrena IUD  . TONSILLECTOMY AND ADENOIDECTOMY  1990  . WISDOM TOOTH EXTRACTION Bilateral 1991   Social History   Social History Narrative  . Not on file     Objective: Vital Signs: BP 118/75 (BP Location: Left Arm, Patient Position: Sitting, Cuff Size: Normal)   Pulse 75   Resp 15   Ht 5' 7.5" (1.715 m)   Wt 149 lb (67.6 kg)   BMI 22.99 kg/m    Physical Exam  Constitutional: She is oriented to person, place, and time. She appears well-developed and well-nourished.  HENT:  Head: Normocephalic and atraumatic.  Eyes: Conjunctivae and EOM are normal.  Neck: Normal range of motion.  Cardiovascular: Normal rate, regular rhythm, normal heart sounds and intact distal pulses.  Pulmonary/Chest: Effort normal and breath sounds normal.  Abdominal: Soft. Bowel sounds are normal.  Lymphadenopathy:    She has no cervical adenopathy.  Neurological: She is alert and oriented to person, place, and time.  Skin: Skin is warm and dry. Capillary refill takes less than 2 seconds.  Psoriasis in bilateral ears  Psychiatric: She has a normal mood and affect. Her behavior is normal.  Nursing note and vitals reviewed.    Musculoskeletal Exam: C-spine, thoracic, and lumbar spine good ROM.  Shoulder joints, elbow joints, wrist joints, MCPs, PIPs, and DIPs good ROM with no synovitis.  She has tenderness of her left 2nd and 3rd MCPs.  Hip joints, knee joints, ankle joints, MTPs, PIPs, and DIPs good ROM with no synovitis.  No warmth or effusion of knees.  No knee crepitus.  She has mild tenderness of left  trochanteric bursa.  No midline spinal tenderness.  No SI joint tenderness.   CDAI Exam: CDAI Homunculus Exam:   Joint Counts:  CDAI Tender Joint count: 0 CDAI Swollen Joint count: 0  Global Assessments:  Patient Global Assessment: 2 Provider Global Assessment: 2  CDAI Calculated Score: 4    Investigation: No additional findings. CBC Latest Ref Rng & Units 11/04/2017 07/20/2017 01/04/2017  WBC 3.8 - 10.8 Thousand/uL 5.2 5.2 7.1  Hemoglobin 11.7 - 15.5 g/dL 13.7 13.3 13.8  Hematocrit 35.0 - 45.0 % 39.5 39.7 41.5  Platelets 140 - 400 Thousand/uL 184 180 168   CMP Latest Ref Rng & Units 11/04/2017 07/20/2017 01/04/2017  Glucose 65 - 99 mg/dL 87 89 107(H)  BUN 7 - 25 mg/dL 18 19 18   Creatinine 0.50 - 1.10 mg/dL 0.81 0.94 0.86  Sodium  135 - 146 mmol/L 137 137 140  Potassium 3.5 - 5.3 mmol/L 4.1 4.5 4.2  Chloride 98 - 110 mmol/L 102 102 104  CO2 20 - 32 mmol/L 30 25 29   Calcium 8.6 - 10.2 mg/dL 9.1 9.7 9.5  Total Protein 6.1 - 8.1 g/dL 6.4 6.3 6.4  Total Bilirubin 0.2 - 1.2 mg/dL 0.6 0.7 0.5  Alkaline Phos 33 - 115 U/L - 35 34  AST 10 - 35 U/L 26 32 57(H)  ALT 6 - 29 U/L 19 28 64(H)    Imaging: Xr Thoracic Spine 2 View  Result Date: 01/19/2018 Mild dextrothoracic scoliosis was noted.  Some anterior spurring was noted.  No significant disc space narrowing was noted. Impression: These findings are consistent with mild spondylosis and mild scoliosis of the thoracic spine.  Xr Foot 2 Views Left  Result Date: 01/19/2018 First MTP narrowing and subluxation with cystic changes were noted.  None of the other MTP showed joint space narrowing or erosive changes.  PIP and DIP narrowing was noted.  Calcaneal spur was noted. Impression: These findings are consistent with osteoarthritis of the foot.  Xr Foot 2 Views Right  Result Date: 01/19/2018 All PIP and DIP narrowing was noted.  No MTP joint narrowing or erosive changes were noted.  No intertarsal joint space narrowing was noted.  A  calcaneal spur was noted. Impression: These findings are consistent with osteoarthritis of the foot.  Xr Hand 2 View Left  Result Date: 01/19/2018 All PIP and DIP narrowing was noted.  No significant MCP joint narrowing was noted.  No intercarpal radiocarpal joint space narrowing was noted. Impression: These findings are consistent with osteoarthritis of the hand.  Xr Hand 2 View Right  Result Date: 01/19/2018 Cyst versus erosion noted in the first MCP.  Minimal PIP and DIP narrowing was noted.  First second and third MCP narrowing was noted.  No intercarpal radiocarpal joint space narrowing was noted. 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   Speciality Comments: No specialty comments available.    Procedures:  No procedures performed Allergies: Oseltamivir and Tamiflu [oseltamivir phosphate]   Assessment / Plan:     Visit Diagnoses: Psoriatic arthritis ([place]) - X-rays of her bilateral hands and feet were performed today in the office. X-rays revealed findings consistent with psoriatic and osteoarthritis overlap in her hands and feet.  She also has a possible erosion of her right first MCP.  She has no synovitis or dactylitis on exam.  She will continue on Humira every 2 weeks.    Psoriasis: She continues to have psoriasis in bilateral ear canals.  No other patches noted.   High risk medication use - Humira every 2 weeks. CBC and CMP 11/04/17. She is due in March for TB gold.  Her TB gold was negative on 11/04/17.   She will return for labs in March and every 3 months to monitor for drug toxicity.     Trochanteric bursitis of both hips: Improving.  She has tenderness of her left trochanteric bursa.    Pain in both hands - She has tenderness in left 2nd and 3rd MCPs.  No synovitis on exam.  X-rays revealed findings consistent with psoriatic arthritis and osteoarthritis overlap. Plan: XR Hand 2 View Left, XR Hand 2 View Right  Pain in  both feet - No dactylitis on exam. She has occasional discomfort in bilateral feet.  She also has been having pain in her right ankle.  X-rays of  bilateral feet revealed changes consistent with osteoarthritis. Plan: XR Foot 2 Views Left, XR Foot 2 Views Right  Pain in thoracic spine -Patient reports that 10 days ago she started to experience pain in the thoracic region.  No injury or overuse activity.  She would like to go to physical therapy.  A referral was placed. X-ray revealed mild scoliosis and spondylosis of the thoracic spine.   Plan: XR Thoracic Spine 2 View    Orders: Orders Placed This Encounter  Procedures  . XR Hand 2 View Left  . XR Hand 2 View Right  . XR Foot 2 Views Left  . XR Foot 2 Views Right  . XR Thoracic Spine 2 View   No orders of the defined types were placed in this encounter.   Face-to-face time spent with patient was 30 minutes. Greater than 50% of time was spent in counseling and coordination of care.  Follow-Up Instructions: Return for Psoriatic arthritis.   Ofilia Neas, PA-C  Patient with known history of disc disease of cervical spine which causes discomfort and radiculopathy to the right shoulder.  She has been having some discomfort in the right rib cage area.  This could be related to the disc disease in her thoracic spine.  We will see response to physical therapy.  If she has an adequate response we can plan on obtaining MRI of the thoracic spine.  There was no active synovitis on examination.  I have also advised her to follow-up with her PCP regarding this. I examined and evaluated the patient with Hazel Sams PA. The plan of care was discussed as noted above.  Bo Merino, MD Note - This record has been created using Editor, commissioning.  Chart creation errors have been sought, but may not always  have been located. Such creation errors do not reflect on  the standard of medical care.

## 2018-01-19 ENCOUNTER — Ambulatory Visit (INDEPENDENT_AMBULATORY_CARE_PROVIDER_SITE_OTHER): Payer: Self-pay

## 2018-01-19 ENCOUNTER — Encounter: Payer: Self-pay | Admitting: Physician Assistant

## 2018-01-19 ENCOUNTER — Ambulatory Visit (INDEPENDENT_AMBULATORY_CARE_PROVIDER_SITE_OTHER): Payer: BLUE CROSS/BLUE SHIELD | Admitting: Rheumatology

## 2018-01-19 VITALS — BP 118/75 | HR 75 | Resp 15 | Ht 67.5 in | Wt 149.0 lb

## 2018-01-19 DIAGNOSIS — M79672 Pain in left foot: Secondary | ICD-10-CM | POA: Diagnosis not present

## 2018-01-19 DIAGNOSIS — L4050 Arthropathic psoriasis, unspecified: Secondary | ICD-10-CM | POA: Diagnosis not present

## 2018-01-19 DIAGNOSIS — M79642 Pain in left hand: Secondary | ICD-10-CM

## 2018-01-19 DIAGNOSIS — M7062 Trochanteric bursitis, left hip: Secondary | ICD-10-CM | POA: Diagnosis not present

## 2018-01-19 DIAGNOSIS — M7061 Trochanteric bursitis, right hip: Secondary | ICD-10-CM

## 2018-01-19 DIAGNOSIS — L409 Psoriasis, unspecified: Secondary | ICD-10-CM

## 2018-01-19 DIAGNOSIS — Z79899 Other long term (current) drug therapy: Secondary | ICD-10-CM

## 2018-01-19 DIAGNOSIS — M79641 Pain in right hand: Secondary | ICD-10-CM

## 2018-01-19 DIAGNOSIS — M546 Pain in thoracic spine: Secondary | ICD-10-CM | POA: Diagnosis not present

## 2018-01-19 DIAGNOSIS — M79671 Pain in right foot: Secondary | ICD-10-CM

## 2018-01-19 NOTE — Patient Instructions (Signed)
Standing Labs We placed an order today for your standing lab work.    Please come back and get your standing labs in March and every 3 months  We have open lab Monday through Friday from 8:30-11:30 AM and 1:30-4 PM at the office of Dr. Shaili Deveshwar.   The office is located at [address], [address], [place], [place] [address] No appointment is necessary.   Labs are drawn by [facility].  You may receive a bill from [facility] for your lab work. If you have any questions regarding directions or hours of operation,  please call [phone].    

## 2018-03-03 DIAGNOSIS — F902 Attention-deficit hyperactivity disorder, combined type: Secondary | ICD-10-CM | POA: Diagnosis not present

## 2018-03-31 ENCOUNTER — Other Ambulatory Visit: Payer: Self-pay | Admitting: Rheumatology

## 2018-03-31 NOTE — Telephone Encounter (Addendum)
Last Visit: 01/19/18 Next Visit due June 2019. Message sent to the front to schedule patient. Labs: 11/04/17 WNL TB Gold: 11/04/17 Neg   Left message to advise patient she is due for follow visit and labs.   Okay to refill 30 day supply per Dr. Estanislado Pandy

## 2018-06-22 ENCOUNTER — Other Ambulatory Visit: Payer: Self-pay

## 2018-06-22 DIAGNOSIS — Z79899 Other long term (current) drug therapy: Secondary | ICD-10-CM

## 2018-06-22 LAB — CBC WITH DIFFERENTIAL/PLATELET
BASOS ABS: 36 {cells}/uL (ref 0–200)
Basophils Relative: 0.4 %
EOS ABS: 18 {cells}/uL (ref 15–500)
EOS PCT: 0.2 %
HEMATOCRIT: 41.1 % (ref 35.0–45.0)
Hemoglobin: 14 g/dL (ref 11.7–15.5)
LYMPHS ABS: 1380 {cells}/uL (ref 850–3900)
MCH: 31.8 pg (ref 27.0–33.0)
MCHC: 34.1 g/dL (ref 32.0–36.0)
MCV: 93.4 fL (ref 80.0–100.0)
MPV: 10.9 fL (ref 7.5–12.5)
Monocytes Relative: 7.4 %
NEUTROS ABS: 6809 {cells}/uL (ref 1500–7800)
Neutrophils Relative %: 76.5 %
PLATELETS: 176 10*3/uL (ref 140–400)
RBC: 4.4 10*6/uL (ref 3.80–5.10)
RDW: 11.6 % (ref 11.0–15.0)
Total Lymphocyte: 15.5 %
WBC mixed population: 659 cells/uL (ref 200–950)
WBC: 8.9 10*3/uL (ref 3.8–10.8)

## 2018-06-22 LAB — COMPLETE METABOLIC PANEL WITH GFR
AG Ratio: 2.2 (calc) (ref 1.0–2.5)
ALBUMIN MSPROF: 4.4 g/dL (ref 3.6–5.1)
ALT: 29 U/L (ref 6–29)
AST: 30 U/L (ref 10–35)
Alkaline phosphatase (APISO): 40 U/L (ref 33–115)
BILIRUBIN TOTAL: 0.8 mg/dL (ref 0.2–1.2)
BUN: 19 mg/dL (ref 7–25)
CHLORIDE: 102 mmol/L (ref 98–110)
CO2: 29 mmol/L (ref 20–32)
Calcium: 9.8 mg/dL (ref 8.6–10.2)
Creat: 0.88 mg/dL (ref 0.50–1.10)
GFR, EST AFRICAN AMERICAN: 91 mL/min/{1.73_m2} (ref >=60)
GFR, Est Non African American: 79 mL/min/{1.73_m2} (ref >=60)
GLOBULIN: 2 g/dL (ref 1.9–3.7)
Glucose, Bld: 99 mg/dL (ref 65–99)
Potassium: 4.7 mmol/L (ref 3.5–5.3)
Sodium: 138 mmol/L (ref 135–146)
TOTAL PROTEIN: 6.4 g/dL (ref 6.1–8.1)

## 2018-06-23 NOTE — Progress Notes (Signed)
Labs are WNL.

## 2018-06-28 DIAGNOSIS — M545 Low back pain: Secondary | ICD-10-CM | POA: Diagnosis not present

## 2018-06-28 DIAGNOSIS — M5412 Radiculopathy, cervical region: Secondary | ICD-10-CM | POA: Diagnosis not present

## 2018-07-07 DIAGNOSIS — M5412 Radiculopathy, cervical region: Secondary | ICD-10-CM | POA: Diagnosis not present

## 2018-07-07 DIAGNOSIS — M545 Low back pain: Secondary | ICD-10-CM | POA: Diagnosis not present

## 2018-07-14 ENCOUNTER — Other Ambulatory Visit: Payer: Self-pay | Admitting: Rheumatology

## 2018-07-14 NOTE — Telephone Encounter (Signed)
Last visit: 01/19/2018 Next visit: message sent to the front desk to schedule patient.  Labs: 06/22/2018 WNL  TB Gold: 11/04/2017 negative   Okay to refill per Dr. Estanislado Pandy.

## 2018-07-18 DIAGNOSIS — M545 Low back pain: Secondary | ICD-10-CM | POA: Diagnosis not present

## 2018-07-18 DIAGNOSIS — M5412 Radiculopathy, cervical region: Secondary | ICD-10-CM | POA: Diagnosis not present

## 2018-07-19 ENCOUNTER — Telehealth: Payer: Self-pay | Admitting: Rheumatology

## 2018-07-19 NOTE — Telephone Encounter (Signed)
-----   Message from [place] sent at 07/14/2018  8:07 AM EDT ----- Regarding: follow up Please call patient and schedule follow up appointment, she is due now. Thanks!

## 2018-07-19 NOTE — Telephone Encounter (Signed)
LMOM for patient to call and schedule follow-up appointment.   °

## 2018-08-16 ENCOUNTER — Telehealth: Payer: Self-pay | Admitting: Pharmacy Technician

## 2018-08-16 ENCOUNTER — Other Ambulatory Visit: Payer: Self-pay | Admitting: Pharmacist

## 2018-08-16 DIAGNOSIS — L4050 Arthropathic psoriasis, unspecified: Secondary | ICD-10-CM

## 2018-08-16 MED ORDER — ADALIMUMAB 40 MG/0.4ML ~~LOC~~ AJKT: 40.0000 mg | AUTO-INJECTOR | SUBCUTANEOUS | 0 refills | Status: DC

## 2018-08-16 NOTE — Progress Notes (Signed)
Received PA for Humira 40mg /0.51ml sent on 07/14/18.  PA already approved for Humira 40mg /0.61ml. Discontinued 0.54ml and sent RX for 0.27ml.

## 2018-08-16 NOTE — Telephone Encounter (Signed)
Received a prior authorization, from [facility] for Humira 40mg /0.4ml. But, patient has been injecting Humira CF 40mg /0.17ml. There are no notes noting any changes to her Humira.   Can you please send in prescription for Humira 40mg /0.22ml CF? Patient has an active PA on file.  Thanks!  1:40 PM Beatriz Chancellor, CPhT

## 2018-08-24 ENCOUNTER — Telehealth: Payer: Self-pay | Admitting: Rheumatology

## 2018-08-24 NOTE — Telephone Encounter (Signed)
Authorization has been submitted to patient's insurance via Cover My Meds as her insurance plan does not allow them to be initiated over the phone.  Sent as urgent.  Key: [id number]. Will update once we receive a response.

## 2018-08-24 NOTE — Telephone Encounter (Signed)
Tiana from [facility] called stating patient's insurance plan started over on 07/30/18 so a new prior authorization is needed for patient's Humira.   Please call # [phone] and submit it as urgent.

## 2018-08-25 NOTE — Telephone Encounter (Signed)
Received a fax from [facility] [employer] [place] regarding a prior authorization for Humira. Authorization has been APPROVED from 08/24/08 to 11/28/2038.   Will send document to scan center.  Authorization # [id number] Phone # [phone]

## 2018-11-08 ENCOUNTER — Encounter (HOSPITAL_COMMUNITY): Payer: Self-pay

## 2018-11-08 ENCOUNTER — Ambulatory Visit (HOSPITAL_COMMUNITY)
Admission: EM | Admit: 2018-11-08 | Discharge: 2018-11-08 | Disposition: A | Discharge status: Home / Self Care | Payer: BLUE CROSS/BLUE SHIELD | Attending: Family Medicine | Admitting: Family Medicine

## 2018-11-08 DIAGNOSIS — S61209A Unspecified open wound of unspecified finger without damage to nail, initial encounter: Secondary | ICD-10-CM

## 2018-11-08 DIAGNOSIS — W268XXA Contact with other sharp object(s), not elsewhere classified, initial encounter: Secondary | ICD-10-CM | POA: Diagnosis not present

## 2018-11-08 DIAGNOSIS — Y92009 Unspecified place in unspecified non-institutional (private) residence as the place of occurrence of the external cause: Secondary | ICD-10-CM | POA: Diagnosis not present

## 2018-11-08 MED ORDER — LIDOCAINE HCL 2 % IJ SOLN
INTRAMUSCULAR | Status: AC
  Filled 2018-11-08: qty 20

## 2018-11-08 NOTE — ED Provider Notes (Signed)
[facility]   [id number] 11/08/18 Arrival Time: 5427  ASSESSMENT & PLAN:  1. Fingertip avulsion, initial encounter   With partial nail/nailbed involvement. No exposed bone.  Digital block of her R thumb performed with 2% lidocaine without epinephrine. Thumb and wound cleaned. Minimal active bleeding; controlled. Nonstick dressing with Surgicel applied.   To call tomorrow morning: Follow-up Information    Iran Planas, MD.   Specialty:  Orthopedic Surgery Why:  Please call tomorrow morning for same day evaluation of your thumb avulsion injury. Contact information: [address] [place] [address] [place] [address] [phone]        [facility].   Specialty:  Urgent Care Why:  As needed. Contact information: [place] [phone]         Keep bandage and wound clean/dry until orthopaedic follow up.  Reviewed expectations re: course of current medical issues. Questions answered. Outlined signs and symptoms indicating need for more acute intervention. Patient verbalized understanding. After Visit Summary given.   SUBJECTIVE:  Samantha Graves is a 47 y.o. female who presents with a fingertip avulsion injury of her R thumb. She is R-handed. Using mandolin slicer at home catching the tip of her thumb. Injury occurred approx 78m-1h ago. Pressure applied. Significant tenderness/pain to tip of her R thumb. No numbness/tingling.  Td UTD: Yes.  ROS: As per HPI.   OBJECTIVE:  Vitals:   11/08/18 1607  BP: 132/81  Pulse: 71  Resp: 20  Temp: 98.3 F (36.8 C)  TempSrc: Oral  SpO2: 99%    General appearance: alert; no distress Skin: R thumb with avulsion injury of outer tip with nail/naiilbed involvement; size: approx 1 cm and oval; normal capillary refill and sensation; R thumb with FROM; minimal active bleeding Psychological: alert and cooperative; normal mood and  affect  No results found.  Allergies  Allergen Reactions  . Oseltamivir   . Tamiflu [Oseltamivir Phosphate]     Past Medical History:  Diagnosis Date  . Arthritis 2003   psoriatic, Dr. Estanislado Pandy   Social History   Socioeconomic History  . Marital status: Married    Spouse name: Not on file  . Number of children: Not on file  . Years of education: Not on file  . Highest education level: Not on file  Occupational History  . Not on file  Social Needs  . Financial resource strain: Not on file  . Food insecurity:    Worry: Not on file    Inability: Not on file  . Transportation needs:    Medical: Not on file    Non-medical: Not on file  Tobacco Use  . Smoking status: Never Smoker  . Smokeless tobacco: Never Used  Substance and Sexual Activity  . Alcohol use: Yes    Alcohol/week: 5.0 - 7.0 standard drinks    Types: 5 - 7 Glasses of wine per week  . Drug use: No  . Sexual activity: Yes    Birth control/protection: IUD  Lifestyle  . Physical activity:    Days per week: Not on file    Minutes per session: Not on file  . Stress: Not on file  Relationships  . Social connections:    Talks on phone: Not on file    Gets together: Not on file    Attends religious service: Not on file    Active member of club or organization: Not on file    Attends meetings of  clubs or organizations: Not on file    Relationship status: Not on file  Other Topics Concern  . Not on file  Social History Narrative  . Not on file         Vanessa Kick, MD 11/09/18 1309

## 2018-11-08 NOTE — ED Triage Notes (Signed)
Pt presents with right thumb laceration.

## 2018-11-14 ENCOUNTER — Other Ambulatory Visit: Payer: Self-pay | Admitting: Rheumatology

## 2018-11-14 DIAGNOSIS — L4050 Arthropathic psoriasis, unspecified: Secondary | ICD-10-CM

## 2018-11-14 NOTE — Telephone Encounter (Addendum)
Last visit: 01/19/2018 Next visit: message sent to the front desk to schedule patient.  Labs: 06/22/2018 WNL  TB Gold: 11/04/2017 negative   Left message for patient to advise patient she is due for a follow up and labs.

## 2018-11-29 DIAGNOSIS — S3613XA Injury of bile duct, initial encounter: Secondary | ICD-10-CM

## 2018-11-29 HISTORY — DX: Injury of bile duct, initial encounter: S36.13XA

## 2018-12-05 ENCOUNTER — Telehealth: Payer: Self-pay | Admitting: Rheumatology

## 2018-12-05 ENCOUNTER — Other Ambulatory Visit: Payer: Self-pay | Admitting: Rheumatology

## 2018-12-05 DIAGNOSIS — L4050 Arthropathic psoriasis, unspecified: Secondary | ICD-10-CM

## 2018-12-05 NOTE — Telephone Encounter (Signed)
Last visit: 01/19/2018 Next visit: 12/06/18  Labs: 06/22/2018 WNL  TB Gold: 11/04/2017 negative   Patient to update labs at her appointment and then prescription to be refilled.

## 2018-12-05 NOTE — Telephone Encounter (Signed)
[facility] called requesting prescription refill of Humira for the patient.

## 2018-12-05 NOTE — Telephone Encounter (Signed)
Last visit: 01/19/2018 Next visit: 1/58/20 Labs: 06/22/2018 WNL  TB Gold: 11/04/2017 negative   patient to update labs at her visit and then prescription to be refilled.

## 2018-12-06 ENCOUNTER — Encounter: Payer: Self-pay | Admitting: Physician Assistant

## 2018-12-06 ENCOUNTER — Ambulatory Visit (INDEPENDENT_AMBULATORY_CARE_PROVIDER_SITE_OTHER): Payer: BLUE CROSS/BLUE SHIELD | Admitting: Physician Assistant

## 2018-12-06 VITALS — BP 120/74 | HR 73 | Resp 12 | Ht 67.5 in | Wt 149.0 lb

## 2018-12-06 DIAGNOSIS — Z79899 Other long term (current) drug therapy: Secondary | ICD-10-CM

## 2018-12-06 DIAGNOSIS — L409 Psoriasis, unspecified: Secondary | ICD-10-CM | POA: Diagnosis not present

## 2018-12-06 DIAGNOSIS — M7061 Trochanteric bursitis, right hip: Secondary | ICD-10-CM

## 2018-12-06 DIAGNOSIS — L4050 Arthropathic psoriasis, unspecified: Secondary | ICD-10-CM

## 2018-12-06 DIAGNOSIS — M7062 Trochanteric bursitis, left hip: Secondary | ICD-10-CM

## 2018-12-06 MED ORDER — ADALIMUMAB 40 MG/0.4ML ~~LOC~~ AJKT: 40.0000 mg | AUTO-INJECTOR | SUBCUTANEOUS | 0 refills | Status: DC

## 2018-12-06 NOTE — Patient Instructions (Signed)
Standing Labs We placed an order today for your standing lab work.    Please come back and get your standing labs in April and every 3 months  We have open lab Monday through Friday from 8:30-11:30 AM and 1:30-4:00 PM  at the office of Dr. Shaili Deveshwar.   You may experience shorter wait times on Monday and Friday afternoons. The office is located at [address], [address], [place], [place] [address] No appointment is necessary.   Labs are drawn by [facility].  You may receive a bill from [facility] for your lab work.  If you wish to have your labs drawn at another location, please call the office 24 hours in advance to send orders.  If you have any questions regarding directions or hours of operation,  please call [phone].   Just as a reminder please drink plenty of water prior to coming for your lab work. Thanks!  

## 2018-12-06 NOTE — Progress Notes (Signed)
Office Visit Note  Patient: Samantha Graves             Date of Birth: [birth year]           MRN: [id number]             PCP: Patient, No Pcp Per Referring: No ref. provider found Visit Date: 12/06/2018 Occupation: @GUAROCC @  Subjective:  Joint stiffness   History of Present Illness: Samantha Graves is a 48 y.o. female with history of psoriatic arthritis.  She has been injecting Humira every 14 days.  She had to miss 1 dose of Humira after injuring her right thumb.  She was worried about the wound getting infected.  She denies any recent infections.  She did not receive the influenza vaccination this year.  She states she has been having increased stiffness in both hands and the right ankle joint with the colder weather.  She states that 3 years ago she sprained her right ankle and continues to have morning stiffness in that ankle but denies any joint swelling. She has been running more frequently lately and feels that her achilles tendons are tight but she denies any pain or inflammation. She is training for a half marathon. She denies any plantar fasciitis.  She has mild SI joint pain when she sits for prolonged periods of time.  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     Activities of Daily Living:  Patient reports morning stiffness for 30-60  minutes.   Patient Reports nocturnal pain.  Difficulty dressing/grooming: Denies Difficulty climbing stairs: Denies Difficulty getting out of chair: Denies Difficulty using hands for taps, buttons, cutlery, and/or writing: Denies  Review of Systems  Constitutional: Negative for fatigue.  HENT: Negative for mouth sores, mouth dryness and nose dryness.   Eyes: Positive for redness. Negative for pain, visual disturbance and dryness.  Respiratory: Negative for cough, hemoptysis, shortness of breath and difficulty breathing.   Cardiovascular: Negative for chest pain, palpitations, hypertension and swelling in legs/feet.    Gastrointestinal: Negative for blood in stool, constipation and diarrhea.  Endocrine: Negative for increased urination.  Genitourinary: Negative for painful urination.  Musculoskeletal: Positive for arthralgias, joint pain and morning stiffness. Negative for joint swelling, myalgias, muscle weakness, muscle tenderness and myalgias.  Skin: Negative for color change, pallor, rash, hair loss, nodules/bumps, skin tightness, ulcers and sensitivity to sunlight.  Allergic/Immunologic: Negative for susceptible to infections.  Neurological: Negative for dizziness, numbness, headaches and weakness.  Hematological: Negative for swollen glands.  Psychiatric/Behavioral: Negative for depressed mood and sleep disturbance. The patient is not nervous/anxious.     PMFS History:  Patient Active Problem List   Diagnosis Date Noted  . Psoriasis 01/11/2017  . High risk medication use 01/11/2017  . PSORIATIC ARTHRITIS 10/14/2009  . ALLERGIC RHINITIS 09/06/2007    Past Medical History:  Diagnosis Date  . Arthritis 2003   psoriatic, Dr. Estanislado Pandy    Family History  Problem Relation Age of Onset  . Hyperlipidemia Mother   . Ovarian cysts Mother        benign  . Endometriosis Mother   . Other Father        auto imune  . Coronary artery disease Father        cardio ablation  . COPD Maternal Grandmother   . Bone cancer Maternal Grandfather 27  . Arthritis/Rheumatoid Sister 51  . Endometriosis Sister   . Retinitis pigmentosa Sister 84  . Ulcerative colitis Sister 61  surgical repair, J pouch  . Ovarian cancer Sister 92       negative genetic testing   Past Surgical History:  Procedure Laterality Date  . INTRAUTERINE DEVICE INSERTION  05/30/15, 05/06/10   MIrena IUD  . TONSILLECTOMY AND ADENOIDECTOMY  1990  . WISDOM TOOTH EXTRACTION Bilateral 1991   Social History   Social History Narrative  . Not on file    Objective: Vital Signs: BP 120/74 (BP Location: Left Arm, Patient Position:  Sitting, Cuff Size: Normal)   Pulse 73   Resp 12   Ht 5' 7.5" (1.715 m)   Wt 149 lb (67.6 kg)   BMI 22.99 kg/m    Physical Exam Vitals signs and nursing note reviewed.  Constitutional:      Appearance: She is well-developed.  HENT:     Head: Normocephalic and atraumatic.  Eyes:     Conjunctiva/sclera: Conjunctivae normal.  Neck:     Musculoskeletal: Normal range of motion.  Cardiovascular:     Rate and Rhythm: Normal rate and regular rhythm.     Heart sounds: Normal heart sounds.  Pulmonary:     Effort: Pulmonary effort is normal.     Breath sounds: Normal breath sounds.  Abdominal:     General: Bowel sounds are normal.     Palpations: Abdomen is soft.  Lymphadenopathy:     Cervical: No cervical adenopathy.  Skin:    General: Skin is warm and dry.     Capillary Refill: Capillary refill takes less than 2 seconds.     Comments: Psoriasis present in ear canals  Neurological:     Mental Status: She is alert and oriented to person, place, and time.  Psychiatric:        Behavior: Behavior normal.      Musculoskeletal Exam: C-spine, thoracic spine, and lumbar spine good ROM.  No midline spinal tenderness.  No SI joint tenderness.  Shoulder joints, elbow joints, wrist joints, MCPs, PIPs, and DIPs good ROM with no synovitis.  Hip joints, knee joints, ankle joints, MTPs, PIPs, and DIPs good ROM with no synovitis.  No warmth or effusion of knee joints.  No tenderness or swelling of ankle joints.  No achilles tendonitis or plantar fasciitis.    CDAI Exam: CDAI Score: 0.4  Patient Global Assessment: 2 (mm); Provider Global Assessment: 2 (mm) Swollen: 0 ; Tender: 0  Joint Exam   Not documented   There is currently no information documented on the homunculus. Go to the Rheumatology activity and complete the homunculus joint exam.  Investigation: No additional findings.  Imaging: No results found.  Recent Labs: Lab Results  Component Value Date   WBC 8.9 06/22/2018   HGB  14.0 06/22/2018   PLT 176 06/22/2018   NA 138 06/22/2018   K 4.7 06/22/2018   CL 102 06/22/2018   CO2 29 06/22/2018   GLUCOSE 99 06/22/2018   BUN 19 06/22/2018   CREATININE 0.88 06/22/2018   BILITOT 0.8 06/22/2018   ALKPHOS 35 07/20/2017   AST 30 06/22/2018   ALT 29 06/22/2018   PROT 6.4 06/22/2018   ALBUMIN 4.3 07/20/2017   CALCIUM 9.8 06/22/2018   GFRAA 91 06/22/2018   QFTBGOLD NEGATIVE 11/04/2017    Speciality Comments: No specialty comments available.  Procedures:  No procedures performed Allergies: Oseltamivir and Tamiflu [oseltamivir phosphate]   Assessment / Plan:     Visit Diagnoses: Psoriatic arthritis ([place]) -She has no synovitis or dactylitis on exam.  She has not had any recent  psoriatic arthritis flares.  She is clinically doing well on Humira 40 mg sq injections every 14 days.  She has no joint pain or joint swelling at this time.  She has no achilles tendonitis or plantar fasciitis.  She has no SI joint tenderness on exam.  She had x-rays of both hands and feet at the last visit which revealed findings consistent with psoriatic arthritis and osteoarthritis overlap. She will continue on this current treatment regimen. We discussed spacing her dosing of Humira but she reports she starts to flare if she is even 2-3 days late injecting Humira. She was advised to notify us if she develops increased joint pain or joint swelling. She will follow up in 5 months.    Psoriasis: She continues to have psoriasis in bilateral ear canals.   High risk medication use - Humira 40 mg sq injections every 2 weeks.  CBC and CMP were drawn today to monitor for drug toxicity. TB gold will be drawn today.  She will return in April and every 3 months for CBC and CMP.  - Plan: COMPLETE METABOLIC PANEL WITH GFR, CBC with Differential/Platelet, QuantiFERON-TB Gold Plus  Trochanteric bursitis of both hips: She has no tenderness on exam today.     Orders: Orders Placed This Encounter    Procedures  . COMPLETE METABOLIC PANEL WITH GFR  . CBC with Differential/Platelet  . QuantiFERON-TB Gold Plus   Meds ordered this encounter  Medications  . Adalimumab (HUMIRA PEN) 40 MG/0.4ML PNKT    Sig: Inject 40 mg into the skin every 14 (fourteen) days.    Dispense:  3 each    Refill:  0    3 kits(6 pens)      Follow-Up Instructions: Return in about 5 months (around 05/07/2019) for Psoriatic arthritis.   Ofilia Neas, PA-C  Note - This record has been created using Dragon software.  Chart creation errors have been sought, but may not always  have been located. Such creation errors do not reflect on  the standard of medical care.

## 2018-12-07 NOTE — Progress Notes (Signed)
AST is borderline elevated.  Please advise patient to avoid tylenol, NSAIDs, and alcohol. CBC is WNL. Please forward labs to PCP. We will continue to monitor.

## 2018-12-08 LAB — CBC WITH DIFFERENTIAL/PLATELET
ABSOLUTE MONOCYTES: 388 {cells}/uL (ref 200–950)
BASOS ABS: 41 {cells}/uL (ref 0–200)
Basophils Relative: 0.6 %
Eosinophils Absolute: 41 cells/uL (ref 15–500)
Eosinophils Relative: 0.6 %
HEMATOCRIT: 41 % (ref 35.0–45.0)
Hemoglobin: 14.2 g/dL (ref 11.7–15.5)
LYMPHS ABS: 1748 {cells}/uL (ref 850–3900)
MCH: 32.6 pg (ref 27.0–33.0)
MCHC: 34.6 g/dL (ref 32.0–36.0)
MCV: 94 fL (ref 80.0–100.0)
MPV: 10.9 fL (ref 7.5–12.5)
Monocytes Relative: 5.7 %
NEUTROS PCT: 67.4 %
Neutro Abs: 4583 cells/uL (ref 1500–7800)
PLATELETS: 209 10*3/uL (ref 140–400)
RBC: 4.36 10*6/uL (ref 3.80–5.10)
RDW: 11.5 % (ref 11.0–15.0)
TOTAL LYMPHOCYTE: 25.7 %
WBC: 6.8 10*3/uL (ref 3.8–10.8)

## 2018-12-08 LAB — COMPLETE METABOLIC PANEL WITH GFR
AG Ratio: 1.9 (calc) (ref 1.0–2.5)
ALT: 25 U/L (ref 6–29)
AST: 38 U/L — ABNORMAL HIGH (ref 10–35)
Albumin: 4.2 g/dL (ref 3.6–5.1)
Alkaline phosphatase (APISO): 39 U/L (ref 33–115)
BUN: 11 mg/dL (ref 7–25)
CALCIUM: 9.3 mg/dL (ref 8.6–10.2)
CHLORIDE: 103 mmol/L (ref 98–110)
CO2: 28 mmol/L (ref 20–32)
CREATININE: 0.77 mg/dL (ref 0.50–1.10)
GFR, EST AFRICAN AMERICAN: 107 mL/min/{1.73_m2} (ref >=60)
GFR, Est Non African American: 92 mL/min/{1.73_m2} (ref >=60)
GLUCOSE: 83 mg/dL (ref 65–99)
Globulin: 2.2 g/dL (calc) (ref 1.9–3.7)
Potassium: 4 mmol/L (ref 3.5–5.3)
SODIUM: 139 mmol/L (ref 135–146)
TOTAL PROTEIN: 6.4 g/dL (ref 6.1–8.1)
Total Bilirubin: 0.6 mg/dL (ref 0.2–1.2)

## 2018-12-08 LAB — QUANTIFERON-TB GOLD PLUS
MITOGEN-NIL: 7.29 [IU]/mL
NIL: 0.02 [IU]/mL
QuantiFERON-TB Gold Plus: NEGATIVE
TB1-NIL: 0 IU/mL
TB2-NIL: 0 IU/mL

## 2018-12-08 NOTE — Progress Notes (Signed)
TB gold negative

## 2019-01-25 DIAGNOSIS — J09X2 Influenza due to identified novel influenza A virus with other respiratory manifestations: Secondary | ICD-10-CM | POA: Diagnosis not present

## 2019-01-25 DIAGNOSIS — Z20828 Contact with and (suspected) exposure to other viral communicable diseases: Secondary | ICD-10-CM | POA: Diagnosis not present

## 2019-04-19 DIAGNOSIS — M545 Low back pain: Secondary | ICD-10-CM | POA: Diagnosis not present

## 2019-04-26 NOTE — Progress Notes (Signed)
Office Visit Note  Patient: Samantha Graves             Date of Birth: June 10, 1971           MRN: [id number]             PCP: Patient, No Pcp Per Referring: No ref. provider found Visit Date: 05/10/2019 Occupation: @GUAROCC @  Subjective:  Bilateral trochanteric bursitis     History of Present Illness: Samantha Graves is a 49 y.o. female with history of psoriatic arthritis and trochanteric bursitis.  She was previously off Humira in November and December due to abnormal lab work.  She resumed in January and had 2 injections.  She states she had the flu in February and held Humira.  She has been off of Humira since then.  She was apprehensive to restart because of COVID-19.  She is having increased neck, lower back, and bilateral trochanteric bursa.  She continues to have psoriasis in both ear canals and on elbow joints.  She denies any joint swelling.  She denies any achilles tendonitis or plantar fasciitis.  She has been training for a marathon.    Activities of Daily Living:  Patient reports morning stiffness for 30 minutes.   Patient Reports nocturnal pain.  Difficulty dressing/grooming: Denies Difficulty climbing stairs: Denies Difficulty getting out of chair: Denies Difficulty using hands for taps, buttons, cutlery, and/or writing: Denies  Review of Systems  Constitutional: Positive for fatigue.  HENT: Negative for mouth dryness.   Eyes: Negative for pain and dryness.  Respiratory: Positive for wheezing. Negative for shortness of breath and difficulty breathing.   Cardiovascular: Negative for swelling in legs/feet.  Gastrointestinal: Positive for constipation and diarrhea. Negative for abdominal pain.  Endocrine: Negative for excessive thirst.  Genitourinary: Negative for difficulty urinating.  Musculoskeletal: Positive for arthralgias, joint pain, morning stiffness and muscle tenderness. Negative for joint swelling and muscle weakness.  Skin: Negative for rash and  redness.  Allergic/Immunologic: Negative for susceptible to infections.  Neurological: Negative for dizziness and headaches.  Hematological: Negative for bruising/bleeding tendency.  Psychiatric/Behavioral: Positive for sleep disturbance.    PMFS History:  Patient Active Problem List   Diagnosis Date Noted  . Psoriasis 01/11/2017  . High risk medication use 01/11/2017  . PSORIATIC ARTHRITIS 10/14/2009  . ALLERGIC RHINITIS 09/06/2007    Past Medical History:  Diagnosis Date  . Arthritis 2003   psoriatic, Dr. Estanislado Pandy    Family History  Problem Relation Age of Onset  . Hyperlipidemia Mother   . Ovarian cysts Mother        benign  . Endometriosis Mother   . Other Father        auto imune  . Coronary artery disease Father        cardio ablation  . COPD Maternal Grandmother   . Bone cancer Maternal Grandfather 80  . Arthritis/Rheumatoid Sister 39  . Endometriosis Sister   . Retinitis pigmentosa Sister 2  . Ulcerative colitis Sister 21       surgical repair, J pouch  . Ovarian cancer Sister 68       negative genetic testing   Past Surgical History:  Procedure Laterality Date  . INTRAUTERINE DEVICE INSERTION  05/30/15, 05/06/10   MIrena IUD  . TONSILLECTOMY AND ADENOIDECTOMY  1990  . WISDOM TOOTH EXTRACTION Bilateral 1991   Social History   Social History Narrative  . Not on file   Immunization History  Administered Date(s) Administered  . Influenza Whole 08/30/2003  .  Influenza,inj,Quad PF,6+ Mos 09/01/2015  . Tdap 02/25/2011     Objective: Vital Signs: BP 125/82 (BP Location: Left Arm, Patient Position: Sitting, Cuff Size: Normal)   Pulse 72   Resp 12   Ht 5' 7.5" (1.715 m)   Wt 150 lb (68 kg)   BMI 23.15 kg/m    Physical Exam Vitals signs and nursing note reviewed.  Constitutional:      Appearance: She is well-developed.  HENT:     Head: Normocephalic and atraumatic.  Eyes:     Conjunctiva/sclera: Conjunctivae normal.  Neck:     Musculoskeletal:  Normal range of motion.  Cardiovascular:     Rate and Rhythm: Normal rate and regular rhythm.     Heart sounds: Normal heart sounds.  Pulmonary:     Effort: Pulmonary effort is normal.     Breath sounds: Normal breath sounds.  Abdominal:     General: Bowel sounds are normal.     Palpations: Abdomen is soft.  Lymphadenopathy:     Cervical: No cervical adenopathy.  Skin:    General: Skin is warm and dry.     Capillary Refill: Capillary refill takes less than 2 seconds.     Comments: Psoriasis in bilateral ear canals   Neurological:     Mental Status: She is alert and oriented to person, place, and time.  Psychiatric:        Behavior: Behavior normal.      Musculoskeletal Exam: C-spine, thoracic spine, and lumbar spine good ROM.  No midline spinal tenderness.  Shoulder joints, elbow joints, wrist joints, MCPs, PIPs, and DIPs good ROM with no synovitis. PIP and DIP synovial thickening consistent with osteoarthritis. Hip joints, knee joints, ankle joints, MTPs, PIPs, and DIPs good ROM with no synovitis.  No warmth or effusion of knee joints.  No tenderness or swelling of ankle joints.  No achilles tendonitis or plantar fasciitis.  Tenderness over bilateral trochanteric bursa.  CDAI Exam: CDAI Score: - Patient Global: -; Provider Global: - Swollen: -; Tender: - Joint Exam   No joint exam has been documented for this visit   There is currently no information documented on the homunculus. Go to the Rheumatology activity and complete the homunculus joint exam.  Investigation: No additional findings.  Imaging: No results found.  Recent Labs: Lab Results  Component Value Date   WBC 6.8 12/06/2018   HGB 14.2 12/06/2018   PLT 209 12/06/2018   NA 139 12/06/2018   K 4.0 12/06/2018   CL 103 12/06/2018   CO2 28 12/06/2018   GLUCOSE 83 12/06/2018   BUN 11 12/06/2018   CREATININE 0.77 12/06/2018   BILITOT 0.6 12/06/2018   ALKPHOS 35 07/20/2017   AST 38 (H) 12/06/2018   ALT 25  12/06/2018   PROT 6.4 12/06/2018   ALBUMIN 4.3 07/20/2017   CALCIUM 9.3 12/06/2018   GFRAA 107 12/06/2018   QFTBGOLD NEGATIVE 11/04/2017   QFTBGOLDPLUS NEGATIVE 12/06/2018    Speciality Comments: No specialty comments available.  Procedures:  No procedures performed Allergies: Oseltamivir and Tamiflu [oseltamivir phosphate]     Assessment / Plan:     Visit Diagnoses: Psoriatic arthritis ([place]) -She has no synovitis or dactylitis on exam.  She has not had any recent psoriatic arthritis flares.  She has no joint pain or joint swelling at this time.  She has no Achilles tenderness or plantar fasciitis.  She has been having some increased lower back pain intermittently.  She has been holding Humira since February 2020  due to concern for COVID-19.  She is apprehensive to restart at this time.  She is clinically doing well off of Humira at this time.  She is advised to notify us if she develops increased joint pain or joint swelling.  We also discussed starting her on [place] which is effective for psoriatic arthritis and psoriasis but is less immunosuppressive than Humira.  She has given a handout of information about [place] to review.  She will notify us if she would like to switch to [place].  She will follow up in 6 months.   Psoriasis - She has psoriasis in bilateral ear canals and small patches on the extensor surfaces of both elbow joints.    High risk medication use -   She has been holding Humira since February 2020 due to her concern about COVID-19. Last TB gold negative on 12/06/2018 and will monitor yearly.  Most recent CBC/CMP within normal limits except for borderline elevated AST on 12/06/2018 due for CBC/CMP today and will monitor every 3 months.  Standing orders are in place.   CBC and CMP will be drawn today. - Plan: CBC with Differential/Platelet, COMPLETE METABOLIC PANEL WITH GFR,   Trochanteric bursitis of both hips - She has tenderness over bilateral trochanteric bursa on exam.  She  has been having increased discomfort recently.  She has been training for a marathon.  She was encouraged to perform stretching exercises.  She was given a handout of exercises.   Orders: Orders Placed This Encounter  Procedures  . CBC with Differential/Platelet  . COMPLETE METABOLIC PANEL WITH GFR   No orders of the defined types were placed in this encounter.     Follow-Up Instructions: Return in about 6 months (around 11/09/2019) for Psoriatic arthritis.   Ofilia Neas, PA-C   I examined and evaluated the patient with Hazel Sams PA.  Patient had no synovitis on examination today.  Although she is hesitant to go back on Humira.  She still have some active psoriasis.  We discussed different treatment options including [place].  A handout was given for her to review.  She will contact us once she reviews the information on [place].  The plan of care was discussed as noted above.  Bo Merino, MD  Note - This record has been created using Editor, commissioning.  Chart creation errors have been sought, but may not always  have been located. Such creation errors do not reflect on  the standard of medical care.

## 2019-05-10 ENCOUNTER — Other Ambulatory Visit: Payer: Self-pay

## 2019-05-10 ENCOUNTER — Ambulatory Visit (INDEPENDENT_AMBULATORY_CARE_PROVIDER_SITE_OTHER): Payer: BC Managed Care – PPO | Admitting: Physician Assistant

## 2019-05-10 ENCOUNTER — Encounter: Payer: Self-pay | Admitting: Physician Assistant

## 2019-05-10 VITALS — BP 125/82 | HR 72 | Resp 12 | Ht 67.5 in | Wt 150.0 lb

## 2019-05-10 DIAGNOSIS — Z79899 Other long term (current) drug therapy: Secondary | ICD-10-CM

## 2019-05-10 DIAGNOSIS — M7061 Trochanteric bursitis, right hip: Secondary | ICD-10-CM

## 2019-05-10 DIAGNOSIS — L4050 Arthropathic psoriasis, unspecified: Secondary | ICD-10-CM

## 2019-05-10 DIAGNOSIS — L409 Psoriasis, unspecified: Secondary | ICD-10-CM

## 2019-05-10 DIAGNOSIS — M7062 Trochanteric bursitis, left hip: Secondary | ICD-10-CM

## 2019-05-10 NOTE — Patient Instructions (Signed)

## 2019-05-11 LAB — COMPLETE METABOLIC PANEL WITH GFR
AG Ratio: 1.8 (calc) (ref 1.0–2.5)
ALT: 61 U/L — ABNORMAL HIGH (ref 6–29)
AST: 61 U/L — ABNORMAL HIGH (ref 10–35)
Albumin: 4.3 g/dL (ref 3.6–5.1)
Alkaline phosphatase (APISO): 46 U/L (ref 31–125)
BUN: 15 mg/dL (ref 7–25)
CO2: 29 mmol/L (ref 20–32)
Calcium: 9.1 mg/dL (ref 8.6–10.2)
Chloride: 101 mmol/L (ref 98–110)
Creat: 0.84 mg/dL (ref 0.50–1.10)
GFR, Est African American: 96 mL/min/{1.73_m2} (ref >=60)
GFR, Est Non African American: 83 mL/min/{1.73_m2} (ref >=60)
Globulin: 2.4 g/dL (calc) (ref 1.9–3.7)
Glucose, Bld: 94 mg/dL (ref 65–99)
Potassium: 4 mmol/L (ref 3.5–5.3)
Sodium: 137 mmol/L (ref 135–146)
Total Bilirubin: 0.6 mg/dL (ref 0.2–1.2)
Total Protein: 6.7 g/dL (ref 6.1–8.1)

## 2019-05-11 LAB — CBC WITH DIFFERENTIAL/PLATELET
Absolute Monocytes: 510 cells/uL (ref 200–950)
Basophils Absolute: 30 cells/uL (ref 0–200)
Basophils Relative: 0.6 %
Eosinophils Absolute: 40 cells/uL (ref 15–500)
Eosinophils Relative: 0.8 %
HCT: 40.4 % (ref 35.0–45.0)
Hemoglobin: 14 g/dL (ref 11.7–15.5)
Lymphs Abs: 1155 cells/uL (ref 850–3900)
MCH: 32.6 pg (ref 27.0–33.0)
MCHC: 34.7 g/dL (ref 32.0–36.0)
MCV: 94 fL (ref 80.0–100.0)
MPV: 10.6 fL (ref 7.5–12.5)
Monocytes Relative: 10.2 %
Neutro Abs: 3265 cells/uL (ref 1500–7800)
Neutrophils Relative %: 65.3 %
Platelets: 206 10*3/uL (ref 140–400)
RBC: 4.3 10*6/uL (ref 3.80–5.10)
RDW: 11.5 % (ref 11.0–15.0)
Total Lymphocyte: 23.1 %
WBC: 5 10*3/uL (ref 3.8–10.8)

## 2019-05-11 NOTE — Progress Notes (Signed)
CBC WNL.  LFTs are elevated.  Please advise patient to avoid alcohol, NSAIDs, and tylenol. Please forward labs to PCP.

## 2019-05-14 NOTE — Progress Notes (Signed)
Patient will have to stop drinking alcohol completely and repeat labs in 1 month prior to starting Humira.  If LFTs are still elevated she will need GI evaluation.

## 2019-06-05 DIAGNOSIS — L03019 Cellulitis of unspecified finger: Secondary | ICD-10-CM | POA: Diagnosis not present

## 2019-06-26 ENCOUNTER — Encounter: Payer: Self-pay | Admitting: Internal Medicine

## 2019-06-26 ENCOUNTER — Ambulatory Visit (INDEPENDENT_AMBULATORY_CARE_PROVIDER_SITE_OTHER): Payer: BC Managed Care – PPO | Admitting: Internal Medicine

## 2019-06-26 VITALS — Ht 67.5 in | Wt 145.0 lb

## 2019-06-26 DIAGNOSIS — R748 Abnormal levels of other serum enzymes: Secondary | ICD-10-CM

## 2019-06-26 DIAGNOSIS — L4050 Arthropathic psoriasis, unspecified: Secondary | ICD-10-CM

## 2019-06-26 DIAGNOSIS — Z8379 Family history of other diseases of the digestive system: Secondary | ICD-10-CM

## 2019-06-26 NOTE — Progress Notes (Signed)
Patient ID: PERCY COMP, female   DOB: March 16, 1971, 48 y.o.   MRN: [id number]   This service was provided via telemedicine.  Doximity application with A/V communication The patient was located at home The provider was located in provider's GI office. The patient did consent to this telephone visit and is aware of possible charges through their insurance for this visit.   The persons participating in this telemedicine service were the patient and I. Time spent on call: 25 min   HPI: Samantha Graves is a 48 year old female with PMH of psoriatic arthritis, ADHD who is seen to evaluate elevated liver enzymes.  She is seen over the Doximity app today during the COVID-19 pandemic.  She reports that in late 2019 on routine lab monitoring it was noticed that her AST and ALT were elevated.  This initially occurred during a period when she was training for the [employer].  She was training long distance running, there was question of whether the activity could have led to the elevated liver enzymes.  Humira, which she was taking for psoriatic arthritis was held for a couple of months to see if this was also contributing.  She then took 1 dose of Humira in February and developed a severe case of influenza.  Humira was stopped again and has remained off particularly in the COVID-19 pandemic.  Her liver enzymes have been checked subsequently and have remained elevated.  They were last checked in June 2020 and her AST and ALT were both 61.  She denies specific liver complaint.  Over the last 9 months she has noticed some increased GI distress for her.  This is an IBS type picture in her opinion with back and forth bowel movements varying from loose to constipated.  She is noticed some periodic low back pain is a dull aching across her lower back.  This can last 3 to 4 days at a time.  Some abdominal crampy discomfort related to bowel movement.  No right upper quadrant pain.  No jaundice.  No itching.  She  does drink alcohol on average 5-7 drinks per week but she has seriously restricted her alcohol intake over the last several months in light of her elevated liver enzymes.  She now is drinking rather rarely maybe 1 drink per week.  For her psoriatic arthritis she took Enbrel for some time and then Humira over the last 18 months but Humira has been off as stated above.  She sees Dr. Estanislado Pandy for her psoriatic arthritis.  No new medications.  She does occasionally use beet root capsule.  Family history notable for a mother and an aunt with nonalcoholic fatty liver disease.  Her mother has never had issues with being overweight or obese.  No high risk lifestyle but the patient does have a concern for viral hepatitis.  About 30 years ago when she was traveling in [place] she developed an infection in her bilateral axilla.  She was treated with injections of penicillin while there and she has always worried about the cleanliness of the needles.  Past Medical History:  Diagnosis Date  . Elevated LFTs   . Psoriatic arthritis [facility])     Past Surgical History:  Procedure Laterality Date  . INTRAUTERINE DEVICE INSERTION  05/30/15, 05/06/10   MIrena IUD  . TONSILLECTOMY AND ADENOIDECTOMY  1990  . WISDOM TOOTH EXTRACTION Bilateral 1991    Outpatient Medications Prior to Visit  Medication Sig Dispense Refill  . Cyanocobalamin (VITAMIN  B-12 PO) Take 1 tablet by mouth daily.     Marland Kitchen EVEKEO 10 MG TABS Take 1 tablet by mouth every morning.  0  . fish oil-omega-3 fatty acids 1000 MG capsule Take 2 g by mouth daily.    Marland Kitchen levonorgestrel (MIRENA) 20 MCG/24HR IUD 1 each by Intrauterine route once. Mirena IUD.    Marland Kitchen vitamin D, CHOLECALCIFEROL, 400 UNITS tablet Take 400 Units by mouth daily.      . Adalimumab (HUMIRA PEN) 40 MG/0.4ML PNKT Inject 40 mg into the skin every 14 (fourteen) days. 3 each 0  . TURMERIC PO Take by mouth.     No facility-administered medications prior to visit.     Allergies   Allergen Reactions  . Oseltamivir   . Tamiflu [Oseltamivir Phosphate]     Family History  Problem Relation Age of Onset  . Hyperlipidemia Mother   . Ovarian cysts Mother        benign  . Endometriosis Mother   . Liver disease Mother        NASH  . Other Father        auto imune  . Coronary artery disease Father        cardio ablation  . COPD Maternal Grandmother   . Bone cancer Maternal Grandfather 71  . Arthritis/Rheumatoid Sister 78  . Endometriosis Sister   . Retinitis pigmentosa Sister 79  . Ulcerative colitis Sister 21       surgical repair, J pouch  . Ovarian cancer Sister 33       negative genetic testing  . Liver disease Maternal Aunt        NASH  . Colon cancer Neg Hx   . Esophageal cancer Neg Hx   . Pancreatic cancer Neg Hx   . Stomach cancer Neg Hx     Social History   Tobacco Use  . Smoking status: Never Smoker  . Smokeless tobacco: Never Used  Substance Use Topics  . Alcohol use: Yes    Alcohol/week: 5.0 - 7.0 standard drinks    Types: 5 - 7 Glasses of wine per week    Comment: over the last 3 months has up to 3 glasses per week only as of 06/26/19  . Drug use: No    ROS: As per history of present illness, otherwise negative  Ht 5' 7.5" (1.715 m)   Wt 145 lb (65.8 kg)   BMI 22.38 kg/m  No physical exam, virtual visit  RELEVANT LABS AND IMAGING: CBC    Component Value Date/Time   WBC 5.0 05/10/2019 1345   RBC 4.30 05/10/2019 1345   HGB 14.0 05/10/2019 1345   HGB 13.2 04/02/2013 1416   HCT 40.4 05/10/2019 1345   PLT 206 05/10/2019 1345   MCV 94.0 05/10/2019 1345   MCH 32.6 05/10/2019 1345   MCHC 34.7 05/10/2019 1345   RDW 11.5 05/10/2019 1345   LYMPHSABS 1,155 05/10/2019 1345   MONOABS 416 07/20/2017 1035   EOSABS 40 05/10/2019 1345   BASOSABS 30 05/10/2019 1345    CMP     Component Value Date/Time   NA 137 05/10/2019 1345   K 4.0 05/10/2019 1345   CL 101 05/10/2019 1345   CO2 29 05/10/2019 1345   GLUCOSE 94 05/10/2019  1345   BUN 15 05/10/2019 1345   CREATININE 0.84 05/10/2019 1345   CALCIUM 9.1 05/10/2019 1345   PROT 6.7 05/10/2019 1345   ALBUMIN 4.3 07/20/2017 1035   AST 61 (H) 05/10/2019 1345  ALT 61 (H) 05/10/2019 1345   ALKPHOS 35 07/20/2017 1035   BILITOT 0.6 05/10/2019 1345   GFRNONAA 83 05/10/2019 1345   GFRAA 96 05/10/2019 1345    ASSESSMENT/PLAN: 48 year old female with PMH of psoriatic arthritis, ADHD who is seen to evaluate elevated liver enzymes.  1.  Elevated transaminases --we discussed her mild elevation in serum AST and ALT.  We discussed how we need to determine if this elevation is related to liver.  It is possible that these elevations could be related to muscle injury related to vigorous exercise but given the persistence this is less likely (she is no longer doing intensive training for marathon).  I think it is important to rule out viral hepatitis but also autoimmune causes for liver inflammation.  We did discuss how there can be an overlap with autoimmune conditions given that she has autoimmune psoriatic arthritis.  Family history also notable for steatosis and this needs to be excluded as well.  I have recommended that we do a laboratory evaluation and ultrasound initially as follows: --Complete abdominal ultrasound; elevated liver enzymes rule out steatosis --CBC, CMP, INR, IBC panel plus ferritin, celiac panel, hepatitis A antibody total, hepatitis B surface antigen, hepatitis B surface antibody, hepatitis B core total, hepatitis C antibody, ceruloplasmin, alpha-1 antitrypsin, ANA, IgG, anti-smooth muscle antibody, antimitochondrial antibody, GGT --Further recommendations after the above findings.  I do not think her liver enzyme elevation is related to Humira.  Thus if Dr. Estanislado Pandy wishes to resume this medication, I think this would be okay from liver standpoint    Cc: Dr. Estanislado Pandy

## 2019-06-26 NOTE — Addendum Note (Signed)
Addended by: Larina Bras on: 06/26/2019 05:09 PM   Modules accepted: Orders

## 2019-06-26 NOTE — Patient Instructions (Addendum)
Your provider has requested that you go to the basement level for lab work. Press "B" on the elevator. The lab is located at the first door on the left as you exit the elevator. (CBC, CMP, INR, IBC panel plus ferritin, celiac panel, hepatitis A antibody total, hepatitis B surface antigen, hepatitis B surface antibody, hepatitis B core total, hepatitis C antibody, ceruloplasmin, alpha-1 antitrypsin, ANA, IgG, anti-smooth muscle antibody, antimitochondrial antibody, GGT) __________________ You have been scheduled for an abdominal ultrasound at [facility] (1st floor of hospital) on Friday 07/06/19, at 8:00 am. Please arrive 15 minutes prior to your appointment for registration. Make certain not to have anything to eat or drink 6 hours prior to your appointment. Should you need to reschedule your appointment, please contact radiology at [phone]. This test typically takes about 30 minutes to perform. __________________ If you are age 37 or older, your body mass index should be between 23-30. Your Body mass index is 22.38 kg/m. If this is out of the aforementioned range  listed, please consider follow up with your Primary Care Provider.  If you are age 24 or younger, your body mass index should be between 19-25. Your Body mass index is 22.38 kg/m. If this is out of the aformentioned range listed, please consider follow up with your Primary Care Provider.

## 2019-06-29 ENCOUNTER — Other Ambulatory Visit (INDEPENDENT_AMBULATORY_CARE_PROVIDER_SITE_OTHER): Payer: BC Managed Care – PPO

## 2019-06-29 ENCOUNTER — Telehealth: Payer: Self-pay | Admitting: Internal Medicine

## 2019-06-29 DIAGNOSIS — Z79899 Other long term (current) drug therapy: Secondary | ICD-10-CM | POA: Diagnosis not present

## 2019-06-29 DIAGNOSIS — L4050 Arthropathic psoriasis, unspecified: Secondary | ICD-10-CM

## 2019-06-29 DIAGNOSIS — R748 Abnormal levels of other serum enzymes: Secondary | ICD-10-CM | POA: Diagnosis not present

## 2019-06-29 DIAGNOSIS — R109 Unspecified abdominal pain: Secondary | ICD-10-CM | POA: Diagnosis not present

## 2019-06-29 DIAGNOSIS — Z8379 Family history of other diseases of the digestive system: Secondary | ICD-10-CM

## 2019-06-29 LAB — COMPREHENSIVE METABOLIC PANEL
ALT: 394 U/L — ABNORMAL HIGH (ref 0–35)
AST: 239 U/L — ABNORMAL HIGH (ref 0–37)
Albumin: 4.4 g/dL (ref 3.5–5.2)
Alkaline Phosphatase: 71 U/L (ref 39–117)
BUN: 17 mg/dL (ref 6–23)
CO2: 29 mEq/L (ref 19–32)
Calcium: 9.5 mg/dL (ref 8.4–10.5)
Chloride: 105 mEq/L (ref 96–112)
Creatinine, Ser: 0.92 mg/dL (ref 0.40–1.20)
GFR: 65.25 mL/min (ref >60.00)
Glucose, Bld: 84 mg/dL (ref 70–99)
Potassium: 4 mEq/L (ref 3.5–5.1)
Sodium: 141 mEq/L (ref 135–145)
Total Bilirubin: 1 mg/dL (ref 0.2–1.2)
Total Protein: 7 g/dL (ref 6.0–8.3)

## 2019-06-29 LAB — CBC WITH DIFFERENTIAL/PLATELET
Basophils Absolute: 0 10*3/uL (ref 0.0–0.1)
Basophils Relative: 1.1 % (ref 0.0–3.0)
Eosinophils Absolute: 0.1 10*3/uL (ref 0.0–0.7)
Eosinophils Relative: 1.4 % (ref 0.0–5.0)
HCT: 40.8 % (ref 36.0–46.0)
Hemoglobin: 13.9 g/dL (ref 12.0–15.0)
Lymphocytes Relative: 27.5 % (ref 12.0–46.0)
Lymphs Abs: 1.2 10*3/uL (ref 0.7–4.0)
MCHC: 34.1 g/dL (ref 30.0–36.0)
MCV: 93.3 fl (ref 78.0–100.0)
Monocytes Absolute: 0.4 10*3/uL (ref 0.1–1.0)
Monocytes Relative: 9.8 % (ref 3.0–12.0)
Neutro Abs: 2.6 10*3/uL (ref 1.4–7.7)
Neutrophils Relative %: 60.2 % (ref 43.0–77.0)
Platelets: 181 10*3/uL (ref 150.0–400.0)
RBC: 4.37 Mil/uL (ref 3.87–5.11)
RDW: 12.6 % (ref 11.5–15.5)
WBC: 4.4 10*3/uL (ref 4.0–10.5)

## 2019-06-29 LAB — IBC + FERRITIN
Ferritin: 161.4 ng/mL (ref 10.0–291.0)
Iron: 187 ug/dL — ABNORMAL HIGH (ref 42–145)
Saturation Ratios: 53 % — ABNORMAL HIGH (ref 20.0–50.0)
Transferrin: 252 mg/dL (ref 212.0–360.0)

## 2019-06-29 LAB — GAMMA GT: GGT: 80 U/L — ABNORMAL HIGH (ref 7–51)

## 2019-06-29 LAB — IGA: IgA: 157 mg/dL (ref 68–378)

## 2019-06-29 LAB — PROTIME-INR
INR: 1.1 ratio — ABNORMAL HIGH (ref 0.8–1.0)
Prothrombin Time: 12.5 s (ref 9.6–13.1)

## 2019-06-29 NOTE — Telephone Encounter (Signed)
Ok to add COVID19 ab test -- I do not know how her insurance will handle or if they will cover this lab

## 2019-06-29 NOTE — Telephone Encounter (Signed)
Pt stated that she had blood works done this morning as per Dr. Vena Rua request and that antibody test for COVID-19 was also drawn.  Pt would like orders for COVID-19 to be placed.

## 2019-06-29 NOTE — Telephone Encounter (Signed)
Add-on sheet for test faxed to the lab.

## 2019-06-29 NOTE — Telephone Encounter (Signed)
Left message for pt to call back  °

## 2019-06-29 NOTE — Telephone Encounter (Signed)
Pt wants to know if Dr. Hilarie Fredrickson will add the covid antibody test to the labs she had drawn today. Please advise.

## 2019-07-03 LAB — ANTI-NUCLEAR AB-TITER (ANA TITER)
ANA TITER: 1:80 {titer} — ABNORMAL HIGH
ANA Titer 1: 1:80 {titer} — ABNORMAL HIGH

## 2019-07-03 LAB — IGG: IgG (Immunoglobin G), Serum: 1082 mg/dL (ref 600–1640)

## 2019-07-03 LAB — ALPHA-1-ANTITRYPSIN: A-1 Antitrypsin, Ser: 141 mg/dL (ref 83–199)

## 2019-07-03 LAB — HEPATITIS B SURFACE ANTIBODY,QUALITATIVE: Hep B S Ab: NONREACTIVE

## 2019-07-03 LAB — HEPATITIS B SURFACE ANTIGEN: Hepatitis B Surface Ag: NONREACTIVE

## 2019-07-03 LAB — MITOCHONDRIAL ANTIBODIES: Mitochondrial M2 Ab, IgG: <=20 U

## 2019-07-03 LAB — ANTI-SMOOTH MUSCLE ANTIBODY, IGG: Actin (Smooth Muscle) Antibody (IGG): <20 U (ref <20)

## 2019-07-03 LAB — HEPATITIS A ANTIBODY, TOTAL: Hepatitis A AB,Total: NONREACTIVE

## 2019-07-03 LAB — ANA: Anti Nuclear Antibody (ANA): POSITIVE — AB

## 2019-07-03 LAB — HEPATITIS B CORE ANTIBODY, TOTAL: Hep B Core Total Ab: NONREACTIVE

## 2019-07-03 LAB — HEPATITIS C ANTIBODY
Hepatitis C Ab: NONREACTIVE
SIGNAL TO CUT-OFF: 0.01 (ref <1.00)

## 2019-07-03 LAB — TISSUE TRANSGLUTAMINASE, IGA: (tTG) Ab, IgA: 1 U/mL

## 2019-07-03 LAB — CERULOPLASMIN: Ceruloplasmin: 30 mg/dL (ref 18–53)

## 2019-07-04 ENCOUNTER — Telehealth: Payer: Self-pay | Admitting: Internal Medicine

## 2019-07-04 ENCOUNTER — Other Ambulatory Visit: Payer: Self-pay

## 2019-07-04 DIAGNOSIS — R748 Abnormal levels of other serum enzymes: Secondary | ICD-10-CM

## 2019-07-04 NOTE — Telephone Encounter (Signed)
Pt returned your call regarding lab results.  °

## 2019-07-04 NOTE — Telephone Encounter (Signed)
See result note from recent labs for my conversation with the patient

## 2019-07-04 NOTE — Telephone Encounter (Signed)
See result note.  

## 2019-07-04 NOTE — Telephone Encounter (Signed)
Pt stated that Dr. Hilarie Fredrickson called her regarding test results.

## 2019-07-04 NOTE — Telephone Encounter (Signed)
See note below

## 2019-07-06 ENCOUNTER — Ambulatory Visit (HOSPITAL_COMMUNITY)
Admission: RE | Admit: 2019-07-06 | Discharge: 2019-07-06 | Disposition: A | Discharge status: Home / Self Care | Payer: BC Managed Care – PPO | Source: Ambulatory Visit | Attending: Internal Medicine | Admitting: Internal Medicine

## 2019-07-06 ENCOUNTER — Other Ambulatory Visit: Payer: Self-pay

## 2019-07-06 DIAGNOSIS — L4050 Arthropathic psoriasis, unspecified: Secondary | ICD-10-CM

## 2019-07-06 DIAGNOSIS — R748 Abnormal levels of other serum enzymes: Secondary | ICD-10-CM | POA: Diagnosis not present

## 2019-07-06 DIAGNOSIS — Z8379 Family history of other diseases of the digestive system: Secondary | ICD-10-CM

## 2019-07-06 DIAGNOSIS — R945 Abnormal results of liver function studies: Secondary | ICD-10-CM | POA: Diagnosis not present

## 2019-07-10 ENCOUNTER — Other Ambulatory Visit: Payer: Self-pay

## 2019-07-10 DIAGNOSIS — R1084 Generalized abdominal pain: Secondary | ICD-10-CM

## 2019-07-11 ENCOUNTER — Other Ambulatory Visit: Payer: BC Managed Care – PPO

## 2019-07-11 DIAGNOSIS — R748 Abnormal levels of other serum enzymes: Secondary | ICD-10-CM

## 2019-07-11 DIAGNOSIS — R1084 Generalized abdominal pain: Secondary | ICD-10-CM | POA: Diagnosis not present

## 2019-07-13 ENCOUNTER — Other Ambulatory Visit: Payer: Self-pay | Admitting: Radiology

## 2019-07-16 ENCOUNTER — Ambulatory Visit (HOSPITAL_COMMUNITY)
Admission: RE | Admit: 2019-07-16 | Discharge: 2019-07-16 | Disposition: A | Discharge status: Home / Self Care | Payer: BC Managed Care – PPO | Source: Ambulatory Visit | Attending: Internal Medicine | Admitting: Internal Medicine

## 2019-07-16 ENCOUNTER — Other Ambulatory Visit: Payer: Self-pay

## 2019-07-16 ENCOUNTER — Encounter (HOSPITAL_COMMUNITY): Payer: Self-pay

## 2019-07-16 DIAGNOSIS — R748 Abnormal levels of other serum enzymes: Secondary | ICD-10-CM | POA: Diagnosis not present

## 2019-07-16 DIAGNOSIS — Z8041 Family history of malignant neoplasm of ovary: Secondary | ICD-10-CM | POA: Diagnosis not present

## 2019-07-16 DIAGNOSIS — Z79899 Other long term (current) drug therapy: Secondary | ICD-10-CM | POA: Diagnosis not present

## 2019-07-16 DIAGNOSIS — B179 Acute viral hepatitis, unspecified: Secondary | ICD-10-CM | POA: Diagnosis not present

## 2019-07-16 DIAGNOSIS — Z808 Family history of malignant neoplasm of other organs or systems: Secondary | ICD-10-CM | POA: Diagnosis not present

## 2019-07-16 DIAGNOSIS — Z8349 Family history of other endocrine, nutritional and metabolic diseases: Secondary | ICD-10-CM | POA: Insufficient documentation

## 2019-07-16 DIAGNOSIS — R7989 Other specified abnormal findings of blood chemistry: Secondary | ICD-10-CM | POA: Diagnosis not present

## 2019-07-16 DIAGNOSIS — R945 Abnormal results of liver function studies: Secondary | ICD-10-CM | POA: Diagnosis not present

## 2019-07-16 DIAGNOSIS — K2951 Unspecified chronic gastritis with bleeding: Secondary | ICD-10-CM | POA: Diagnosis not present

## 2019-07-16 LAB — PROTIME-INR
INR: 1 (ref 0.8–1.2)
Prothrombin Time: 13.5 seconds (ref 11.4–15.2)

## 2019-07-16 LAB — CBC WITH DIFFERENTIAL/PLATELET
Abs Immature Granulocytes: 0.02 10*3/uL (ref 0.00–0.07)
Basophils Absolute: 0 10*3/uL (ref 0.0–0.1)
Basophils Relative: 1 %
Eosinophils Absolute: 0.1 10*3/uL (ref 0.0–0.5)
Eosinophils Relative: 1 %
HCT: 43.1 % (ref 36.0–46.0)
Hemoglobin: 14.4 g/dL (ref 12.0–15.0)
Immature Granulocytes: 0 %
Lymphocytes Relative: 26 %
Lymphs Abs: 1.5 10*3/uL (ref 0.7–4.0)
MCH: 31.8 pg (ref 26.0–34.0)
MCHC: 33.4 g/dL (ref 30.0–36.0)
MCV: 95.1 fL (ref 80.0–100.0)
Monocytes Absolute: 0.5 10*3/uL (ref 0.1–1.0)
Monocytes Relative: 10 %
Neutro Abs: 3.5 10*3/uL (ref 1.7–7.7)
Neutrophils Relative %: 62 %
Platelets: 174 10*3/uL (ref 150–400)
RBC: 4.53 MIL/uL (ref 3.87–5.11)
RDW: 12.2 % (ref 11.5–15.5)
WBC: 5.6 10*3/uL (ref 4.0–10.5)
nRBC: 0 % (ref 0.0–0.2)

## 2019-07-16 LAB — COMPREHENSIVE METABOLIC PANEL
ALT: 146 U/L — ABNORMAL HIGH (ref 0–44)
AST: 98 U/L — ABNORMAL HIGH (ref 15–41)
Albumin: 4.1 g/dL (ref 3.5–5.0)
Alkaline Phosphatase: 54 U/L (ref 38–126)
Anion gap: 9 (ref 5–15)
BUN: 16 mg/dL (ref 6–20)
CO2: 26 mmol/L (ref 22–32)
Calcium: 9.5 mg/dL (ref 8.9–10.3)
Chloride: 102 mmol/L (ref 98–111)
Creatinine, Ser: 0.86 mg/dL (ref 0.44–1.00)
GFR calc Af Amer: >60 mL/min (ref >60)
GFR calc non Af Amer: >60 mL/min (ref >60)
Glucose, Bld: 88 mg/dL (ref 70–99)
Potassium: 4 mmol/L (ref 3.5–5.1)
Sodium: 137 mmol/L (ref 135–145)
Total Bilirubin: 0.9 mg/dL (ref 0.3–1.2)
Total Protein: 7 g/dL (ref 6.5–8.1)

## 2019-07-16 MED ORDER — FENTANYL CITRATE (PF) 100 MCG/2ML IJ SOLN
INTRAMUSCULAR | Status: AC | PRN
  Administered 2019-07-16 (×2): 50 ug via INTRAVENOUS

## 2019-07-16 MED ORDER — MIDAZOLAM HCL 2 MG/2ML IJ SOLN
INTRAMUSCULAR | Status: AC | PRN
  Administered 2019-07-16: 1 mg via INTRAVENOUS

## 2019-07-16 MED ORDER — GELATIN ABSORBABLE 12-7 MM EX MISC
CUTANEOUS | Status: AC
  Filled 2019-07-16: qty 1

## 2019-07-16 MED ORDER — LIDOCAINE-EPINEPHRINE (PF) 2 %-1:200000 IJ SOLN
INTRAMUSCULAR | Status: AC
  Filled 2019-07-16: qty 20

## 2019-07-16 MED ORDER — SODIUM CHLORIDE 0.9 % IV SOLN
INTRAVENOUS | Status: DC
  Administered 2019-07-16: 13:00:00 via INTRAVENOUS

## 2019-07-16 MED ORDER — FENTANYL CITRATE (PF) 100 MCG/2ML IJ SOLN
INTRAMUSCULAR | Status: AC
  Filled 2019-07-16: qty 2

## 2019-07-16 MED ORDER — MIDAZOLAM HCL 2 MG/2ML IJ SOLN
INTRAMUSCULAR | Status: AC
  Filled 2019-07-16: qty 4

## 2019-07-16 NOTE — Procedures (Signed)
Pre Procedure Dx: Elevated LFTs Post Procedural Dx: Same  Technically successful US guided biopsy of right lobe of the liver.  EBL: None  No immediate complications.   Jay Mykah Bellomo, MD Pager #: [phone]    

## 2019-07-16 NOTE — H&P (Signed)
Chief Complaint: Patient was seen in consultation today for elevated LFTs  Referring Physician(s): Pyrtle,Jay M  Supervising Physician: Sandi Mariscal  Patient Status: [place]  History of Present Illness: Samantha Graves is a 48 y.o. female with past medical history of psoriatic arthritis and elevated LFTs found on routine physical in 2019.  Patient recently evaluated by GI and is currently undergoing further work-up for the cause of her abnormal lab work.  IR consulted for random liver biopsy at the request of Dr. Hilarie Fredrickson.   Patient presents to [facility] today in her usual state of health.  Denies concerns or new complaints.  She has been NPO.  She does not take blood thinners.   Past Medical History:  Diagnosis Date  . Elevated LFTs   . Psoriatic arthritis [facility])     Past Surgical History:  Procedure Laterality Date  . INTRAUTERINE DEVICE INSERTION  05/30/15, 05/06/10   MIrena IUD  . TONSILLECTOMY AND ADENOIDECTOMY  1990  . WISDOM TOOTH EXTRACTION Bilateral 1991    Allergies: Oseltamivir and Tamiflu [oseltamivir phosphate]  Medications: Prior to Admission medications   Medication Sig Start Date End Date Taking? Authorizing Provider  Cyanocobalamin (VITAMIN B-12 PO) Take 1 tablet by mouth daily.    Yes [provider]  EVEKEO 10 MG TABS Take 1 tablet by mouth every morning. 01/01/17  Yes [provider]  fish oil-omega-3 fatty acids 1000 MG capsule Take 2 g by mouth daily.   Yes [provider]  levonorgestrel (MIRENA) 20 MCG/24HR IUD 1 each by Intrauterine route once. Mirena IUD. 05/30/15  Yes [provider]  vitamin D, CHOLECALCIFEROL, 400 UNITS tablet Take 400 Units by mouth daily.     Yes [provider]     Family History  Problem Relation Age of Onset  . Hyperlipidemia Mother   . Ovarian cysts Mother        benign  . Endometriosis Mother   . Liver disease Mother        NASH  . Other Father        auto imune   . Coronary artery disease Father        cardio ablation  . COPD Maternal Grandmother   . Bone cancer Maternal Grandfather 56  . Arthritis/Rheumatoid Sister 28  . Endometriosis Sister   . Retinitis pigmentosa Sister 50  . Ulcerative colitis Sister 21       surgical repair, J pouch  . Ovarian cancer Sister 89       negative genetic testing  . Liver disease Maternal Aunt        NASH  . Colon cancer Neg Hx   . Esophageal cancer Neg Hx   . Pancreatic cancer Neg Hx   . Stomach cancer Neg Hx     Social History   Socioeconomic History  . Marital status: Married    Spouse name: Not on file  . Number of children: Not on file  . Years of education: Not on file  . Highest education level: Not on file  Occupational History  . Not on file  Social Needs  . Financial resource strain: Not on file  . Food insecurity    Worry: Not on file    Inability: Not on file  . Transportation needs    Medical: Not on file    Non-medical: Not on file  Tobacco Use  . Smoking status: Never Smoker  . Smokeless tobacco: Never Used  Substance and Sexual Activity  .  Alcohol use: Yes    Alcohol/week: 5.0 - 7.0 standard drinks    Types: 5 - 7 Glasses of wine per week    Comment: over the last 3 months has up to 3 glasses per week only as of 06/26/19  . Drug use: No  . Sexual activity: Yes    Birth control/protection: I.U.D.  Lifestyle  . Physical activity    Days per week: Not on file    Minutes per session: Not on file  . Stress: Not on file  Relationships  . Social Herbalist on phone: Not on file    Gets together: Not on file    Attends religious service: Not on file    Active member of club or organization: Not on file    Attends meetings of clubs or organizations: Not on file    Relationship status: Not on file  Other Topics Concern  . Not on file  Social History Narrative  . Not on file     Review of Systems: A 12 point ROS discussed and pertinent positives are indicated  in the HPI above.  All other systems are negative.  Review of Systems  Constitutional: Negative for fatigue and fever.  Respiratory: Negative for cough and shortness of breath.   Gastrointestinal: Negative for abdominal pain, diarrhea, nausea and vomiting.  Musculoskeletal: Negative for back pain.  Psychiatric/Behavioral: Negative for behavioral problems and confusion.    Vital Signs: BP 116/76   Pulse 66   Temp 98.6 F (37 C) (Oral)   Resp 12   Ht 5' 7.5" (1.715 m)   Wt 145 lb (65.8 kg)   SpO2 100%   BMI 22.38 kg/m   Physical Exam Vitals signs and nursing note reviewed.  Constitutional:      Appearance: Normal appearance.  HENT:     Mouth/Throat:     Mouth: Mucous membranes are moist.     Pharynx: Oropharynx is clear.  Cardiovascular:     Rate and Rhythm: Normal rate and regular rhythm.     Pulses: Normal pulses.     Heart sounds: No murmur. No friction rub. No gallop.   Pulmonary:     Effort: Pulmonary effort is normal. No respiratory distress.     Breath sounds: Normal breath sounds.  Abdominal:     General: Abdomen is flat. There is no distension.     Palpations: Abdomen is soft.  Skin:    General: Skin is warm and dry.  Neurological:     General: No focal deficit present.     Mental Status: She is alert and oriented to person, place, and time. Mental status is at baseline.  Psychiatric:        Mood and Affect: Mood normal.        Behavior: Behavior normal.        Thought Content: Thought content normal.        Judgment: Judgment normal.      MD Evaluation Airway: WNL Heart: WNL Abdomen: WNL Chest/ Lungs: WNL ASA  Classification: 3 Mallampati/Airway Score: One   Imaging: US Abdomen Complete  Result Date: 07/06/2019 CLINICAL DATA:  Elevated liver enzymes EXAM: ABDOMEN ULTRASOUND COMPLETE COMPARISON:  None. FINDINGS: Gallbladder: No gallstones or wall thickening visualized. There is no pericholecystic fluid. No sonographic Murphy sign noted by  sonographer. Common bile duct: Diameter: 3 mm. No intrahepatic, common hepatic, or common bile duct dilatation. Liver: No focal lesion identified. Within normal limits in parenchymal echogenicity. Portal vein is  patent on color Doppler imaging with normal direction of blood flow towards the liver. IVC: No abnormality visualized. Pancreas: No pancreatic mass or inflammatory focus. Spleen: Size and appearance within normal limits. Right Kidney: Length: 10.2 cm. Echogenicity within normal limits. No mass or hydronephrosis visualized. Left Kidney: Length: 10.5 cm. Echogenicity within normal limits. No mass or hydronephrosis visualized. Abdominal aorta: No aneurysm visualized. Other findings: No demonstrable ascites. IMPRESSION: Study within normal limits. Electronically Signed   By: Lowella Grip III M.D.   On: 07/06/2019 10:35    Labs:  CBC: Recent Labs    12/06/18 1406 05/10/19 1345 06/29/19 1033 07/16/19 1155  WBC 6.8 5.0 4.4 5.6  HGB 14.2 14.0 13.9 14.4  HCT 41.0 40.4 40.8 43.1  PLT 209 206 181.0 174    COAGS: Recent Labs    06/29/19 1033  INR 1.1*    BMP: Recent Labs    12/06/18 1406 05/10/19 1345 06/29/19 1033  NA 139 137 141  K 4.0 4.0 4.0  CL 103 101 105  CO2 28 29 29   GLUCOSE 83 94 84  BUN 11 15 17   CALCIUM 9.3 9.1 9.5  CREATININE 0.77 0.84 0.92  GFRNONAA 92 83  --   GFRAA 107 96  --     LIVER FUNCTION TESTS: Recent Labs    12/06/18 1406 05/10/19 1345 06/29/19 1033  BILITOT 0.6 0.6 1.0  AST 38* 61* 239*  ALT 25 61* 394*  ALKPHOS  --   --  71  PROT 6.4 6.7 7.0  ALBUMIN  --   --  4.4    TUMOR MARKERS: No results for input(s): AFPTM, CEA, CA199, CHROMGRNA in the last 8760 hours.  Assessment and Plan: Patient with past medical history of psoriatic arthritis presents with complaint of elevated LFTs.  IR consulted for random liver biopsy at the request of Dr. Hilarie Fredrickson. Case reviewed by Dr. Pascal Lux who approves patient for procedure.  Patient presents  today in their usual state of health.  She has been NPO and is not currently on blood thinners.    Risks and benefits of biopsy was discussed with the patient and/or patient's family including, but not limited to bleeding, infection, damage to adjacent structures or low yield requiring additional tests.  All of the questions were answered and there is agreement to proceed.  Consent signed and in chart.   Thank you for this interesting consult.  I greatly enjoyed meeting LESIELI BRESEE and look forward to participating in their care.  A copy of this report was sent to the requesting provider on this date.  Electronically Signed: Docia Barrier, PA 07/16/2019, 12:54 PM   I spent a total of  30 Minutes   in face to face in clinical consultation, greater than 50% of which was counseling/coordinating care for elevated LFTs.

## 2019-07-16 NOTE — Discharge Instructions (Signed)
Liver Biopsy, Care After °These instructions give you information about how to care for yourself after your procedure. Your health care provider may also give you more specific instructions. If you have problems or questions, contact your health care provider. °What can I expect after the procedure? °After your procedure, it is common to have: °· Pain and soreness in the area where the biopsy was done. °· Bruising around the area where the biopsy was done. °· Sleepiness and fatigue for 1-2 days. °Follow these instructions at home: °Medicines °· Take over-the-counter and prescription medicines only as told by your health care provider. °· If you were prescribed an antibiotic medicine, take it as told by your health care provider. Do not stop taking the antibiotic even if you start to feel better. °· Do not take medicines such as aspirin and ibuprofen unless your health care provider tells you to take them. These medicines thin your blood and can increase the risk of bleeding. °· If you are taking prescription pain medicine, take actions to prevent or treat constipation. Your health care provider may recommend that you: °? Drink enough fluid to keep your urine pale yellow. °? Eat foods that are high in fiber, such as fresh fruits and vegetables, whole grains, and beans. °? Limit foods that are high in fat and processed sugars, such as fried or sweet foods. °? Take an over-the-counter or prescription medicine for constipation. °Incision care °· Follow instructions from your health care provider about how to take care of your incision. Make sure you: °? Wash your hands with soap and water before you change your bandage (dressing). If soap and water are not available, use hand sanitizer. °? Change your dressing as told by your health care provider. °? Leave stitches (sutures), skin glue, or adhesive strips in place. These skin closures may need to stay in place for 2 weeks or longer. If adhesive strip edges start to  loosen and curl up, you may trim the loose edges. Do not remove adhesive strips completely unless your health care provider tells you to do that. °· Check your incision area every day for signs of infection. Check for: °? Redness, swelling, or pain. °? Fluid or blood. °? Warmth. °? Pus or a bad smell. °· Do not take baths, swim, or use a hot tub until your health care provider says it is okay to do so. °Activity ° °· Rest at home for 1-2 days, or as directed by your health care provider. °? Avoid sitting for a long time without moving. Get up to take short walks every 1-2 hours. This is important to improve blood flow and breathing. Ask for help if you feel weak or unsteady. °· Return to your normal activities as told by your health care provider. Ask your health care provider what activities are safe for you. °· Do not drive or use heavy machinery while taking prescription pain medicine. °· Do not lift anything that is heavier than 10 lb (4.5 kg), or the limit that your health care provider tells you, until he or she says that it is safe. °· Do not play contact sports for 2 weeks after the procedure. °General instructions ° °· Do not drink alcohol in the first week after the procedure. °· Have someone stay with you for at least 24 hours after the procedure. °· It is your responsibility to obtain your test results. Ask your health care provider, or the department that is doing the test: °? When will my   results be ready? °? How will I get my results? °? What are my treatment options? °? What other tests do I need? °? What are my next steps? °· Keep all follow-up visits as told by your health care provider. This is important. °Contact a health care provider if: °· You have increased bleeding from an incision, resulting in more than a small spot of blood. °· You have redness, swelling, or increasing pain in any incisions. °· You notice a discharge or a bad smell coming from any of your incisions. °· You have a fever or  chills. °Get help right away if: °· You develop swelling, bloating, or pain in your abdomen. °· You become dizzy or faint. °· You develop a rash. °· You have nausea or you vomit. °· You faint, or you have shortness of breath or difficulty breathing. °· You develop chest pain. °· You have problems with your speech or vision. °· You have trouble with your balance or moving your arms or legs. °Summary °· After the liver biopsy, it is common to have pain, soreness, and bruising in the area, as well as sleepiness and fatigue. °· Take over-the-counter and prescription medicines only as told by your health care provider. °· Follow instructions from your health care provider about how to care for your incision. Check the incision area daily for signs of infection. °This information is not intended to replace advice given to you by your health care provider. Make sure you discuss any questions you have with your health care provider. °Document Released: 06/04/2005 Document Revised: 01/08/2019 Document Reviewed: 11/25/2017 °Elsevier Patient Education © 2020 [employer] °Moderate Conscious Sedation, Adult, Care After °These instructions provide you with information about caring for yourself after your procedure. Your health care provider may also give you more specific instructions. Your treatment has been planned according to current medical practices, but problems sometimes occur. Call your health care provider if you have any problems or questions after your procedure. °What can I expect after the procedure? °After your procedure, it is common: °· To feel sleepy for several hours. °· To feel clumsy and have poor balance for several hours. °· To have poor judgment for several hours. °· To vomit if you eat too soon. °Follow these instructions at home: °For at least 24 hours after the procedure: ° °· Do not: °? Participate in activities where you could fall or become injured. °? Drive. °? Use heavy machinery. °? Drink  alcohol. °? Take sleeping pills or medicines that cause drowsiness. °? Make important decisions or sign legal documents. °? Take care of children on your own. °· Rest. °Eating and drinking °· Follow the diet recommended by your health care provider. °· If you vomit: °? Drink water, juice, or soup when you can drink without vomiting. °? Make sure you have little or no nausea before eating solid foods. °General instructions °· Have a responsible adult stay with you until you are awake and alert. °· Take over-the-counter and prescription medicines only as told by your health care provider. °· If you smoke, do not smoke without supervision. °· Keep all follow-up visits as told by your health care provider. This is important. °Contact a health care provider if: °· You keep feeling nauseous or you keep vomiting. °· You feel light-headed. °· You develop a rash. °· You have a fever. °Get help right away if: °· You have trouble breathing. °This information is not intended to replace advice given to you by your health   care provider. Make sure you discuss any questions you have with your health care provider. °Document Released: 09/05/2013 Document Revised: 10/28/2017 Document Reviewed: 03/06/2016 °Elsevier Patient Education © 2020 [employer] ° °

## 2019-07-18 LAB — HEMOCHROMATOSIS DNA-PCR(C282Y,H63D)

## 2019-07-20 ENCOUNTER — Other Ambulatory Visit: Payer: Self-pay

## 2019-07-20 DIAGNOSIS — R748 Abnormal levels of other serum enzymes: Secondary | ICD-10-CM

## 2019-07-20 LAB — CELIAC DISEASE HLA DQ ASSOC.
DQ2 (DQA1 0501/0505,DQB1 02XX): NEGATIVE
DQ8 (DQA1 03XX, DQB1 0302): NEGATIVE

## 2019-07-25 ENCOUNTER — Encounter (HOSPITAL_COMMUNITY): Payer: Self-pay | Admitting: Emergency Medicine

## 2019-07-25 ENCOUNTER — Other Ambulatory Visit: Payer: Self-pay

## 2019-07-25 ENCOUNTER — Emergency Department (HOSPITAL_COMMUNITY)
Admission: EM | Admit: 2019-07-25 | Discharge: 2019-07-26 | Disposition: A | Discharge status: Home / Self Care | Payer: BC Managed Care – PPO | Attending: Emergency Medicine | Admitting: Emergency Medicine

## 2019-07-25 DIAGNOSIS — Z975 Presence of (intrauterine) contraceptive device: Secondary | ICD-10-CM | POA: Insufficient documentation

## 2019-07-25 DIAGNOSIS — R1084 Generalized abdominal pain: Secondary | ICD-10-CM | POA: Diagnosis not present

## 2019-07-25 DIAGNOSIS — K5900 Constipation, unspecified: Secondary | ICD-10-CM | POA: Diagnosis not present

## 2019-07-25 DIAGNOSIS — Z79899 Other long term (current) drug therapy: Secondary | ICD-10-CM | POA: Diagnosis not present

## 2019-07-25 DIAGNOSIS — R1011 Right upper quadrant pain: Secondary | ICD-10-CM | POA: Diagnosis not present

## 2019-07-25 LAB — URINALYSIS, ROUTINE W REFLEX MICROSCOPIC
Bilirubin Urine: NEGATIVE
Glucose, UA: NEGATIVE mg/dL
Hgb urine dipstick: NEGATIVE
Ketones, ur: NEGATIVE mg/dL
Leukocytes,Ua: NEGATIVE
Nitrite: NEGATIVE
Protein, ur: NEGATIVE mg/dL
Specific Gravity, Urine: 1.015 (ref 1.005–1.030)
pH: 7 (ref 5.0–8.0)

## 2019-07-25 LAB — CBC
HCT: 42.1 % (ref 36.0–46.0)
Hemoglobin: 13.9 g/dL (ref 12.0–15.0)
MCH: 31.6 pg (ref 26.0–34.0)
MCHC: 33 g/dL (ref 30.0–36.0)
MCV: 95.7 fL (ref 80.0–100.0)
Platelets: 159 10*3/uL (ref 150–400)
RBC: 4.4 MIL/uL (ref 3.87–5.11)
RDW: 11.9 % (ref 11.5–15.5)
WBC: 10.1 10*3/uL (ref 4.0–10.5)
nRBC: 0 % (ref 0.0–0.2)

## 2019-07-25 LAB — COMPREHENSIVE METABOLIC PANEL
ALT: 107 U/L — ABNORMAL HIGH (ref 0–44)
AST: 74 U/L — ABNORMAL HIGH (ref 15–41)
Albumin: 4.2 g/dL (ref 3.5–5.0)
Alkaline Phosphatase: 52 U/L (ref 38–126)
Anion gap: 7 (ref 5–15)
BUN: 17 mg/dL (ref 6–20)
CO2: 29 mmol/L (ref 22–32)
Calcium: 9.2 mg/dL (ref 8.9–10.3)
Chloride: 103 mmol/L (ref 98–111)
Creatinine, Ser: 0.9 mg/dL (ref 0.44–1.00)
GFR calc Af Amer: >60 mL/min (ref >60)
GFR calc non Af Amer: >60 mL/min (ref >60)
Glucose, Bld: 80 mg/dL (ref 70–99)
Potassium: 3.6 mmol/L (ref 3.5–5.1)
Sodium: 139 mmol/L (ref 135–145)
Total Bilirubin: 0.6 mg/dL (ref 0.3–1.2)
Total Protein: 6.7 g/dL (ref 6.5–8.1)

## 2019-07-25 LAB — I-STAT BETA HCG BLOOD, ED (MC, WL, AP ONLY): I-stat hCG, quantitative: <5 m[IU]/mL (ref <5)

## 2019-07-25 LAB — LIPASE, BLOOD: Lipase: 36 U/L (ref 11–51)

## 2019-07-25 MED ORDER — SODIUM CHLORIDE 0.9% FLUSH: 3.0000 mL | Freq: Once | INTRAVENOUS | Status: DC

## 2019-07-25 NOTE — ED Triage Notes (Signed)
Patient is complaining of right upper quadrant pain. Patient states it started 3 hours ago. Patient states it feels like a burning stitch. Patient does not have any other complaints.

## 2019-07-26 ENCOUNTER — Emergency Department (HOSPITAL_COMMUNITY): Payer: BC Managed Care – PPO

## 2019-07-26 DIAGNOSIS — R1011 Right upper quadrant pain: Secondary | ICD-10-CM | POA: Diagnosis not present

## 2019-07-26 MED ORDER — IOHEXOL 300 MG/ML  SOLN
100.0000 mL | Freq: Once | INTRAMUSCULAR | Status: AC | PRN
  Administered 2019-07-26: 03:00:00 100 mL via INTRAVENOUS

## 2019-07-26 MED ORDER — FENTANYL CITRATE (PF) 100 MCG/2ML IJ SOLN
INTRAMUSCULAR | Status: AC
  Administered 2019-07-26: 03:00:00
  Filled 2019-07-26: qty 2

## 2019-07-26 MED ORDER — SODIUM CHLORIDE 0.9 % IV BOLUS
500.0000 mL | Freq: Once | INTRAVENOUS | Status: AC
  Administered 2019-07-26: 01:00:00 500 mL via INTRAVENOUS

## 2019-07-26 NOTE — ED Provider Notes (Signed)
[facility] DEPT Provider Note   CSN: [id number] Arrival date & time: 07/25/19  2023     History   Chief Complaint Chief Complaint  Patient presents with   Abdominal Pain    HPI Samantha Graves is a 48 y.o. female with a past medical history of psoriatic arthritis, elevated LFTs status post liver biopsy on 07/16/2019 who presents today for evaluation of right upper quadrant pain.  She reports that since about 4 PM she has had worsening pain in the right upper quadrant.  It started while she was outside walking her dog.  She denies any trauma.  She denies nausea vomiting or diarrhea.  Reports that her bowel movements are normal for her.  No fevers cough, or shortness of breath.  She reports that she had some mild pain after the procedure however that had significantly improved and gotten better until today.  Her pain is in the same place where she had pain after the liver biopsy.  The biopsy was obtained to evaluate her chronically elevated LFTs.  Her pain is made worse with movement.  Made better with staying still.  Her pain does not radiate or move.     HPI  Past Medical History:  Diagnosis Date   Elevated LFTs    Psoriatic arthritis [facility])     Patient Active Problem List   Diagnosis Date Noted   Psoriasis 01/11/2017   High risk medication use 01/11/2017   PSORIATIC ARTHRITIS 10/14/2009   ALLERGIC RHINITIS 09/06/2007    Past Surgical History:  Procedure Laterality Date   INTRAUTERINE DEVICE INSERTION  05/30/15, 05/06/10   MIrena IUD   TONSILLECTOMY AND ADENOIDECTOMY  1990   WISDOM TOOTH EXTRACTION Bilateral 1991     OB History    Gravida  4   Para  4   Term  4   Preterm  0   AB  0   Living  4     SAB  0   TAB  0   Ectopic  0   Multiple  0   Live Births  4            Home Medications    Prior to Admission medications   Medication Sig Start Date End Date Taking? Authorizing Provider    amphetamine-dextroamphetamine (ADDERALL) 10 MG tablet Take 10 mg by mouth daily as needed.  06/25/19  Yes [provider]  Cyanocobalamin (VITAMIN B-12 PO) Take 1 tablet by mouth daily.    Yes [provider]  EVEKEO 10 MG TABS Take 1 tablet by mouth every morning. 01/01/17  Yes [provider]  fish oil-omega-3 fatty acids 1000 MG capsule Take 2 g by mouth daily.   Yes [provider]  vitamin D, CHOLECALCIFEROL, 400 UNITS tablet Take 400 Units by mouth daily.     Yes [provider]  levonorgestrel (MIRENA) 20 MCG/24HR IUD 1 each by Intrauterine route once. Mirena IUD. 05/30/15   [provider]    Family History Family History  Problem Relation Age of Onset   Hyperlipidemia Mother    Ovarian cysts Mother        benign   Endometriosis Mother    Liver disease Mother        NASH   Other Father        auto imune   Coronary artery disease Father        cardio ablation   COPD Maternal Grandmother    Bone cancer Maternal  Grandfather 7   Arthritis/Rheumatoid Sister 97   Endometriosis Sister    Retinitis pigmentosa Sister 106   Ulcerative colitis Sister 21       surgical repair, J pouch   Ovarian cancer Sister 77       negative genetic testing   Liver disease Maternal Aunt        NASH   Colon cancer Neg Hx    Esophageal cancer Neg Hx    Pancreatic cancer Neg Hx    Stomach cancer Neg Hx     Social History Social History   Tobacco Use   Smoking status: Never Smoker   Smokeless tobacco: Never Used  Substance Use Topics   Alcohol use: Yes    Alcohol/week: 5.0 - 7.0 standard drinks    Types: 5 - 7 Glasses of wine per week    Comment: over the last 3 months has up to 3 glasses per week only as of 06/26/19   Drug use: No     Allergies   Oseltamivir and Tamiflu [oseltamivir phosphate]   Review of Systems Review of Systems  Constitutional: Negative for chills and fever.  Respiratory: Negative for  cough and shortness of breath.   Cardiovascular: Negative for chest pain.  Gastrointestinal: Positive for abdominal pain. Negative for diarrhea, nausea and vomiting.  Musculoskeletal: Negative for back pain and neck pain.  All other systems reviewed and are negative.    Physical Exam Updated Vital Signs BP 110/71    Pulse (!) 59    Temp 97.7 F (36.5 C) (Oral)    Resp 17    Ht 5' 7.5" (1.715 m)    Wt 65.8 kg    SpO2 100%    BMI 22.38 kg/m   Physical Exam Vitals signs and nursing note reviewed.  Constitutional:      General: She is not in acute distress.    Appearance: She is well-developed. She is not diaphoretic.  HENT:     Head: Normocephalic and atraumatic.  Eyes:     General: No scleral icterus.       Right eye: No discharge.        Left eye: No discharge.     Conjunctiva/sclera: Conjunctivae normal.  Neck:     Musculoskeletal: Normal range of motion.  Cardiovascular:     Rate and Rhythm: Normal rate and regular rhythm.     Heart sounds: Normal heart sounds.  Pulmonary:     Effort: Pulmonary effort is normal. No respiratory distress.     Breath sounds: Normal breath sounds. No stridor.  Abdominal:     General: Bowel sounds are normal. There is no distension.     Palpations: Abdomen is soft. There is mass (RUQ).     Tenderness: There is abdominal tenderness in the right upper quadrant and epigastric area.     Hernia: No hernia is present.  Musculoskeletal:        General: No deformity.  Skin:    General: Skin is warm and dry.  Neurological:     General: No focal deficit present.     Mental Status: She is alert.     Motor: No abnormal muscle tone.  Psychiatric:        Mood and Affect: Mood normal.        Behavior: Behavior normal.      ED Treatments / Results  Labs (all labs ordered are listed, but only abnormal results are displayed) Labs Reviewed  COMPREHENSIVE METABOLIC PANEL - Abnormal; Notable for  the following components:      Result Value   AST 74  (*)    ALT 107 (*)    All other components within normal limits  LIPASE, BLOOD  CBC  URINALYSIS, ROUTINE W REFLEX MICROSCOPIC  I-STAT BETA HCG BLOOD, ED ([facility], [facility], [facility] ONLY)    EKG None  Radiology Ct Abdomen Pelvis W Contrast  Result Date: 07/26/2019 CLINICAL DATA:  Right upper quadrant pain EXAM: CT ABDOMEN AND PELVIS WITH CONTRAST TECHNIQUE: Multidetector CT imaging of the abdomen and pelvis was performed using the standard protocol following bolus administration of intravenous contrast. CONTRAST:  167mL OMNIPAQUE IOHEXOL 300 MG/ML  SOLN COMPARISON:  None. FINDINGS: LOWER CHEST: There is no basilar pleural or apical pericardial effusion. HEPATOBILIARY: There is an area of hypoattenuation in the right hepatic lobe likely secondary to recent liver biopsy. No perihepatic hematoma. The gallbladder is normal. PANCREAS: The pancreatic parenchymal contours are normal and there is no ductal dilatation. There is no peripancreatic fluid collection. SPLEEN: Normal. ADRENALS/URINARY TRACT: --Adrenal glands: Normal. --Right kidney/ureter: No hydronephrosis, nephroureterolithiasis, perinephric stranding or solid renal mass. --Left kidney/ureter: No hydronephrosis, nephroureterolithiasis, perinephric stranding or solid renal mass. --Urinary bladder: Normal for degree of distention STOMACH/BOWEL: --Stomach/Duodenum: There is no hiatal hernia or other gastric abnormality. The duodenal course and caliber are normal. --Small bowel: No dilatation or inflammation. --Colon: No focal abnormality.  Large amount of stool in the colon. --Appendix: Normal. VASCULAR/LYMPHATIC: Normal course and caliber of the major abdominal vessels. No abdominal or pelvic lymphadenopathy. REPRODUCTIVE: There is a T-shaped contraceptive device within the uterus. MUSCULOSKELETAL. No bony spinal canal stenosis or focal osseous abnormality. OTHER: None. IMPRESSION: 1. Area of relatively inhomogeneous attenuation within the right hepatic lobe is  likely the site of the recent biopsy. No perihepatic or subcapsular hematoma. 2. Large amount of stool within the colon, predominantly at the proximal colon. Electronically Signed   By: Ulyses Jarred M.D.   On: 07/26/2019 03:52    Procedures Procedures (including critical care time)  Medications Ordered in ED Medications  sodium chloride flush (NS) 0.9 % injection 3 mL (has no administration in time range)  fentaNYL (SUBLIMAZE) 100 MCG/2ML injection (has no administration in time range)  sodium chloride 0.9 % bolus 500 mL (0 mLs Intravenous Stopped 07/26/19 0235)  iohexol (OMNIPAQUE) 300 MG/ML solution 100 mL (100 mLs Intravenous Contrast Given 07/26/19 0322)     Initial Impression / Assessment and Plan / ED Course  I have reviewed the triage vital signs and the nursing notes.  Pertinent labs & imaging results that were available during my care of the patient were reviewed by me and considered in my medical decision making (see chart for details).       Patient presents today for evaluation of right upper quadrant abdominal pain.  On exam she has a palpable area of tenderness in the right upper quadrant.  She had a liver biopsy 10 days prior to her presentation and reports that this pain is in the same location.  Labs were obtained and reviewed, she does not have a leukocytosis, she is not anemic, her AST and ALT are grossly consistent with her baseline.  Urine does not appear infected.  Pregnancy test is negative.  She is afebrile not tachycardic or tachypneic does not meet Sirs or sepsis criteria.  Lipase is not elevated.  On CT scan she does not have evidence of hematoma from the biopsy, she does have a large amount of stool within the colon predominantly at  the proximal colon which appears consistent with the area I was able to palpate in her right upper quadrant.  She was given IV fluids and pain medicine after which she reports that she felt better.  I offered her a prescription for pain  medicine and nausea medicine at home, both of which she declined.  I recommended that she start taking MiraLAX, 2 doses a day with increased water.  Return precautions were discussed with patient who states their understanding.  At the time of discharge patient denied any unaddressed complaints or concerns.  Patient is agreeable for discharge home.   Final Clinical Impressions(s) / ED Diagnoses   Final diagnoses:  Generalized abdominal pain  Constipation, unspecified constipation type    ED Discharge Orders    None       Lorin Glass, Hershal Coria 07/26/19 0520    Orpah Greek, MD 07/26/19 2322

## 2019-07-26 NOTE — ED Notes (Signed)
Patient is resting comfortably. 

## 2019-07-26 NOTE — Discharge Instructions (Addendum)
Please start taking 1 capful of MiraLAX twice a day.  It is important that you take this with extra water.  This can take 1 to 2 days to produce a increased bowel movement.  Today your CT scan was reassuring and that there was not any evidence of significant bleeding or other abnormalities other than constipation.  If your symptoms worsen, you develop fevers, feel lightheaded, are unable to keep down food or water or have other concerns please seek additional medical care and evaluation.  Today you received medications that may make you sleepy or impair your ability to make decisions.  For the next 24 hours please do not drive, operate heavy machinery, care for a small child with out another adult present, or perform any activities that may cause harm to you or someone else if you were to fall asleep or be impaired.

## 2019-07-27 ENCOUNTER — Emergency Department (HOSPITAL_COMMUNITY): Payer: BC Managed Care – PPO

## 2019-07-27 ENCOUNTER — Encounter (HOSPITAL_COMMUNITY): Payer: Self-pay | Admitting: Emergency Medicine

## 2019-07-27 ENCOUNTER — Telehealth: Payer: Self-pay | Admitting: Physician Assistant

## 2019-07-27 ENCOUNTER — Other Ambulatory Visit: Payer: Self-pay

## 2019-07-27 ENCOUNTER — Telehealth: Payer: Self-pay | Admitting: Internal Medicine

## 2019-07-27 ENCOUNTER — Emergency Department (HOSPITAL_COMMUNITY)
Admission: EM | Admit: 2019-07-27 | Discharge: 2019-07-27 | Disposition: A | Discharge status: Home / Self Care | Payer: BC Managed Care – PPO | Attending: Emergency Medicine | Admitting: Emergency Medicine

## 2019-07-27 DIAGNOSIS — Z975 Presence of (intrauterine) contraceptive device: Secondary | ICD-10-CM | POA: Diagnosis not present

## 2019-07-27 DIAGNOSIS — Z9889 Other specified postprocedural states: Secondary | ICD-10-CM | POA: Diagnosis not present

## 2019-07-27 DIAGNOSIS — R1011 Right upper quadrant pain: Secondary | ICD-10-CM | POA: Diagnosis not present

## 2019-07-27 LAB — COMPREHENSIVE METABOLIC PANEL
ALT: 85 U/L — ABNORMAL HIGH (ref 0–44)
AST: 55 U/L — ABNORMAL HIGH (ref 15–41)
Albumin: 3.5 g/dL (ref 3.5–5.0)
Alkaline Phosphatase: 52 U/L (ref 38–126)
Anion gap: 6 (ref 5–15)
BUN: 11 mg/dL (ref 6–20)
CO2: 28 mmol/L (ref 22–32)
Calcium: 8.6 mg/dL — ABNORMAL LOW (ref 8.9–10.3)
Chloride: 103 mmol/L (ref 98–111)
Creatinine, Ser: 0.65 mg/dL (ref 0.44–1.00)
GFR calc Af Amer: >60 mL/min (ref >60)
GFR calc non Af Amer: >60 mL/min (ref >60)
Glucose, Bld: 103 mg/dL — ABNORMAL HIGH (ref 70–99)
Potassium: 3.8 mmol/L (ref 3.5–5.1)
Sodium: 137 mmol/L (ref 135–145)
Total Bilirubin: 1.1 mg/dL (ref 0.3–1.2)
Total Protein: 6.1 g/dL — ABNORMAL LOW (ref 6.5–8.1)

## 2019-07-27 LAB — CBC
HCT: 39.1 % (ref 36.0–46.0)
Hemoglobin: 13.2 g/dL (ref 12.0–15.0)
MCH: 32.4 pg (ref 26.0–34.0)
MCHC: 33.8 g/dL (ref 30.0–36.0)
MCV: 95.8 fL (ref 80.0–100.0)
Platelets: 135 10*3/uL — ABNORMAL LOW (ref 150–400)
RBC: 4.08 MIL/uL (ref 3.87–5.11)
RDW: 12 % (ref 11.5–15.5)
WBC: 6.7 10*3/uL (ref 4.0–10.5)
nRBC: 0 % (ref 0.0–0.2)

## 2019-07-27 LAB — LIPASE, BLOOD: Lipase: 28 U/L (ref 11–51)

## 2019-07-27 MED ORDER — BISACODYL 5 MG PO TBEC: 5.0000 mg | DELAYED_RELEASE_TABLET | Freq: Two times a day (BID) | ORAL | 0 refills | Status: DC

## 2019-07-27 MED ORDER — OXYCODONE HCL 5 MG PO TABS
5.0000 mg | ORAL_TABLET | Freq: Once | ORAL | Status: DC
  Filled 2019-07-27: qty 1

## 2019-07-27 MED ORDER — NAPROXEN 500 MG PO TABS: 500.0000 mg | ORAL_TABLET | Freq: Two times a day (BID) | ORAL | 0 refills | Status: DC

## 2019-07-27 MED ORDER — OXYCODONE HCL 5 MG PO TABS: 5.0000 mg | ORAL_TABLET | ORAL | 0 refills | Status: DC | PRN

## 2019-07-27 MED ORDER — KETOROLAC TROMETHAMINE 15 MG/ML IJ SOLN
15.0000 mg | Freq: Once | INTRAMUSCULAR | Status: AC
  Administered 2019-07-27: 15 mg via INTRAMUSCULAR
  Filled 2019-07-27: qty 1

## 2019-07-27 NOTE — ED Notes (Signed)
Discharge paperwork reviewed with pt, including prescriptions.  Pt tearful at time of discharge, expressing frustration that pain is still present.  Pt informed again of pain medication prescriptions, advised not to drive if taking narcotic pain medication.  Pt encouraged to follow up with her gastroenterologist. Pt verbalized understanding.

## 2019-07-27 NOTE — Telephone Encounter (Signed)
Per ER notes pt has been seen.

## 2019-07-27 NOTE — Discharge Instructions (Addendum)
You were evaluated in the Emergency Department and after careful evaluation, we did not find any emergent condition requiring admission or further testing in the hospital.  Your exam/testing today was overall reassuring.  Please use the Naprosyn anti-inflammatory medication as provided.  You can also use the oxycodone medication for more significant pain.  It is important that we do not cause or worsen any constipation.  Please take the Dulcolax medication as directed.  We also recommend over-the-counter MiraLAX.  Please take up to 6 capfuls of over-the-counter MiraLAX daily to achieve soft stools.  Please return to the Emergency Department if you experience any worsening of your condition.  We encourage you to follow up with a primary care provider.  Thank you for allowing [place] to be a part of your care.

## 2019-07-27 NOTE — ED Provider Notes (Signed)
[facility] Emergency Department Provider Note MRN:  [id number]  Arrival date & time: 07/27/19     Chief Complaint   Abdominal Pain   History of Present Illness   Samantha Graves is a 48 y.o. year-old female with a history of elevated LFTs presenting to the ED with chief complaint of abdominal pain.  Continued right upper quadrant abdominal pain.  Patient is 10 days removed from a liver biopsy.  Evaluated the emergency department 2 days ago, with reassuring labs and CT scan.  Patient did not want to take any strong pain medicine because she is also constipated.  Pain seems to be worsening, now associated with lightheadedness.  She denies chest pain or shortness of breath, but with deep breaths her pain worsens.  Denies dysuria, no lower abdominal pain, no fever.  Review of Systems  A complete 10 system review of systems was obtained and all systems are negative except as noted in the HPI and PMH.   Patient's Health History    Past Medical History:  Diagnosis Date  . Elevated LFTs   . Psoriatic arthritis [facility])     Past Surgical History:  Procedure Laterality Date  . INTRAUTERINE DEVICE INSERTION  05/30/15, 05/06/10   MIrena IUD  . TONSILLECTOMY AND ADENOIDECTOMY  1990  . WISDOM TOOTH EXTRACTION Bilateral 1991    Family History  Problem Relation Age of Onset  . Hyperlipidemia Mother   . Ovarian cysts Mother        benign  . Endometriosis Mother   . Liver disease Mother        NASH  . Other Father        auto imune  . Coronary artery disease Father        cardio ablation  . COPD Maternal Grandmother   . Bone cancer Maternal Grandfather 35  . Arthritis/Rheumatoid Sister 42  . Endometriosis Sister   . Retinitis pigmentosa Sister 1  . Ulcerative colitis Sister 21       surgical repair, J pouch  . Ovarian cancer Sister 15       negative genetic testing  . Liver disease Maternal Aunt        NASH  . Colon cancer Neg Hx   . Esophageal cancer Neg  Hx   . Pancreatic cancer Neg Hx   . Stomach cancer Neg Hx     Social History   Socioeconomic History  . Marital status: Married    Spouse name: Not on file  . Number of children: Not on file  . Years of education: Not on file  . Highest education level: Not on file  Occupational History  . Not on file  Social Needs  . Financial resource strain: Not on file  . Food insecurity    Worry: Not on file    Inability: Not on file  . Transportation needs    Medical: Not on file    Non-medical: Not on file  Tobacco Use  . Smoking status: Never Smoker  . Smokeless tobacco: Never Used  Substance and Sexual Activity  . Alcohol use: Yes    Alcohol/week: 5.0 - 7.0 standard drinks    Types: 5 - 7 Glasses of wine per week    Comment: over the last 3 months has up to 3 glasses per week only as of 06/26/19  . Drug use: No  . Sexual activity: Yes    Birth control/protection: I.U.D.  Lifestyle  . Physical activity  Days per week: Not on file    Minutes per session: Not on file  . Stress: Not on file  Relationships  . Social Herbalist on phone: Not on file    Gets together: Not on file    Attends religious service: Not on file    Active member of club or organization: Not on file    Attends meetings of clubs or organizations: Not on file    Relationship status: Not on file  . Intimate partner violence    Fear of current or ex partner: Not on file    Emotionally abused: Not on file    Physically abused: Not on file    Forced sexual activity: Not on file  Other Topics Concern  . Not on file  Social History Narrative  . Not on file     Physical Exam  Vital Signs and Nursing Notes reviewed Vitals:   07/27/19 1030 07/27/19 1100  BP: 113/68 111/69  Pulse: 63 61  Resp: 18 15  Temp:    SpO2: 100% 100%    CONSTITUTIONAL: Well-appearing, NAD NEURO:  Alert and oriented x 3, no focal deficits EYES:  eyes equal and reactive ENT/NECK:  no LAD, no JVD CARDIO: Regular  rate, well-perfused, normal S1 and S2 PULM:  CTAB no wheezing or rhonchi GI/GU:  normal bowel sounds, non-distended, non-tender MSK/SPINE:  No gross deformities, no edema SKIN:  no rash, atraumatic PSYCH:  Appropriate speech and behavior  Diagnostic and Interventional Summary    EKG Interpretation  Date/Time:  Friday July 27 2019 09:18:14 EDT Ventricular Rate:  62 PR Interval:    QRS Duration: 82 QT Interval:  418 QTC Calculation: 425 R Axis:   75 Text Interpretation:  Sinus rhythm Abnormal R-wave progression, early transition Confirmed by Gerlene Fee [id number]) on 07/27/2019 10:43:32 AM      Labs Reviewed  CBC - Abnormal; Notable for the following components:      Result Value   Platelets 135 (*)    All other components within normal limits  COMPREHENSIVE METABOLIC PANEL - Abnormal; Notable for the following components:   Glucose, Bld 103 (*)    Calcium 8.6 (*)    Total Protein 6.1 (*)    AST 55 (*)    ALT 85 (*)    All other components within normal limits  LIPASE, BLOOD    DG Chest 2 View  Final Result      Medications  oxyCODONE (Oxy IR/ROXICODONE) immediate release tablet 5 mg (0 mg Oral Hold 07/27/19 0924)  ketorolac (TORADOL) 15 MG/ML injection 15 mg (15 mg Intramuscular Given 07/27/19 [id number])     Procedures Critical Care  ED Course and Medical Decision Making  I have reviewed the triage vital signs and the nursing notes.  Pertinent labs & imaging results that were available during my care of the patient were reviewed by me and considered in my medical decision making (see below for details).  Favoring continued postoperative pain or discomfort from the liver biopsy.  Possibly some pleurisy or inflammation of the diaphragm.  Patient is without evidence of DVT, satting 100%, PERC negative.  Given the lightheadedness will obtain repeat labs to ensure there is no hematoma formation or blood loss.  Anticipating discharge with pain regiment.  Work-up is  unrevealing, she is appropriate for discharge with increased pain management.  Barth Kirks. Sedonia Small, MD [place] [email or web address]  Final Clinical Impressions(s) / ED Diagnoses  ICD-10-CM   1. RUQ abdominal pain  R10.11 DG Chest 2 View    DG Chest 2 View    ED Discharge Orders         Ordered    oxyCODONE (ROXICODONE) 5 MG immediate release tablet  Every 4 hours PRN     07/27/19 1112    bisacodyl (DULCOLAX) 5 MG EC tablet  2 times daily     07/27/19 1112    naproxen (NAPROSYN) 500 MG tablet  2 times daily     07/27/19 1112          Discharge Instructions Discussed with and Provided to Patient:   Discharge Instructions     You were evaluated in the Emergency Department and after careful evaluation, we did not find any emergent condition requiring admission or further testing in the hospital.  Your exam/testing today was overall reassuring.  Please use the Naprosyn anti-inflammatory medication as provided.  You can also use the oxycodone medication for more significant pain.  It is important that we do not cause or worsen any constipation.  Please take the Dulcolax medication as directed.  We also recommend over-the-counter MiraLAX.  Please take up to 6 capfuls of over-the-counter MiraLAX daily to achieve soft stools.  Please return to the Emergency Department if you experience any worsening of your condition.  We encourage you to follow up with a primary care provider.  Thank you for allowing [place] to be a part of your care.        Maudie Flakes, MD 07/27/19 1114

## 2019-07-27 NOTE — Telephone Encounter (Signed)
Pt stated that called at 5 AM for severe abdominal pain and was advised by Dr. Loletha Carrow to go to the ED.  Pt reported that she is still waiting to be seen and would like advice on what to do.

## 2019-07-27 NOTE — Telephone Encounter (Signed)
Pt's husband Carma Sullins called stating that pt was seen at the ED this morning again but nothing was found and they were not able to find the source of pt's pain. He is requesting an appt to see Dr. Hilarie Fredrickson asap, he states that pt was given a shot for the pain at the ED but he is concerned that pain will reoccurred again. His phone is  [phone].

## 2019-07-27 NOTE — ED Triage Notes (Signed)
Pt reports still having upper abd pains since she was seen here on Wed. Denies problems with n/v, bowels or urinary.

## 2019-07-27 NOTE — Telephone Encounter (Signed)
Spoke with Samantha Graves in IR at [facility], states he will contact the covering MD in IR and call the pt. Spoke with pts husband and he is aware, also let him know I would copy Dr. Hilarie Fredrickson on all this information.

## 2019-07-27 NOTE — Telephone Encounter (Signed)
I reviewed her labs, CT scan report from the past day or 2.  Certainly no sign of obvious bleeding complication, abscess or infection.  Her hemoglobin is normal, her white count is normal and CT scan does not show any obvious blood clot at the site.  I am not sure why she is having pain right at the liver biopsy site.  The CAT scan did suggest "an area of hypoattenuation in the right hepatic lobe likely secondary to recent liver biopsy".  I am honestly not sure what to make of that.  Can you please reach out to interventional radiology to let them know that she is feeling a lot of pain at the liver biopsy site and asked that they call her to discuss.  Thank you

## 2019-07-27 NOTE — Telephone Encounter (Signed)
Samantha Graves pt, husband calling states wife has been in the er twice in the last 40 hours for terrible abd pain where her liver bx was done. Er has not been able to find a cause for the pain. States one scan stated she was constipated, pt states this is not pain from constipation. Husband calling and requesting our doctors look over labs, xrays, to see if they have any idea what may be causing her discomfort. He is concerned with the weekend coming. Reports she was fine right after the biopsy and was running 3 miles soon after the bx and then the pain hit all of the sudden. As DOD please advise.

## 2019-07-27 NOTE — Progress Notes (Signed)
  Reached out to patient. Explained Dr. Pascal Lux reviewed images.  No signs of hematoma or post procedure complication.  Recommend stool softener or laxative, especially given stool burden in proximal colon to see if the gives some relief.  She expressed understanding.  Judie Grieve BLAIR PA-C 07/27/2019 4:51 PM

## 2019-07-30 ENCOUNTER — Encounter: Payer: Self-pay | Admitting: Internal Medicine

## 2019-07-30 ENCOUNTER — Telehealth: Payer: Self-pay | Admitting: Internal Medicine

## 2019-07-30 ENCOUNTER — Other Ambulatory Visit: Payer: Self-pay

## 2019-07-30 ENCOUNTER — Ambulatory Visit (INDEPENDENT_AMBULATORY_CARE_PROVIDER_SITE_OTHER): Payer: BC Managed Care – PPO | Admitting: Internal Medicine

## 2019-07-30 VITALS — BP 110/62 | HR 76 | Temp 97.4°F | Ht 67.5 in | Wt 150.8 lb

## 2019-07-30 DIAGNOSIS — R7989 Other specified abnormal findings of blood chemistry: Secondary | ICD-10-CM

## 2019-07-30 DIAGNOSIS — Z9889 Other specified postprocedural states: Secondary | ICD-10-CM

## 2019-07-30 DIAGNOSIS — R1011 Right upper quadrant pain: Secondary | ICD-10-CM

## 2019-07-30 DIAGNOSIS — R945 Abnormal results of liver function studies: Secondary | ICD-10-CM | POA: Diagnosis not present

## 2019-07-30 MED ORDER — PANTOPRAZOLE SODIUM 40 MG PO TBEC: 40.0000 mg | DELAYED_RELEASE_TABLET | Freq: Every day | ORAL | 0 refills | Status: DC

## 2019-07-30 NOTE — Patient Instructions (Signed)
We have sent the following medications to your pharmacy for you to pick up at your convenience: Pantoprazole 40 mg daily while taking NSAID's.  You have been scheduled for a HIDA scan at [facility] ([facility] on 08/03/2019. Please arrive 15 minutes prior to your scheduled appointment at [id number] am. Make certain not to have anything to eat or drink at least 6 hours prior to your test. You should also be sure to hold any opiate based medications. Should this appointment date or time not work well for you, please call radiology scheduling at [phone].  _______________________________________________________________ hepatobiliary (HIDA) scan is an imaging procedure used to diagnose problems in the liver, gallbladder and bile ducts. In the HIDA scan, a radioactive chemical or tracer is injected into a vein in your arm. The tracer is handled by the liver like bile. Bile is a fluid produced and excreted by your liver that helps your digestive system break down fats in the foods you eat. Bile is stored in your gallbladder and the gallbladder releases the bile when you eat a meal. A special nuclear medicine scanner (gamma camera) tracks the flow of the tracer from your liver into your gallbladder and small intestine.  During your HIDA scan  You'll be asked to change into a hospital gown before your HIDA scan begins. Your health care team will position you on a table, usually on your back. The radioactive tracer is then injected into a vein in your arm.The tracer travels through your bloodstream to your liver, where it's taken up by the bile-producing cells. The radioactive tracer travels with the bile from your liver into your gallbladder and through your bile ducts to your small intestine.You may feel some pressure while the radioactive tracer is injected into your vein. As you lie on the table, a special gamma camera is positioned over your abdomen taking pictures  of the tracer as it moves through your body. The gamma camera takes pictures continually for about an hour. You'll need to keep still during the HIDA scan. This can become uncomfortable, but you may find that you can lessen the discomfort by taking deep breaths and thinking about other things. Tell your health care team if you're uncomfortable. The radiologist will watch on a computer the progress of the radioactive tracer through your body. The HIDA scan may be stopped when the radioactive tracer is seen in the gallbladder and enters your small intestine. This typically takes about an hour. In some cases extra imaging will be performed if original images aren't satisfactory, if morphine is given to help visualize the gallbladder or if the medication CCK is given to look at the contraction of the gallbladder. This test typically takes 2 hours to complete. _______________________________________________________________  If you are age 48 or older, your body mass index should be between 23-30. Your Body mass index is 23.27 kg/m. If this is out of the aforementioned range listed, please consider follow up with your Primary Care Provider.  If you are age 79 or younger, your body mass index should be between 19-25. Your Body mass index is 23.27 kg/m. If this is out of the aformentioned range listed, please consider follow up with your Primary Care Provider.

## 2019-07-30 NOTE — Progress Notes (Signed)
Subjective:    Patient ID: Samantha Graves, female    DOB: 1971-06-08, 48 y.o.   MRN: [id number]  HPI Samantha Graves is a 48 year old female with a recent evaluation of elevated liver enzymes, history of psoriatic arthritis, ADHD who is seen urgently to evaluate right upper quadrant abdominal pain.  She is here alone today.  I saw her in initial consultation on 06/26/2019 to evaluate elevated liver enzymes.  We performed an extensive laboratory evaluation which showed a mild elevation in her iron studies.  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  Her ANA was positive.  IgG was normal.  Anti-smooth muscle and antimitochondrial antibodies were negative.  Celiac antibody was negative.  Viral hepatitis serologies were negative.  Ultrasound was unremarkable.  We sent her for a liver biopsy which was performed on 07/16/2019 by interventional radiology.  Liver biopsy showed a moderate acute/subacute hepatitis with mild bile duct injury.  There is no evidence of pathologic fibrosis.  The diagnosis note stated that this was a nonspecific moderate lobular inflammation with numerous necroinflammatory foci.  There were bile duct showing evidence of injury with some bile duct loss but without significant ductular reaction or ductopenia.  No granulomas are seen.  Trichrome stain showed a fine, reticular/immature sinusoidal fibrosis but definitive chronic mature fibrosis was not found.  Iron stain was negative.  Overall it was felt that this was nonspecific.  Differential included drug/toxin mediated injury, also could be seen in acute viral hepatitis.  Acute autoimmune hepatitis though less likely could not be ruled out.  She reports that last Wednesday evening into Thursday, 10 days after her liver biopsy, she developed initially mild right upper quadrant abdominal pain which slowly intensified and over 3 hours became  excruciating.  It was worse with movement, taking a deep breath.  It was a grabbing type pain.  It felt like a visceral deep pain and radiated towards the right shoulder.  Her right shoulder felt sore.  She had never had such pain.  She went to the ER where she had a CT scan which was largely unremarkable.  She was given naproxen 500 mg twice a day.  She was then seen again in the ER on the 28th.  Reassurance was given.  She reports that the pain does not seem to relate to eating though her husband initially felt like it did.  It is crescendo decrescendo and is worse in the evening.  Is worse lying flat.  It is worse if she stretches her abdominal musculature such as reaching up to grab something.  Sneezing would be extremely painful.  She also has noticed having more hiccups.  She is had a mild headache and some very mild nausea.  No vomiting.  No fevers.  Yesterday she noticed a burning pain on the skin of her right upper quadrant without definitive rash.  It was almost an itchy type soreness on the skin.  She has not been exercising though she did take several relatively short, 3 miles, runs early in the week last week before the pain started.  Review of Systems As per HPI, otherwise negative  Current Medications, Allergies, Past Medical History, Past Surgical History, Family History and Social History were reviewed in [employer] record.      Objective:   Physical Exam BP 110/62    Pulse 76    Temp (!) 97.4 F (36.3 C)    Ht  5' 7.5" (1.715 m)    Wt 150 lb 12.8 oz (68.4 kg)    BMI 23.27 kg/m  Gen: awake, alert, NAD HEENT: anicteric, op clear CV: RRR, no mrg Pulm: CTA b/l Abd: soft, there is definitive tenderness in the right upper quadrant near the expected location of the gallbladder with deep palpation, there is no rebound or guarding,ND, +BS throughout Ext: no c/c/e Skin: There is very faint lacy erythema and 2 very small areas on the right flank at the lower rib  cage, no vesicles seen Neuro: nonfocal  CMP     Component Value Date/Time   NA 137 07/27/2019 0921   K 3.8 07/27/2019 0921   CL 103 07/27/2019 0921   CO2 28 07/27/2019 0921   GLUCOSE 103 (H) 07/27/2019 0921   BUN 11 07/27/2019 0921   CREATININE 0.65 07/27/2019 0921   CREATININE 0.84 05/10/2019 1345   CALCIUM 8.6 (L) 07/27/2019 0921   PROT 6.1 (L) 07/27/2019 0921   ALBUMIN 3.5 07/27/2019 0921   AST 55 (H) 07/27/2019 0921   ALT 85 (H) 07/27/2019 0921   ALKPHOS 52 07/27/2019 0921   BILITOT 1.1 07/27/2019 0921   GFRNONAA >60 07/27/2019 0921   GFRNONAA 83 05/10/2019 1345   GFRAA >60 07/27/2019 0921   GFRAA 96 05/10/2019 1345   CBC    Component Value Date/Time   WBC 6.7 07/27/2019 0921   RBC 4.08 07/27/2019 0921   HGB 13.2 07/27/2019 0921   HGB 13.2 04/02/2013 1416   HCT 39.1 07/27/2019 0921   PLT 135 (L) 07/27/2019 0921   MCV 95.8 07/27/2019 0921   MCH 32.4 07/27/2019 0921   MCHC 33.8 07/27/2019 0921   RDW 12.0 07/27/2019 0921   LYMPHSABS 1.5 07/16/2019 1155   MONOABS 0.5 07/16/2019 1155   EOSABS 0.1 07/16/2019 1155   BASOSABS 0.0 07/16/2019 1155   Lipase     Component Value Date/Time   LIPASE 28 07/27/2019 0921   CT ABDOMEN AND PELVIS WITH CONTRAST   TECHNIQUE: Multidetector CT imaging of the abdomen and pelvis was performed using the standard protocol following bolus administration of intravenous contrast.   CONTRAST:  18mL OMNIPAQUE IOHEXOL 300 MG/ML  SOLN   COMPARISON:  None.   FINDINGS: LOWER CHEST: There is no basilar pleural or apical pericardial effusion.   HEPATOBILIARY: There is an area of hypoattenuation in the right hepatic lobe likely secondary to recent liver biopsy. No perihepatic hematoma. The gallbladder is normal.   PANCREAS: The pancreatic parenchymal contours are normal and there is no ductal dilatation. There is no peripancreatic fluid collection.   SPLEEN: Normal.   ADRENALS/URINARY TRACT:   --Adrenal glands: Normal.     --Right kidney/ureter: No hydronephrosis, nephroureterolithiasis, perinephric stranding or solid renal mass.   --Left kidney/ureter: No hydronephrosis, nephroureterolithiasis, perinephric stranding or solid renal mass.   --Urinary bladder: Normal for degree of distention   STOMACH/BOWEL:   --Stomach/Duodenum: There is no hiatal hernia or other gastric abnormality. The duodenal course and caliber are normal.   --Small bowel: No dilatation or inflammation.   --Colon: No focal abnormality.  Large amount of stool in the colon.   --Appendix: Normal.   VASCULAR/LYMPHATIC: Normal course and caliber of the major abdominal vessels. No abdominal or pelvic lymphadenopathy.   REPRODUCTIVE: There is a T-shaped contraceptive device within the uterus.   MUSCULOSKELETAL. No bony spinal canal stenosis or focal osseous abnormality.   OTHER: None.   IMPRESSION: 1. Area of relatively inhomogeneous attenuation within the right hepatic lobe is  likely the site of the recent biopsy. No perihepatic or subcapsular hematoma. 2. Large amount of stool within the colon, predominantly at the proximal colon.     Electronically Signed   By: Ulyses Jarred M.D.   On: 07/26/2019 03:52   CLINICAL DATA:  Right upper quadrant pain    EXAM: CHEST - 2 VIEW   COMPARISON:  None.   FINDINGS: The heart size and mediastinal contours are within normal limits. Both lungs are clear. The visualized skeletal structures are unremarkable.   IMPRESSION: No active cardiopulmonary disease.     Electronically Signed   By: Franchot Gallo M.D.   On: 07/27/2019 09:55  ABDOMEN ULTRASOUND COMPLETE   COMPARISON:  None.   FINDINGS: Gallbladder: No gallstones or wall thickening visualized. There is no pericholecystic fluid. No sonographic Murphy sign noted by sonographer.   Common bile duct: Diameter: 3 mm. No intrahepatic, common hepatic, or common bile duct dilatation.   Liver: No focal lesion  identified. Within normal limits in parenchymal echogenicity. Portal vein is patent on color Doppler imaging with normal direction of blood flow towards the liver.   IVC: No abnormality visualized.   Pancreas: No pancreatic mass or inflammatory focus.   Spleen: Size and appearance within normal limits.   Right Kidney: Length: 10.2 cm. Echogenicity within normal limits. No mass or hydronephrosis visualized.   Left Kidney: Length: 10.5 cm. Echogenicity within normal limits. No mass or hydronephrosis visualized.   Abdominal aorta: No aneurysm visualized.   Other findings: No demonstrable ascites.   IMPRESSION: Study within normal limits.     Electronically Signed   By: Lowella Grip III M.D.   On: 07/06/2019 10:35         Assessment & Plan:  48 year old female with a recent evaluation of elevated liver enzymes, history of psoriatic arthritis, ADHD who is seen urgently to evaluate right upper quadrant abdominal pain.   1. RUQ pain in patient with recent evaluation for elevated liver enzymes and liver biopsy performed 14 days ago --I am suspicious that there may have been trauma at the liver where her biopsy was performed, perhaps related to activity such as running (this would be unexpected and I did clear her to begin light to moderate exercise).  The pain is worse with movement and stretching.  Cannot exclude gallbladder pain given the nature (crescendo decrescendo, worse at night, perhaps worse an hour or so after eating).  We discussed how both liver capsular pain and gallbladder pain can cause right shoulder pain, which she is experiencing.  There is an extremely subtle very small area that has a faint pink lacy area.  I cannot exclude evolving shingles.  Her labs and CT findings are reassuring. --We will continue the naproxen 500 mg twice daily for a few more days --Add pantoprazole 40 mg daily just while she is on high-dose NSAID to protect against gastritis --I am holding  off on starting valacyclovir given the small but known risk of liver enzyme elevation.  I am in a check with her tomorrow and if there is ongoing allodynia/skin pain or rash I will start antiviral therapy --HIDA scan to be ordered and performed as soon as possible, evaluate gallbladder function and rule out bile leak (bile leak very unlikely after biopsy 14 days ago) --We are having the pathology read by Dr. Luvenia Heller at [facility] for a second opinion regarding the inflammation seen for biopsy.  This is pending.  2.  Constipation? --She does not have clinical constipation.  Her bowel movements are loose but sizable daily, usually once.  She has been on stool softeners as the CT scan suggested increased colonic stool burden.  I do not think this is causing her symptoms.  She can stop the stool softener.  3.  Elevated liver enzymes --I do not think this is related to the acute pain but we will follow-up with HIDA scan and also the pathology second opinion from [place]  25 minutes spent with the patient today. Greater than 50% was spent in counseling and coordination of care with the patient

## 2019-07-30 NOTE — Telephone Encounter (Signed)
Pt calling to check in to see if Dr. Hilarie Fredrickson thinks she needs to be seen this week. She states she wonders if perhaps she has/had shingles. States last night she started having surface nerve pain in the same area. Describes it as a burning itchy pain and the area was warm to touch. Not really a rash present. States the pain is worse at night usually around 9pm it starts. Now she describes it as a mild pain, she is really careful if she has to bend over. States the Naprosyn has helped. Describes overall a sensitive skin pain, doesn't want clothing to touch certain areas. Please advise.

## 2019-07-30 NOTE — Telephone Encounter (Signed)
I need to see the patient in person for examination Please place on my schedule today at 430 pm I will see her after my [place] procedures Thanks

## 2019-07-31 ENCOUNTER — Telehealth: Payer: Self-pay | Admitting: Internal Medicine

## 2019-07-31 NOTE — Telephone Encounter (Signed)
Pt returned your call, pls call her again.  °

## 2019-07-31 NOTE — Telephone Encounter (Signed)
Spoke to patient to advise that HIDA was moved to [facility] tomorrow, 08/01/19 at 10 am. She verbalizes understanding.

## 2019-08-01 ENCOUNTER — Other Ambulatory Visit: Payer: Self-pay

## 2019-08-01 ENCOUNTER — Telehealth: Payer: Self-pay | Admitting: Internal Medicine

## 2019-08-01 ENCOUNTER — Ambulatory Visit (HOSPITAL_COMMUNITY)
Admission: RE | Admit: 2019-08-01 | Discharge: 2019-08-01 | Disposition: A | Discharge status: Home / Self Care | Payer: BC Managed Care – PPO | Source: Ambulatory Visit | Attending: Internal Medicine | Admitting: Internal Medicine

## 2019-08-01 DIAGNOSIS — R1011 Right upper quadrant pain: Secondary | ICD-10-CM | POA: Diagnosis not present

## 2019-08-01 DIAGNOSIS — R945 Abnormal results of liver function studies: Secondary | ICD-10-CM

## 2019-08-01 DIAGNOSIS — R7989 Other specified abnormal findings of blood chemistry: Secondary | ICD-10-CM

## 2019-08-01 DIAGNOSIS — R110 Nausea: Secondary | ICD-10-CM | POA: Diagnosis not present

## 2019-08-01 MED ORDER — TECHNETIUM TC 99M MEBROFENIN IV KIT
5.0000 | PACK | Freq: Once | INTRAVENOUS | Status: AC | PRN
  Administered 2019-08-01: 10:00:00 5.43 via INTRAVENOUS

## 2019-08-01 MED ORDER — STERILE WATER FOR INJECTION IJ SOLN
INTRAMUSCULAR | Status: AC
  Administered 2019-08-01: 12:00:00 1.36 mL
  Filled 2019-08-01: qty 10

## 2019-08-01 MED ORDER — SINCALIDE 5 MCG IJ SOLR
INTRAMUSCULAR | Status: AC
  Administered 2019-08-01: 1.36 ug
  Filled 2019-08-01: qty 5

## 2019-08-01 NOTE — Telephone Encounter (Signed)
Order needs to be e-signed by Dr. Hilarie Fredrickson. Discussed with nuclear med that Dr. Hilarie Fredrickson is out sick. Order printed out, stamped and faxed to [facility] at [phone].

## 2019-08-03 ENCOUNTER — Other Ambulatory Visit: Payer: Self-pay | Admitting: General Surgery

## 2019-08-03 DIAGNOSIS — K828 Other specified diseases of gallbladder: Secondary | ICD-10-CM | POA: Diagnosis not present

## 2019-08-03 NOTE — Telephone Encounter (Signed)
Pt reported that she is scheduled for surgery next week and would like a call with Dr. Henrene Pastor to discuss CT.

## 2019-08-07 ENCOUNTER — Telehealth: Payer: Self-pay | Admitting: *Deleted

## 2019-08-07 NOTE — Telephone Encounter (Signed)
Please contact the patient and let her know that I spoke with Dr. Hilarie Fredrickson, earlier today, who is going to reach out to her himself.  Thanks

## 2019-08-07 NOTE — Telephone Encounter (Signed)
Lab Results - documented in this encounter  Pathology - Slide Consult (07/16/2019) Pathology - Slide Consult (07/16/2019)  Component Value Ref Range Performed At Pathologist Signature  Case Report Surgical Pathology Report             Case: [id number]                 Authorizing Provider: Jaquita Folds, MD    Collected:      07/16/2019          Ordering Location:   [facility] and Received:      08/03/2019 [place]                  Cytopathology                                Pathologist:      Thelma Barge, MD                           Specimen:  Liver, [facility]   DIAGNOSIS A. 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 See comment and microscopic description. Negative for pathologic fibrosis.  Comment: The generous liver core biopsy shows moderate to severe portal inflammation with fairly diffuse severe interface and lobular hepatitis. Several small plasma cell groups are present.  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 All the injury is "fresh"/recent; no established fibrosis is identified.  However, it should be noted that the histopathologic features of AIH in isolation are non-specific and therefore clinical correlation is required to rule out drug-induced hepatitis.  [place] AND CYTOPATHOLOGY Electronically signed by Thelma Barge, MD on 08/07/2019 at 1:41 PM  Clinical Information Elevated LFT's of uncertain ctiology, post US guided liver  biopsy. This 48 year old patient has a history of psoriatic arthritis. Her ALT and AST have been mildly to moderately elevated since June 2020. Alkaline phosphatase is normal. ANA is positive at 1:80. ASMA is negative. AMA is negaitve. Ceruloplasmin is negative.  [place] AND CYTOPATHOLOGY   Gross Examination Outside case A: "[id number]" Date of surgery: 07-16-19 Number of stained slides: 7 Number of blocks: 0 Number of unstained slides: 0  Outside path report received? Yes Material to be returned? Yes  Received from:  Dr Zenovia Jarred [facility] Department of Pathology [address] [place], [place] [address] Tel: [phone]  Fax: [place]   Microscopic Examination Microscopic examination is performed.    Four core fragments of liver are present measuring 37 mm in aggregate length and 27 portal triads are seen. Triads show moderate to severe chronic inflammation comprised of small lymphocytes, several plasma cells and a few eosinophils. Most triads show moderate to severe interface necrosis. Several interface acidophil bodies are present. Patchy mild bile duct injury is seen. However, the immune cells do not appear to directly "attack" the bile ducts. No "florid duct lesions" are seen. No portal granulomas are identified. Portal vessels are  within normal limits. The lobular parenchyma shows many areas of chronic inflammation with hepatocyte drop out. Patchy lobular areas with small confluent hepatocyte drop out is present. Many scattered lobular acidophil bodies are present. Many lobular foci of chronic inflammation contain ceroid-laden macrophages. Several central zones show chronic inflammation including small groups of plasma cells and hepatocyte drop out. No distinct cholestasis is identified. Trichrome stain is negative for pathologic fibrosis. This confirms that the necrotic changes are all very  recent. In other words, no evidence of acute on chronic hepatitis is seen. Iron stain is negative. PASD stain confirms several scattered groups of ceroid-laden macrophages. PASD is negative for histologic evidence of alpha-1 antitrypsin deficiency. Reticulin stain shows patchy moderate hepatic plate disarray.   [facility]   Disclaimer All immunohistochemical and in situ hybridization tests performed at [facility] and reported herein were developed, validated and their performance characteristics determined by the, [facility]. During the performance of these tests, appropriate positive and negative control slides are also performed and reviewed. All control slides and internal controls (when applicable) demonstrate the expected immunoreactive patterns and/or nucleic acid hybridization. Some of the tests may not be cleared or approved by the U.S. Food and Drug Administration (FDA). The FDA has determined that such clearance or approval is not necessary. These tests are used for clinical purposes and should not be regarded as investigational or as research. This laboratory is certified under the [place] (CLIA) as qualified to perform high complexity clinical testing.  [facility]   Attestation All of the diagnostic evaluations on the enumerated specimens have been personally conducted by the pathologists involved in the care of this patient as indicated by the electronic signatures above.  [place]    Pathology - Slide Consult (07/16/2019)  Specimen  Tissue-Pathology - Liver structure (body structure)   Pathology - Slide Consult (07/16/2019)  Performing Organization Address City/State/Zipcode Phone Number  [facility]  [address]  [place], [place] [address]  [phone]

## 2019-08-07 NOTE — Telephone Encounter (Signed)
Patient is asking to speak with Dr. Henrene Pastor in Dr. Vena Rua absence.  She has questions about upcoming surgery.  She indicated you may be expecting her call.

## 2019-08-07 NOTE — Telephone Encounter (Signed)
I left a detailed message for the patient with Dr. Blanch Media response

## 2019-08-09 ENCOUNTER — Other Ambulatory Visit (HOSPITAL_COMMUNITY)
Admission: RE | Admit: 2019-08-09 | Discharge: 2019-08-09 | Disposition: A | Discharge status: Home / Self Care | Payer: BC Managed Care – PPO | Source: Ambulatory Visit | Attending: General Surgery | Admitting: General Surgery

## 2019-08-09 DIAGNOSIS — Z20828 Contact with and (suspected) exposure to other viral communicable diseases: Secondary | ICD-10-CM | POA: Diagnosis not present

## 2019-08-09 DIAGNOSIS — Z01812 Encounter for preprocedural laboratory examination: Secondary | ICD-10-CM | POA: Insufficient documentation

## 2019-08-10 ENCOUNTER — Other Ambulatory Visit: Payer: Self-pay

## 2019-08-10 DIAGNOSIS — R7989 Other specified abnormal findings of blood chemistry: Secondary | ICD-10-CM

## 2019-08-10 LAB — NOVEL CORONAVIRUS, NAA (HOSP ORDER, SEND-OUT TO REF LAB; TAT 18-24 HRS): SARS-CoV-2, NAA: NOT DETECTED

## 2019-08-13 ENCOUNTER — Encounter (HOSPITAL_COMMUNITY)
Admission: RE | Disposition: A | Discharge status: Home / Self Care | Payer: Self-pay | Source: Home / Self Care | Attending: General Surgery

## 2019-08-13 ENCOUNTER — Ambulatory Visit (HOSPITAL_COMMUNITY): Payer: BC Managed Care – PPO | Admitting: Anesthesiology

## 2019-08-13 ENCOUNTER — Ambulatory Visit (HOSPITAL_COMMUNITY)
Admission: RE | Admit: 2019-08-13 | Discharge: 2019-08-13 | Disposition: A | Discharge status: Home / Self Care | Payer: BC Managed Care – PPO | Attending: General Surgery | Admitting: General Surgery

## 2019-08-13 ENCOUNTER — Encounter (HOSPITAL_COMMUNITY): Payer: Self-pay

## 2019-08-13 DIAGNOSIS — F909 Attention-deficit hyperactivity disorder, unspecified type: Secondary | ICD-10-CM | POA: Insufficient documentation

## 2019-08-13 DIAGNOSIS — L4050 Arthropathic psoriasis, unspecified: Secondary | ICD-10-CM | POA: Insufficient documentation

## 2019-08-13 DIAGNOSIS — Z79899 Other long term (current) drug therapy: Secondary | ICD-10-CM | POA: Diagnosis not present

## 2019-08-13 DIAGNOSIS — J309 Allergic rhinitis, unspecified: Secondary | ICD-10-CM | POA: Diagnosis not present

## 2019-08-13 DIAGNOSIS — Z975 Presence of (intrauterine) contraceptive device: Secondary | ICD-10-CM | POA: Diagnosis not present

## 2019-08-13 DIAGNOSIS — K754 Autoimmune hepatitis: Secondary | ICD-10-CM | POA: Insufficient documentation

## 2019-08-13 DIAGNOSIS — R7989 Other specified abnormal findings of blood chemistry: Secondary | ICD-10-CM | POA: Diagnosis not present

## 2019-08-13 DIAGNOSIS — Z793 Long term (current) use of hormonal contraceptives: Secondary | ICD-10-CM | POA: Insufficient documentation

## 2019-08-13 DIAGNOSIS — K811 Chronic cholecystitis: Secondary | ICD-10-CM | POA: Diagnosis not present

## 2019-08-13 DIAGNOSIS — K828 Other specified diseases of gallbladder: Secondary | ICD-10-CM | POA: Diagnosis not present

## 2019-08-13 HISTORY — DX: Other specified postprocedural states: Z98.890

## 2019-08-13 HISTORY — PX: CHOLECYSTECTOMY: SHX55

## 2019-08-13 HISTORY — DX: Attention-deficit hyperactivity disorder, unspecified type: F90.9

## 2019-08-13 HISTORY — DX: Nausea with vomiting, unspecified: R11.2

## 2019-08-13 LAB — COMPREHENSIVE METABOLIC PANEL
ALT: 53 U/L — ABNORMAL HIGH (ref 0–44)
AST: 48 U/L — ABNORMAL HIGH (ref 15–41)
Albumin: 3.6 g/dL (ref 3.5–5.0)
Alkaline Phosphatase: 43 U/L (ref 38–126)
Anion gap: 10 (ref 5–15)
BUN: 14 mg/dL (ref 6–20)
CO2: 24 mmol/L (ref 22–32)
Calcium: 8.8 mg/dL — ABNORMAL LOW (ref 8.9–10.3)
Chloride: 104 mmol/L (ref 98–111)
Creatinine, Ser: 0.82 mg/dL (ref 0.44–1.00)
GFR calc Af Amer: >60 mL/min (ref >60)
GFR calc non Af Amer: >60 mL/min (ref >60)
Glucose, Bld: 90 mg/dL (ref 70–99)
Potassium: 3.8 mmol/L (ref 3.5–5.1)
Sodium: 138 mmol/L (ref 135–145)
Total Bilirubin: 0.5 mg/dL (ref 0.3–1.2)
Total Protein: 6 g/dL — ABNORMAL LOW (ref 6.5–8.1)

## 2019-08-13 LAB — CBC
HCT: 40.4 % (ref 36.0–46.0)
Hemoglobin: 13.5 g/dL (ref 12.0–15.0)
MCH: 31.6 pg (ref 26.0–34.0)
MCHC: 33.4 g/dL (ref 30.0–36.0)
MCV: 94.6 fL (ref 80.0–100.0)
Platelets: 194 10*3/uL (ref 150–400)
RBC: 4.27 MIL/uL (ref 3.87–5.11)
RDW: 11.9 % (ref 11.5–15.5)
WBC: 4.3 10*3/uL (ref 4.0–10.5)
nRBC: 0 % (ref 0.0–0.2)

## 2019-08-13 LAB — POCT PREGNANCY, URINE: Preg Test, Ur: NEGATIVE

## 2019-08-13 SURGERY — LAPAROSCOPIC CHOLECYSTECTOMY
Anesthesia: General | Site: Abdomen

## 2019-08-13 MED ORDER — LIDOCAINE 2% (20 MG/ML) 5 ML SYRINGE
INTRAMUSCULAR | Status: DC | PRN
  Administered 2019-08-13: 40 mg via INTRAVENOUS

## 2019-08-13 MED ORDER — PHENYLEPHRINE 40 MCG/ML (10ML) SYRINGE FOR IV PUSH (FOR BLOOD PRESSURE SUPPORT)
PREFILLED_SYRINGE | INTRAVENOUS | Status: DC | PRN
  Administered 2019-08-13 (×4): 80 ug via INTRAVENOUS

## 2019-08-13 MED ORDER — MIDAZOLAM HCL 2 MG/2ML IJ SOLN
INTRAMUSCULAR | Status: AC
  Filled 2019-08-13: qty 2

## 2019-08-13 MED ORDER — PROMETHAZINE HCL 25 MG/ML IJ SOLN
6.2500 mg | INTRAMUSCULAR | Status: DC | PRN
  Administered 2019-08-13: 6.25 mg via INTRAVENOUS

## 2019-08-13 MED ORDER — 0.9 % SODIUM CHLORIDE (POUR BTL) OPTIME
TOPICAL | Status: DC | PRN
  Administered 2019-08-13: 1000 mL

## 2019-08-13 MED ORDER — FENTANYL CITRATE (PF) 100 MCG/2ML IJ SOLN
25.0000 ug | INTRAMUSCULAR | Status: DC | PRN
  Administered 2019-08-13: 25 ug via INTRAVENOUS

## 2019-08-13 MED ORDER — PROPOFOL 10 MG/ML IV BOLUS
INTRAVENOUS | Status: AC
  Filled 2019-08-13: qty 20

## 2019-08-13 MED ORDER — BUPIVACAINE-EPINEPHRINE 0.25% -1:200000 IJ SOLN
INTRAMUSCULAR | Status: DC | PRN
  Administered 2019-08-13: 13 mL

## 2019-08-13 MED ORDER — PROPOFOL 10 MG/ML IV BOLUS
INTRAVENOUS | Status: DC | PRN
  Administered 2019-08-13: 150 mg via INTRAVENOUS

## 2019-08-13 MED ORDER — SCOPOLAMINE 1 MG/3DAYS TD PT72
MEDICATED_PATCH | TRANSDERMAL | Status: AC
  Filled 2019-08-13: qty 1

## 2019-08-13 MED ORDER — CEFAZOLIN SODIUM-DEXTROSE 2-4 GM/100ML-% IV SOLN
2.0000 g | INTRAVENOUS | Status: AC
  Administered 2019-08-13: 2 g via INTRAVENOUS
  Filled 2019-08-13: qty 100

## 2019-08-13 MED ORDER — DEXAMETHASONE SODIUM PHOSPHATE 10 MG/ML IJ SOLN
INTRAMUSCULAR | Status: DC | PRN
  Administered 2019-08-13: 10 mg via INTRAVENOUS

## 2019-08-13 MED ORDER — MIDAZOLAM HCL 5 MG/5ML IJ SOLN
INTRAMUSCULAR | Status: DC | PRN
  Administered 2019-08-13: 2 mg via INTRAVENOUS

## 2019-08-13 MED ORDER — HEMOSTATIC AGENTS (NO CHARGE) OPTIME
TOPICAL | Status: DC | PRN
  Administered 2019-08-13: 1 via TOPICAL

## 2019-08-13 MED ORDER — LACTATED RINGERS IV SOLN
INTRAVENOUS | Status: DC
  Administered 2019-08-13: 08:00:00 via INTRAVENOUS

## 2019-08-13 MED ORDER — SODIUM CHLORIDE 0.9 % IR SOLN
Status: DC | PRN
  Administered 2019-08-13: 1000 mL

## 2019-08-13 MED ORDER — FENTANYL CITRATE (PF) 100 MCG/2ML IJ SOLN
INTRAMUSCULAR | Status: DC | PRN
  Administered 2019-08-13: 100 ug via INTRAVENOUS
  Administered 2019-08-13: 25 ug via INTRAVENOUS

## 2019-08-13 MED ORDER — ROCURONIUM BROMIDE 10 MG/ML (PF) SYRINGE
PREFILLED_SYRINGE | INTRAVENOUS | Status: DC | PRN
  Administered 2019-08-13: 60 mg via INTRAVENOUS

## 2019-08-13 MED ORDER — GABAPENTIN 100 MG PO CAPS
100.0000 mg | ORAL_CAPSULE | ORAL | Status: AC
  Administered 2019-08-13: 100 mg via ORAL
  Filled 2019-08-13: qty 1

## 2019-08-13 MED ORDER — STERILE WATER FOR IRRIGATION IR SOLN
Status: DC | PRN
  Administered 2019-08-13: 1000 mL

## 2019-08-13 MED ORDER — BUPIVACAINE-EPINEPHRINE (PF) 0.25% -1:200000 IJ SOLN
INTRAMUSCULAR | Status: AC
  Filled 2019-08-13: qty 30

## 2019-08-13 MED ORDER — ROCURONIUM BROMIDE 10 MG/ML (PF) SYRINGE
PREFILLED_SYRINGE | INTRAVENOUS | Status: AC
  Filled 2019-08-13: qty 10

## 2019-08-13 MED ORDER — SCOPOLAMINE 1 MG/3DAYS TD PT72
1.0000 | MEDICATED_PATCH | TRANSDERMAL | Status: DC
  Administered 2019-08-13: 1.5 mg via TRANSDERMAL
  Filled 2019-08-13: qty 1

## 2019-08-13 MED ORDER — FENTANYL CITRATE (PF) 250 MCG/5ML IJ SOLN
INTRAMUSCULAR | Status: AC
  Filled 2019-08-13: qty 5

## 2019-08-13 MED ORDER — PROMETHAZINE HCL 25 MG/ML IJ SOLN
INTRAMUSCULAR | Status: AC
  Filled 2019-08-13: qty 1

## 2019-08-13 MED ORDER — ACETAMINOPHEN 500 MG PO TABS
1000.0000 mg | ORAL_TABLET | ORAL | Status: DC
  Filled 2019-08-13: qty 2

## 2019-08-13 MED ORDER — SUGAMMADEX SODIUM 200 MG/2ML IV SOLN
INTRAVENOUS | Status: DC | PRN
  Administered 2019-08-13: 200 mg via INTRAVENOUS

## 2019-08-13 MED ORDER — KETOROLAC TROMETHAMINE 15 MG/ML IJ SOLN
15.0000 mg | INTRAMUSCULAR | Status: AC
  Administered 2019-08-13: 15 mg via INTRAVENOUS
  Filled 2019-08-13: qty 1

## 2019-08-13 MED ORDER — PHENYLEPHRINE 40 MCG/ML (10ML) SYRINGE FOR IV PUSH (FOR BLOOD PRESSURE SUPPORT)
PREFILLED_SYRINGE | INTRAVENOUS | Status: AC
  Filled 2019-08-13: qty 10

## 2019-08-13 MED ORDER — LIDOCAINE 2% (20 MG/ML) 5 ML SYRINGE
INTRAMUSCULAR | Status: AC
  Filled 2019-08-13: qty 5

## 2019-08-13 MED ORDER — NEOSTIGMINE METHYLSULFATE 3 MG/3ML IV SOSY
PREFILLED_SYRINGE | INTRAVENOUS | Status: AC
  Filled 2019-08-13: qty 3

## 2019-08-13 MED ORDER — GLYCOPYRROLATE PF 0.2 MG/ML IJ SOSY
PREFILLED_SYRINGE | INTRAMUSCULAR | Status: AC
  Filled 2019-08-13: qty 1

## 2019-08-13 MED ORDER — FENTANYL CITRATE (PF) 100 MCG/2ML IJ SOLN
INTRAMUSCULAR | Status: AC
  Filled 2019-08-13: qty 2

## 2019-08-13 MED ORDER — OXYCODONE HCL 5 MG PO TABS: 5.0000 mg | ORAL_TABLET | Freq: Four times a day (QID) | ORAL | 0 refills | Status: DC | PRN

## 2019-08-13 MED ORDER — ONDANSETRON HCL 4 MG/2ML IJ SOLN
INTRAMUSCULAR | Status: DC | PRN
  Administered 2019-08-13: 4 mg via INTRAVENOUS

## 2019-08-13 SURGICAL SUPPLY — 44 items
ADH SKN CLS APL DERMABOND .7 (GAUZE/BANDAGES/DRESSINGS) ×1
APL PRP STRL LF DISP 70% ISPRP (MISCELLANEOUS) ×1
APPLIER CLIP 5 13 M/L LIGAMAX5 (MISCELLANEOUS) ×3
APR CLP MED LRG 5 ANG JAW (MISCELLANEOUS) ×1
BAG SPEC RTRVL 10 TROC 200 (ENDOMECHANICALS) ×1
BLADE CLIPPER SURG (BLADE) IMPLANT
CANISTER SUCT 3000ML PPV (MISCELLANEOUS) ×3 IMPLANT
CHLORAPREP W/TINT 26 (MISCELLANEOUS) ×3 IMPLANT
CLIP APPLIE 5 13 M/L LIGAMAX5 (MISCELLANEOUS) ×1 IMPLANT
CLOSURE STERI-STRIP 1/4X4 (GAUZE/BANDAGES/DRESSINGS) ×2 IMPLANT
CLOSURE WOUND 1/2 X4 (GAUZE/BANDAGES/DRESSINGS) ×1
COVER SURGICAL LIGHT HANDLE (MISCELLANEOUS) ×3 IMPLANT
COVER WAND RF STERILE (DRAPES) ×1 IMPLANT
DERMABOND ADVANCED (GAUZE/BANDAGES/DRESSINGS) ×2
DERMABOND ADVANCED .7 DNX12 (GAUZE/BANDAGES/DRESSINGS) ×1 IMPLANT
ELECT REM PT RETURN 9FT ADLT (ELECTROSURGICAL) ×3
ELECTRODE REM PT RTRN 9FT ADLT (ELECTROSURGICAL) ×1 IMPLANT
GLOVE BIO SURGEON STRL SZ7 (GLOVE) ×5 IMPLANT
GLOVE BIOGEL PI IND STRL 7.5 (GLOVE) ×1 IMPLANT
GLOVE BIOGEL PI INDICATOR 7.5 (GLOVE) ×4
GOWN STRL REUS W/ TWL LRG LVL3 (GOWN DISPOSABLE) ×3 IMPLANT
GOWN STRL REUS W/TWL LRG LVL3 (GOWN DISPOSABLE) ×9
GRASPER SUT TROCAR 14GX15 (MISCELLANEOUS) ×3 IMPLANT
KIT BASIN OR (CUSTOM PROCEDURE TRAY) ×3 IMPLANT
KIT TURNOVER KIT B (KITS) ×3 IMPLANT
NS IRRIG 1000ML POUR BTL (IV SOLUTION) ×3 IMPLANT
PAD ARMBOARD 7.5X6 YLW CONV (MISCELLANEOUS) ×3 IMPLANT
POUCH RETRIEVAL ECOSAC 10 (ENDOMECHANICALS) ×1 IMPLANT
POUCH RETRIEVAL ECOSAC 10MM (ENDOMECHANICALS) ×2
SCISSORS LAP 5X35 DISP (ENDOMECHANICALS) ×3 IMPLANT
SET IRRIG TUBING LAPAROSCOPIC (IRRIGATION / IRRIGATOR) ×3 IMPLANT
SET TUBE SMOKE EVAC HIGH FLOW (TUBING) ×3 IMPLANT
SLEEVE ENDOPATH XCEL 5M (ENDOMECHANICALS) ×6 IMPLANT
SPECIMEN JAR SMALL (MISCELLANEOUS) ×3 IMPLANT
STRIP CLOSURE SKIN 1/2X4 (GAUZE/BANDAGES/DRESSINGS) ×2 IMPLANT
SURGICEL SNOW 2X4 (HEMOSTASIS) ×2 IMPLANT
SUT MNCRL AB 4-0 PS2 18 (SUTURE) ×5 IMPLANT
SUT VICRYL 0 UR6 27IN ABS (SUTURE) ×3 IMPLANT
TOWEL GREEN STERILE (TOWEL DISPOSABLE) ×3 IMPLANT
TOWEL GREEN STERILE FF (TOWEL DISPOSABLE) ×3 IMPLANT
TRAY LAPAROSCOPIC MC (CUSTOM PROCEDURE TRAY) ×3 IMPLANT
TROCAR XCEL BLUNT TIP 100MML (ENDOMECHANICALS) ×3 IMPLANT
TROCAR XCEL NON-BLD 5MMX100MML (ENDOMECHANICALS) ×3 IMPLANT
WATER STERILE IRR 1000ML POUR (IV SOLUTION) ×3 IMPLANT

## 2019-08-13 NOTE — Transfer of Care (Signed)
Immediate Anesthesia Transfer of Care Note  Patient: Samantha Graves  Procedure(s) Performed: LAPAROSCOPIC CHOLECYSTECTOMY (N/A Abdomen)  Patient Location: PACU  Anesthesia Type:General  Level of Consciousness: drowsy  Airway & Oxygen Therapy: Patient Spontanous Breathing and Patient connected to nasal cannula oxygen  Post-op Assessment: Report given to RN and Post -op Vital signs reviewed and stable  Post vital signs: Reviewed and stable  Last Vitals:  Vitals Value Taken Time  BP    Temp 36.1 C 08/13/19 1048  Pulse    Resp    SpO2      Last Pain:  Vitals:   08/13/19 1048  PainSc: (P) Asleep      Patients Stated Pain Goal: 6 ([id number])  Complications: No apparent anesthesia complications

## 2019-08-13 NOTE — Op Note (Signed)
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 Serita Grammes Anesthesia: General Estimated blood loss: 10 cc Drains: None Complications: None Specimens: Gallbladder and contents to pathology Disposition to recovery in stable condition Sponge needle count was correct at completion  Indications: This is a healthy 48 year old female was noted to have elevated liver function test.  She is undergone an evaluation with gastroenterology including a liver biopsy and it appears that she may have autoimmune hepatitis.  She also has pain that certainly sounds biliary in nature as well.  She has a HIDA scan that shows a nonfilling gallbladder.  We discussed all of the options and I discussed her case with gastroenterology and we have elected to proceed with a cholecystectomy with the understanding that this may not solve all of her issues with her pain.  Procedure: After informed consent was obtained the patient was taken the operating.  She was given antibiotics.  SCDs were in place.  She was placed under general anesthesia without complication.  Her abdomen was prepped and draped in the standard sterile surgical fashion.  A surgical timeout was then performed.  I infiltrated Marcaine below the umbilicus and made a vertical incision.  I carried this to the fascia.  The fascia was entered sharply.  The peritoneum was entered bluntly.  There was no evidence of an entry injury.  I placed a 0 Vicryl pursestring suture to the fascia.  I then inserted a Hassan trocar and insufflated the abdomen to 15 mmHg pressure.  I inserted 3 further 5 mm trochars in the epigastrium and right side of the abdomen under direct vision without complication.  Her liver was noted to be adherent to the anterior abdominal wall.  This came down with blunt dissection very easily.  I think this is related to her prior biopsy.   The gallbladder was then retracted cephalad and lateral.  She did appear to have evidence of chronic cholecystitis.  I then was able to dissect the triangle.  There was a fair amount of scarring in the triangle.  The common bile duct was completely visible during this portion of the operation.  I then clipped the cystic artery 3 times and divided it leaving 2 clips in place.  I treated the cystic duct in a similar fashion.  The cystic duct was viable and the clips completely traversed the cystic duct.  The gallbladder was then removed from the liver bed.  This was placed in a retrieval bag and removed from the abdomen.  Hemostasis was observed.  I then remove the [facility] trocar and tied down my pursestring.  I placed an additional 0 Vicryl suture using the suture passer device.  This completely obliterated the defect.  The abdomen was then desufflated.  I then removed the trochars.  These were closed with 4-0 Monocryl and glue.  She tolerated this well was extubated and transferred to recovery stable.

## 2019-08-13 NOTE — H&P (Signed)
Samantha Graves is an 48 y.o. female.   Chief Complaint: elevated lfts, gb dyskinesia HPI: 24 yof referred by Dr Samantha Graves for possible gb disease. she has psoriatic arthritis and has been on humira. she was undergoing routine lab follow up and was noted to have elevated transminases. she stopped humira. the ast/alt increased and eventually she stopped all medications except evekeo. she has stopped etoh. she had an US done 8/7 that was negative for gb pathology. she then underwent US guided liver biopsy and pathology was fibrosis. there is second opinion pending on this now also. she over past week or so has significant ruq pain after biopsy. this is associated with nausea, no emesis, not eating much now. she has been seen in the er for this. she had ct scan that is negative, not really any hematoma from biopsy and certainly nothing to explain her ongoing pain. the acute pain is better but she sitll has ongoing discomfort. she then underwent hida with cck that shows nl filling of the gb and small bowel with 0% ef. the report states she had no symptoms after cck but she reports she had the crampy ruq pain she knows. she was then referred for evaluation   Past Medical History:  Diagnosis Date  . ADHD   . Elevated LFTs   . PONV (postoperative nausea and vomiting)   . Psoriatic arthritis [facility])     Past Surgical History:  Procedure Laterality Date  . INTRAUTERINE DEVICE INSERTION  05/30/15, 05/06/10   MIrena IUD  . TONSILLECTOMY AND ADENOIDECTOMY  1990  . WISDOM TOOTH EXTRACTION Bilateral 1991    Family History  Problem Relation Age of Onset  . Hyperlipidemia Mother   . Ovarian cysts Mother        benign  . Endometriosis Mother   . Liver disease Mother        NASH  . Other Father        auto imune  . Coronary artery disease Father        cardio ablation  . COPD Maternal Grandmother   . Bone cancer Maternal Grandfather 100  . Arthritis/Rheumatoid Sister 41  . Endometriosis Sister    . Retinitis pigmentosa Sister 61  . Ulcerative colitis Sister 21       surgical repair, J pouch  . Ovarian cancer Sister 69       negative genetic testing  . Liver disease Maternal Aunt        NASH  . Colon cancer Neg Hx   . Esophageal cancer Neg Hx   . Pancreatic cancer Neg Hx   . Stomach cancer Neg Hx    Social History:  reports that she has never smoked. She has never used smokeless tobacco. She reports current alcohol use of about 5.0 - 7.0 standard drinks of alcohol per week. She reports that she does not use drugs.  Allergies:  Allergies  Allergen Reactions  . Tamiflu [Oseltamivir Phosphate] Nausea And Vomiting    Medications Prior to Admission  Medication Sig Dispense Refill  . EVEKEO 10 MG TABS Take 10 mg by mouth 2 (two) times daily.   0  . levonorgestrel (MIRENA) 20 MCG/24HR IUD 1 each by Intrauterine route once. Mirena IUD.    Marland Kitchen naproxen (NAPROSYN) 500 MG tablet Take 1 tablet (500 mg total) by mouth 2 (two) times daily. (Patient taking differently: Take 500 mg by mouth 2 (two) times daily as needed for moderate pain. ) 30 tablet 0  . oxyCODONE (  ROXICODONE) 5 MG immediate release tablet Take 1 tablet (5 mg total) by mouth every 4 (four) hours as needed for severe pain. (Patient not taking: Reported on 07/30/2019) 12 tablet 0  . pantoprazole (PROTONIX) 40 MG tablet Take 1 tablet (40 mg total) by mouth daily. (Patient not taking: Reported on 08/09/2019) 30 tablet 0    Results for orders placed or performed during the hospital encounter of 08/13/19 (from the past 48 hour(s))  CBC     Status: None   Collection Time: 08/13/19  7:21 AM  Result Value Ref Range   WBC 4.3 4.0 - 10.5 K/uL   RBC 4.27 3.87 - 5.11 MIL/uL   Hemoglobin 13.5 12.0 - 15.0 g/dL   HCT 40.4 36.0 - 46.0 %   MCV 94.6 80.0 - 100.0 fL   MCH 31.6 26.0 - 34.0 pg   MCHC 33.4 30.0 - 36.0 g/dL   RDW 11.9 11.5 - 15.5 %   Platelets 194 150 - 400 K/uL   nRBC 0.0 0.0 - 0.2 %    Comment: Performed at [facility], [place] [address], [place], [place] [address]  Comprehensive metabolic panel     Status: Abnormal   Collection Time: 08/13/19  7:21 AM  Result Value Ref Range   Sodium 138 135 - 145 mmol/L   Potassium 3.8 3.5 - 5.1 mmol/L   Chloride 104 98 - 111 mmol/L   CO2 24 22 - 32 mmol/L   Glucose, Bld 90 70 - 99 mg/dL   BUN 14 6 - 20 mg/dL   Creatinine, Ser 0.82 0.44 - 1.00 mg/dL   Calcium 8.8 (L) 8.9 - 10.3 mg/dL   Total Protein 6.0 (L) 6.5 - 8.1 g/dL   Albumin 3.6 3.5 - 5.0 g/dL   AST 48 (H) 15 - 41 U/L   ALT 53 (H) 0 - 44 U/L   Alkaline Phosphatase 43 38 - 126 U/L   Total Bilirubin 0.5 0.3 - 1.2 mg/dL   GFR calc non Af Amer >60 >60 mL/min   GFR calc Af Amer >60 >60 mL/min   Anion gap 10 5 - 15    Comment: Performed at [facility] [address], [place], [place] [address]  Pregnancy, urine POC     Status: None   Collection Time: 08/13/19  7:36 AM  Result Value Ref Range   Preg Test, Ur NEGATIVE NEGATIVE    Comment:        THE SENSITIVITY OF THIS METHODOLOGY IS >24 mIU/mL    No results found.  Review of Systems  Gastrointestinal: Positive for abdominal pain.  All other systems reviewed and are negative.   Blood pressure (!) 111/53, pulse 62, temperature 98.3 F (36.8 C), resp. rate 17, SpO2 100 %. Physical Exam   Vitals (Samantha Herrin RN; 08/03/2019 12:03 PM) 08/03/2019 12:02 PM Weight: 149.13 lb Temp.: 97.24F (Oral)  Pulse: 87 (Regular)  P.OX: 98% (Room air) BP: 116/64(Sitting, Left Arm, Standard) Physical Exam Samantha Bookbinder MD; 08/03/2019 1:32 PM) General Mental Status-Alert. Orientation-Oriented X3. Eye Sclera/Conjunctiva - Bilateral-No scleral icterus. Chest and Lung Exam Chest and lung exam reveals -quiet, even and easy respiratory effort with no use of accessory muscles. Cardiovascular Cardiovascular examination reveals -normal heart sounds, regular rate and rhythm with no murmurs. Abdomen Note: soft nondistended tender ruq and  epigastrium, no murphys sign Neurologic Neurologic evaluation reveals -alert and oriented x 3 with no impairment of recent or remote memory.   Assessment/Plan Assessment & Plan Samantha Graves  Samantha Mangels MD; 08/03/2019 1:34 PM)  BILIARY DYSKINESIA (K82.8) Story: Laparoscopic cholecystectomy It does sound like these symptoms could be gb related and I cannot find another etiology. I think reasonable to consider lap chole. I did tell her there is chance this may not cure her symptoms at all. I discussed the procedure in detail. We discussed the risks and benefits of a laparoscopic cholecystectomy and possible cholangiogram including, but not limited to bleeding, infection, injury to surrounding structures such as the intestine or liver, bile leak, retained gallstones, need to convert to an open procedure, prolonged diarrhea, blood clots such as DVT, common bile duct injury, anesthesia risks, and possible need for additional procedures. The likelihood of improvement in symptoms and return to the patient's normal status is good. We discussed the typical post-operative recovery course.   I discussed case with Dr Samantha Graves who does think she has AI hepatitis but both of [place] think reasonable to do cholecystectomy as well  Samantha Bookbinder, MD 08/13/2019, 8:29 AM

## 2019-08-13 NOTE — Discharge Instructions (Signed)
[facility] LAPAROSCOPIC SURGERY: POST OP INSTRUCTIONS  Always review your discharge instruction sheet given to you by the facility where your surgery was performed. IF YOU HAVE DISABILITY OR FAMILY LEAVE FORMS, YOU MUST BRING THEM TO THE OFFICE FOR PROCESSING.   DO NOT GIVE THEM TO YOUR DOCTOR. Use 400 mg ibuprofen every 8 hours for next 3 days and ice at umbilical incision several times daily.  Use oxycodone as needed if that is not helpful.  1. A prescription for pain medication may be given to you upon discharge.  Take your pain medication as prescribed, if needed.  If narcotic pain medicine is not needed, then you may take acetaminophen (Tylenol), naprosyn (Alleve), or ibuprofen (Advil) as needed. 2. Take your usually prescribed medications unless otherwise directed. 3. If you need a refill on your pain medication, please contact your pharmacy.  They will contact our office to request authorization. Prescriptions will not be filled after 5pm or on week-ends. 4. You should follow a light diet the first few days after arrival home, such as soup and crackers, etc.  Be sure to include lots of fluids daily. 5. Most patients will experience some swelling and bruising in the area of the incisions.  Ice packs will help.  Swelling and bruising can take several days to resolve.  6. It is common to experience some constipation if taking pain medication after surgery.  Increasing fluid intake and taking a stool softener (such as Colace) will usually help or prevent this problem from occurring.  A mild laxative (Milk of Magnesia or Miralax) should be taken according to package instructions if there are no bowel movements after 48 hours. 7. Unless discharge instructions indicate otherwise, you may remove your bandages 48 hours after surgery, and you may shower at that time.  You may have steri-strips (small skin tapes) in place directly over the incision.   These strips should be left on the skin for 7-10 days.  If your surgeon used skin glue on the incision, you may shower in 24 hours.  The glue will flake off over the next 2-3 weeks.  Any sutures or staples will be removed at the office during your follow-up visit. 8. ACTIVITIES:  You may resume regular (light) daily activities beginning the next day--such as daily self-care, walking, climbing stairs--gradually increasing activities as tolerated.  You may have sexual intercourse when it is comfortable.  Refrain from any heavy lifting or straining until approved by your doctor. a. You may drive when you are no longer taking prescription pain medication, you can comfortably wear a seatbelt, and you can safely maneuver your car and apply brakes. b. RETURN TO WORK:  __________________________________________________________ 9. You should see your doctor in the office for a follow-up appointment approximately 2-3 weeks after your surgery.  Make sure that you call for this appointment within a day or two after you arrive home to insure a convenient appointment time. 10. OTHER INSTRUCTIONS: __________________________________________________________________________________________________________________________ __________________________________________________________________________________________________________________________ WHEN TO CALL YOUR DOCTOR: 1. Fever over 101.0 2. Inability to urinate 3. Continued bleeding from incision. 4. Increased pain, redness, or drainage from the incision. 5. Increasing abdominal pain  The clinic staff is available to answer your questions during regular business hours.  Please dont hesitate to call and ask to speak to one of the nurses for clinical concerns.  If you have a medical emergency, go to the nearest emergency room or call 911.  A surgeon from [facility] is always on call  at the hospital. [address] [place], [place], [place]  [address]  ? [address], [place], [place]   [address] [phone] ? [phone] ? FAX [phone] Web site: [email or web address]

## 2019-08-13 NOTE — Interval H&P Note (Signed)
History and Physical Interval Note:  08/13/2019 8:33 AM  Samantha Graves  has presented today for surgery, with the diagnosis of BILIARY DYSKINESIA.  The various methods of treatment have been discussed with the patient and family. After consideration of risks, benefits and other options for treatment, the patient has consented to  Procedure(s): LAPAROSCOPIC CHOLECYSTECTOMY (N/A) as a surgical intervention.  The patient's history has been reviewed, patient examined, no change in status, stable for surgery.  I have reviewed the patient's chart and labs.  Questions were answered to the patient's satisfaction.     Rolm Bookbinder

## 2019-08-13 NOTE — Anesthesia Procedure Notes (Signed)
Procedure Name: Intubation Date/Time: 08/13/2019 9:01 AM Performed by: Kyung Rudd, CRNA Pre-anesthesia Checklist: Patient identified, Emergency Drugs available, Suction available, Patient being monitored and Timeout performed Patient Re-evaluated:Patient Re-evaluated prior to induction Oxygen Delivery Method: Circle system utilized Preoxygenation: Pre-oxygenation with 100% oxygen Induction Type: IV induction Ventilation: Mask ventilation without difficulty Laryngoscope Size: Mac and 3 Grade View: Grade I Tube type: Oral Tube size: 7.0 mm Number of attempts: 1 Airway Equipment and Method: Stylet Placement Confirmation: ETT inserted through vocal cords under direct vision,  positive ETCO2 and breath sounds checked- equal and bilateral Secured at: 20 cm Tube secured with: Tape Dental Injury: Teeth and Oropharynx as per pre-operative assessment

## 2019-08-13 NOTE — Anesthesia Preprocedure Evaluation (Signed)
Anesthesia Evaluation  Patient identified by MRN, date of birth, ID band Patient awake    Reviewed: Allergy & Precautions, NPO status , Patient's Chart, lab work & pertinent test results  History of Anesthesia Complications (+) PONV  Airway Mallampati: II  TM Distance: >3 FB     Dental  (+) Dental Advisory Given   Pulmonary neg pulmonary ROS,    breath sounds clear to auscultation       Cardiovascular negative cardio ROS   Rhythm:Regular Rate:Normal     Neuro/Psych negative neurological ROS     GI/Hepatic negative GI ROS, Elevated LFT's   Endo/Other  negative endocrine ROS  Renal/GU negative Renal ROS     Musculoskeletal  (+) Arthritis ,   Abdominal   Peds  Hematology negative hematology ROS (+)   Anesthesia Other Findings   Reproductive/Obstetrics                             Anesthesia Physical Anesthesia Plan  ASA: II  Anesthesia Plan: General   Post-op Pain Management:    Induction: Intravenous  PONV Risk Score and Plan: 4 or greater and Midazolam, Scopolamine patch - Pre-op, Dexamethasone, Ondansetron and Treatment may vary due to age or medical condition  Airway Management Planned: Oral ETT  Additional Equipment:   Intra-op Plan:   Post-operative Plan: Extubation in OR  Informed Consent: I have reviewed the patients History and Physical, chart, labs and discussed the procedure including the risks, benefits and alternatives for the proposed anesthesia with the patient or authorized representative who has indicated his/her understanding and acceptance.     Dental advisory given  Plan Discussed with: CRNA  Anesthesia Plan Comments:         Anesthesia Quick Evaluation

## 2019-08-14 ENCOUNTER — Encounter (HOSPITAL_COMMUNITY): Payer: Self-pay | Admitting: General Surgery

## 2019-08-14 NOTE — Anesthesia Postprocedure Evaluation (Signed)
Anesthesia Post Note  Patient: Samantha Graves  Procedure(s) Performed: LAPAROSCOPIC CHOLECYSTECTOMY (N/A Abdomen)     Patient location during evaluation: PACU Anesthesia Type: General Level of consciousness: awake and alert Pain management: pain level controlled Vital Signs Assessment: post-procedure vital signs reviewed and stable Respiratory status: spontaneous breathing, nonlabored ventilation, respiratory function stable and patient connected to nasal cannula oxygen Cardiovascular status: blood pressure returned to baseline and stable Postop Assessment: no apparent nausea or vomiting Anesthetic complications: no    Last Vitals:  Vitals:   08/13/19 1203 08/13/19 1218  BP: 114/66 105/69  Pulse: (!) 52 (!) 58  Resp: 12 19  Temp:  (!) 36.2 C  SpO2: 96% 100%    Last Pain:  Vitals:   08/13/19 1218  PainSc: 4                  Tiajuana Amass

## 2019-08-31 ENCOUNTER — Other Ambulatory Visit (INDEPENDENT_AMBULATORY_CARE_PROVIDER_SITE_OTHER): Payer: BC Managed Care – PPO

## 2019-08-31 DIAGNOSIS — R7989 Other specified abnormal findings of blood chemistry: Secondary | ICD-10-CM | POA: Diagnosis not present

## 2019-08-31 LAB — HEPATIC FUNCTION PANEL
ALT: 34 U/L (ref 0–35)
AST: 31 U/L (ref 0–37)
Albumin: 4.1 g/dL (ref 3.5–5.2)
Alkaline Phosphatase: 51 U/L (ref 39–117)
Bilirubin, Direct: 0.1 mg/dL (ref 0.0–0.3)
Total Bilirubin: 0.7 mg/dL (ref 0.2–1.2)
Total Protein: 6.5 g/dL (ref 6.0–8.3)

## 2019-09-04 ENCOUNTER — Other Ambulatory Visit: Payer: Self-pay

## 2019-09-04 DIAGNOSIS — Z20822 Contact with and (suspected) exposure to covid-19: Secondary | ICD-10-CM

## 2019-09-04 DIAGNOSIS — Z20828 Contact with and (suspected) exposure to other viral communicable diseases: Secondary | ICD-10-CM | POA: Diagnosis not present

## 2019-09-06 LAB — NOVEL CORONAVIRUS, NAA: SARS-CoV-2, NAA: NOT DETECTED

## 2019-09-07 ENCOUNTER — Other Ambulatory Visit: Payer: Self-pay

## 2019-09-07 ENCOUNTER — Ambulatory Visit (INDEPENDENT_AMBULATORY_CARE_PROVIDER_SITE_OTHER): Payer: BC Managed Care – PPO | Admitting: Orthopedic Surgery

## 2019-09-07 ENCOUNTER — Ambulatory Visit: Payer: Self-pay

## 2019-09-07 ENCOUNTER — Encounter: Payer: Self-pay | Admitting: Orthopedic Surgery

## 2019-09-07 VITALS — Ht 67.5 in | Wt 145.0 lb

## 2019-09-07 DIAGNOSIS — S2231XA Fracture of one rib, right side, initial encounter for closed fracture: Secondary | ICD-10-CM | POA: Diagnosis not present

## 2019-09-07 DIAGNOSIS — M546 Pain in thoracic spine: Secondary | ICD-10-CM | POA: Diagnosis not present

## 2019-09-07 DIAGNOSIS — R0781 Pleurodynia: Secondary | ICD-10-CM | POA: Diagnosis not present

## 2019-09-07 DIAGNOSIS — R7989 Other specified abnormal findings of blood chemistry: Secondary | ICD-10-CM

## 2019-09-07 NOTE — Progress Notes (Signed)
Office Visit Note   Patient: Samantha Graves           Date of Birth: 1971/06/14           MRN: [id number] Visit Date: 09/07/2019 Requested by: Fay Records, MD [address] [place],  [place] [address] PCP: Fay Records, MD  Subjective: Chief Complaint  Patient presents with  . Chest - Pain    DOI 09/04/2019    HPI: Adam Phenix is a patient with rib pain.  She had a fall 2010 05/19/2019.  This was a fall out of a tree onto a brick wall and then down onto the ground.  There was no loss of consciousness.  3 weeks ago she had her gallbladder out for what sounds like essentially a nonfunctioning gallbladder.  Her liver enzymes have come down since that time.  She also has autoimmune disease which is been controlled on biologic immunosuppressants.  She does report some clicking in the rib cage area as well as significant pain.  She also reports some point tenderness at the inferior aspect of the rib cage which was present prior to the fall which may be associated with the liver biopsy she had prior to gallbladder removal.              ROS: All systems reviewed are negative as they relate to the chief complaint within the history of present illness.  Patient denies  fevers or chills.   Assessment & Plan: Visit Diagnoses:  1. Rib pain on right side   2. Pain in thoracic spine     Plan: Impression is right-sided rib fracture with no pneumothorax.  This should heal on its own.  Displacement is on the order of 1 to 2 mm.  Plan is observation.  I think that should be a self-limited problem.  I think the mechanism of injury matches the injured structures.  I do not think based on her exercise history that osteoporosis or osteopenia really is at play here.  I will see her back as needed.  Follow-Up Instructions: No follow-ups on file.   Orders:  Orders Placed This Encounter  Procedures  . XR Thoracic Spine 2 View  . XR Ribs Unilateral Right   No orders of the defined types were  placed in this encounter.     Procedures: No procedures performed   Clinical Data: No additional findings.  Objective: Vital Signs: Ht 5' 7.5" (1.715 m)   Wt 145 lb (65.8 kg)   BMI 22.38 kg/m   Physical Exam:   Constitutional: Patient appears well-developed HEENT:  Head: Normocephalic Eyes:EOM are normal Neck: Normal range of motion Cardiovascular: Normal rate Pulmonary/chest: Effort normal Neurologic: Patient is alert Skin: Skin is warm Psychiatric: Patient has normal mood and affect    Ortho Exam: Ortho exam demonstrates point tenderness in the right rib cage around ribs 789.  There is a bruise in this area.  Some point tenderness at the inferior aspect of the rib cage on the right.  She has good arm range of motion.  Breathing is normal and unlabored  Specialty Comments:  No specialty comments available.  Imaging: Xr Ribs Unilateral Right  Result Date: 09/07/2019 Rib radiographs on the right-hand side do show a mildly displaced rib fracture.  No pneumothorax is present.  Xr Thoracic Spine 2 View  Result Date: 09/07/2019 AP lateral thoracic spine reviewed.  No acute compression fractures present.  Mild scoliosis of the cervicothoracic region is noted.  PMFS History: Patient Active Problem List   Diagnosis Date Noted  . Psoriasis 01/11/2017  . High risk medication use 01/11/2017  . PSORIATIC ARTHRITIS 10/14/2009  . ALLERGIC RHINITIS 09/06/2007   Past Medical History:  Diagnosis Date  . ADHD   . Elevated LFTs   . PONV (postoperative nausea and vomiting)   . Psoriatic arthritis ([place])     Family History  Problem Relation Age of Onset  . Hyperlipidemia Mother   . Ovarian cysts Mother        benign  . Endometriosis Mother   . Liver disease Mother        NASH  . Other Father        auto imune  . Coronary artery disease Father        cardio ablation  . COPD Maternal Grandmother   . Bone cancer Maternal Grandfather 78  . Arthritis/Rheumatoid  Sister 8  . Endometriosis Sister   . Retinitis pigmentosa Sister 64  . Ulcerative colitis Sister 21       surgical repair, J pouch  . Ovarian cancer Sister 37       negative genetic testing  . Liver disease Maternal Aunt        NASH  . Colon cancer Neg Hx   . Esophageal cancer Neg Hx   . Pancreatic cancer Neg Hx   . Stomach cancer Neg Hx     Past Surgical History:  Procedure Laterality Date  . CHOLECYSTECTOMY N/A 08/13/2019   Procedure: LAPAROSCOPIC CHOLECYSTECTOMY;  Surgeon: Rolm Bookbinder, MD;  Location: [facility];  Service: General;  Laterality: N/A;  . INTRAUTERINE DEVICE INSERTION  05/30/15, 05/06/10   MIrena IUD  . TONSILLECTOMY AND ADENOIDECTOMY  1990  . WISDOM TOOTH EXTRACTION Bilateral 1991   Social History   Occupational History  . Not on file  Tobacco Use  . Smoking status: Never Smoker  . Smokeless tobacco: Never Used  Substance and Sexual Activity  . Alcohol use: Yes    Alcohol/week: 5.0 - 7.0 standard drinks    Types: 5 - 7 Glasses of wine per week    Comment: none since 06/30/2019 over the last 3 months has up to 3 glasses per week only as of 06/26/19  . Drug use: No  . Sexual activity: Yes    Birth control/protection: I.U.D.

## 2019-09-10 ENCOUNTER — Encounter: Payer: Self-pay | Admitting: *Deleted

## 2019-09-18 ENCOUNTER — Ambulatory Visit: Payer: BC Managed Care – PPO | Admitting: Internal Medicine

## 2019-10-09 ENCOUNTER — Other Ambulatory Visit: Payer: Self-pay

## 2019-10-10 ENCOUNTER — Encounter: Payer: Self-pay | Admitting: Orthopedic Surgery

## 2019-10-10 ENCOUNTER — Ambulatory Visit: Payer: BC Managed Care – PPO | Admitting: Orthopedic Surgery

## 2019-10-11 DIAGNOSIS — Z20828 Contact with and (suspected) exposure to other viral communicable diseases: Secondary | ICD-10-CM | POA: Diagnosis not present

## 2019-10-17 ENCOUNTER — Ambulatory Visit: Payer: Self-pay

## 2019-10-17 ENCOUNTER — Ambulatory Visit (INDEPENDENT_AMBULATORY_CARE_PROVIDER_SITE_OTHER): Payer: BC Managed Care – PPO | Admitting: Orthopedic Surgery

## 2019-10-17 ENCOUNTER — Other Ambulatory Visit: Payer: Self-pay

## 2019-10-17 ENCOUNTER — Encounter: Payer: Self-pay | Admitting: Orthopedic Surgery

## 2019-10-17 DIAGNOSIS — M25521 Pain in right elbow: Secondary | ICD-10-CM

## 2019-10-17 NOTE — Progress Notes (Signed)
Office Visit Note   Patient: Samantha Graves           Date of Birth: 05/06/1971           MRN: [id number] Visit Date: 10/17/2019 Requested by: Fay Records, MD Samantha Graves,  [place] [address] PCP: Fay Records, MD  Subjective: Chief Complaint  Patient presents with  . Right Elbow - Pain    HPI: Samantha Graves is a patient with right elbow pain.  She had a fall 6 weeks ago where she cracked a rib and had an impact on the medial side of her elbow.  Denies any ulnar nerve symptoms.  She currently is applying to nursing school.  She is right-hand dominant.  Has a history of plaque psoriasis but has been off of her Biologics for about 6 months.  Hard for her to extend fully the right elbow.  Localizes pain to that medial epicondyle region.              ROS: All systems reviewed are negative as they relate to the chief complaint within the history of present illness.  Patient denies  fevers or chills.   Assessment & Plan: Visit Diagnoses:  1. Pain in right elbow     Plan: Impression is flexor pronator strain/bruising of the medial epicondyle.  No acute fractures present.  Plan at this time is observation.  Structurally the elbow is functional and intact although she has about 3 degree flexion contracture.  All of her pain is very focal to the medial side of the elbow.  No ulnar nerve subluxation.  I think this should improve over time.  If she is not better by mid January we could consider imaging but as this is likely soft tissue injury I think it should be better by then.  If not we will do MRI scan on the elbow.  Follow-Up Instructions: Return if symptoms worsen or fail to improve.   Orders:  Orders Placed This Encounter  Procedures  . XR Elbow Complete Right (3+View)   No orders of the defined types were placed in this encounter.     Procedures: No procedures performed   Clinical Data: No additional findings.  Objective: Vital Signs: There were no  vitals taken for this visit.  Physical Exam:   Constitutional: Patient appears well-developed HEENT:  Head: Normocephalic Eyes:EOM are normal Neck: Normal range of motion Cardiovascular: Normal rate Pulmonary/chest: Effort normal Neurologic: Patient is alert Skin: Skin is warm Psychiatric: Patient has normal mood and affect    Ortho Exam: Ortho exam demonstrates full pronation supination of the right elbow.  She is lacking about 3 degrees of full extension on the right compared to left.  Strength is good grip wise in both hands.  Mild pain with finger flexion on the right compared to the left.  Very mild pain with wrist flexion on the right compared to the left.  Not much pain with pronation supination.  She is directly tender to palpation over the medial epicondyle as well as the flexor pronator mass tenderness attachment site.  No palpable defect in this region.  She does have pretty good grip strength and form flexion strength.  Specialty Comments:  No specialty comments available.  Imaging: Xr Elbow Complete Right (3+view)  Result Date: 10/17/2019 AP lateral radial head view right elbow reviewed.  No acute fracture or dislocation is present.  Bone quality appears to be intact.  Joint spaces maintained.  Normal  right elbow.  In particular no ossicles or fractures around the medial epicondyle.    PMFS History: Patient Active Problem List   Diagnosis Date Noted  . Psoriasis 01/11/2017  . High risk medication use 01/11/2017  . PSORIATIC ARTHRITIS 10/14/2009  . ALLERGIC RHINITIS 09/06/2007   Past Medical History:  Diagnosis Date  . ADHD   . Bile duct injury 2020   mild  . Cholecystitis   . Elevated LFTs   . PONV (postoperative nausea and vomiting)   . Psoriatic arthritis ([place])   . Rib fracture     Family History  Problem Relation Age of Onset  . Hyperlipidemia Mother   . Ovarian cysts Mother        benign  . Endometriosis Mother   . Liver disease Mother         NASH  . Other Father        auto imune  . Coronary artery disease Father        cardio ablation  . COPD Maternal Grandmother   . Bone cancer Maternal Grandfather 64  . Arthritis/Rheumatoid Sister 62  . Endometriosis Sister   . Retinitis pigmentosa Sister 52  . Ulcerative colitis Sister 21       surgical repair, J pouch  . Ovarian cancer Sister 29       negative genetic testing  . Liver disease Maternal Aunt        NASH  . Colon cancer Neg Hx   . Esophageal cancer Neg Hx   . Pancreatic cancer Neg Hx   . Stomach cancer Neg Hx     Past Surgical History:  Procedure Laterality Date  . CHOLECYSTECTOMY N/A 08/13/2019   Procedure: LAPAROSCOPIC CHOLECYSTECTOMY;  Surgeon: Rolm Bookbinder, MD;  Location: [facility];  Service: General;  Laterality: N/A;  . INTRAUTERINE DEVICE INSERTION  05/30/15, 05/06/10   MIrena IUD  . TONSILLECTOMY AND ADENOIDECTOMY  1990  . WISDOM TOOTH EXTRACTION Bilateral 1991   Social History   Occupational History  . Not on file  Tobacco Use  . Smoking status: Never Smoker  . Smokeless tobacco: Never Used  Substance and Sexual Activity  . Alcohol use: Yes    Alcohol/week: 5.0 - 7.0 standard drinks    Types: 5 - 7 Glasses of wine per week    Comment: none since 06/30/2019 over the last 3 months has up to 3 glasses per week only as of 06/26/19  . Drug use: No  . Sexual activity: Yes    Birth control/protection: I.U.D.

## 2019-10-24 ENCOUNTER — Ambulatory Visit: Payer: BC Managed Care – PPO | Admitting: Orthopedic Surgery

## 2019-10-30 ENCOUNTER — Other Ambulatory Visit (INDEPENDENT_AMBULATORY_CARE_PROVIDER_SITE_OTHER): Payer: BC Managed Care – PPO

## 2019-10-30 DIAGNOSIS — R7989 Other specified abnormal findings of blood chemistry: Secondary | ICD-10-CM

## 2019-10-30 LAB — HEPATIC FUNCTION PANEL
ALT: 30 U/L (ref 0–35)
AST: 46 U/L — ABNORMAL HIGH (ref 0–37)
Albumin: 4.2 g/dL (ref 3.5–5.2)
Alkaline Phosphatase: 47 U/L (ref 39–117)
Bilirubin, Direct: 0.1 mg/dL (ref 0.0–0.3)
Total Bilirubin: 0.7 mg/dL (ref 0.2–1.2)
Total Protein: 6.3 g/dL (ref 6.0–8.3)

## 2019-10-31 ENCOUNTER — Other Ambulatory Visit: Payer: Self-pay

## 2019-10-31 DIAGNOSIS — R7989 Other specified abnormal findings of blood chemistry: Secondary | ICD-10-CM

## 2019-11-08 ENCOUNTER — Ambulatory Visit: Payer: BC Managed Care – PPO | Admitting: Rheumatology

## 2019-11-30 DIAGNOSIS — D472 Monoclonal gammopathy: Secondary | ICD-10-CM

## 2019-11-30 HISTORY — DX: Monoclonal gammopathy: D47.2

## 2019-12-12 NOTE — Progress Notes (Signed)
Virtual Visit via Video Note  I connected with Samantha Graves on 12/13/19 at 10:15 AM EST by a video enabled telemedicine application and verified that I am speaking with the correct person using two identifiers.  Location: Patient: Home  Provider: Clinic  This service was conducted via virtual visit.  Both audio and visual tools were used.  The patient was located at home. I was located in my office.  Consent was obtained prior to the virtual visit and is aware of possible charges through their insurance for this visit.  The patient is an established patient.  Dr. Estanislado Pandy, MD conducted the virtual visit and Hazel Sams, PA-C acted as scribe during the service.  Office staff helped with scheduling follow up visits after the service was conducted.     I discussed the limitations of evaluation and management by telemedicine and the availability of in person appointments. The patient expressed understanding and agreed to proceed.  CC: Right CMC joint pain  History of Present Illness:  Samantha Graves is a 49 y.o. female with history of psoriatic arthritis and trochanteric bursitis.  She was previously off Humira but discontinued in February 2020 due to having the flu and the concern for covid-19.  She states she fell this past fall and fractured a rib and developed discomfort in the right elbow.She was evaluated by Dr. Marlou Sa in November 2020 for right elbow joint pain.   She is currently having right CMC joint pain.  She is having trochanteric bursitis bilaterally.  She has also been experiencing increased pain and stiffness in both feet.   Review of Systems  Constitutional: Positive for malaise/fatigue. Negative for fever.  HENT: Negative for congestion.   Eyes: Positive for redness. Negative for photophobia, pain and discharge.  Respiratory: Negative for cough, shortness of breath and wheezing.   Cardiovascular: Negative for chest pain, palpitations and leg swelling.  Gastrointestinal:  Negative for blood in stool, constipation and diarrhea.  Genitourinary: Negative for dysuria and frequency.  Musculoskeletal: Positive for joint pain. Negative for back pain, myalgias and neck pain.       +Joint stiffness   Skin: Negative for rash.  Neurological: Negative for dizziness, weakness and headaches.  Endo/Heme/Allergies: Does not bruise/bleed easily.  Psychiatric/Behavioral: Negative for depression and memory loss. The patient is not nervous/anxious and does not have insomnia.      Observations/Objective:  Physical Exam  Constitutional: She is oriented to person, place, and time and well-developed, well-nourished, and in no distress.  HENT:  Head: Normocephalic and atraumatic.  Eyes: Conjunctivae are normal.  Pulmonary/Chest: Effort normal.  Neurological: She is alert and oriented to person, place, and time.  Psychiatric: Mood, memory, affect and judgment normal.     Patient reports joint stiffness all day  Patient reports nocturnal pain.  Difficulty dressing/grooming: Reports Difficulty climbing stairs: Denies Difficulty getting out of chair: Reports Difficulty using hands for taps, buttons, cutlery, and/or writing: Reports  Assessment and Plan: Visit Diagnoses: Psoriatic arthritis ([place]) -She has been holding Humira since February 2020 due to concern for COVID-19.  She is apprehensive to restart until she is able to receive the COVID-19 vaccination.   She is currently having pain in right CMC joint and both feet. She has intermittent inflammation in both hands. She is not having any joint swelling at this time. No achilles tendonitis or plantar fasciitis.  She is not having any SI joint pain currently.  She was advised to notify us when she would like to restart  on Humira.  She will need to restart Humira in the office to monitor for a potential reaction.  She will follow up in 4 months.    Psoriasis - Scalp and extensor surface of both elbow joints. She has been using  topical agents.   High risk medication use -   She has been holding Humira since February 2020 due to her concern about COVID-19. Last TB gold negative on 12/06/2018.   Trochanteric bursitis of both hips: She has discomfort related to trochanteric bursitis bilaterally.    Elevated LFTs: She is being followed by Dr. Hilarie Fredrickson. She had a thorough work up and underwent a laparoscopic cholecystectomy in September 2020.  LFTs were WNL on  12/14/19.  She will be having repeat labs in 2 months.   Follow Up Instructions: She will follow up in 4 months.    I discussed the assessment and treatment plan with the patient. The patient was provided an opportunity to ask questions and all were answered. The patient agreed with the plan and demonstrated an understanding of the instructions.   The patient was advised to call back or seek an in-person evaluation if the symptoms worsen or if the condition fails to improve as anticipated.  I provided 25 minutes of non-face-to-face time during this encounter.  Bo Merino, MD    Scribed by-  Hazel Sams, PA-C

## 2019-12-14 ENCOUNTER — Other Ambulatory Visit (INDEPENDENT_AMBULATORY_CARE_PROVIDER_SITE_OTHER): Payer: Self-pay

## 2019-12-14 DIAGNOSIS — R7989 Other specified abnormal findings of blood chemistry: Secondary | ICD-10-CM

## 2019-12-14 LAB — HEPATIC FUNCTION PANEL
ALT: 26 U/L (ref 0–35)
AST: 33 U/L (ref 0–37)
Albumin: 4.7 g/dL (ref 3.5–5.2)
Alkaline Phosphatase: 53 U/L (ref 39–117)
Bilirubin, Direct: 0.1 mg/dL (ref 0.0–0.3)
Total Bilirubin: 0.7 mg/dL (ref 0.2–1.2)
Total Protein: 7.2 g/dL (ref 6.0–8.3)

## 2019-12-17 ENCOUNTER — Other Ambulatory Visit: Payer: Self-pay

## 2019-12-17 ENCOUNTER — Encounter: Payer: Self-pay | Admitting: Rheumatology

## 2019-12-17 ENCOUNTER — Telehealth (INDEPENDENT_AMBULATORY_CARE_PROVIDER_SITE_OTHER): Payer: Self-pay | Admitting: Rheumatology

## 2019-12-17 DIAGNOSIS — M7061 Trochanteric bursitis, right hip: Secondary | ICD-10-CM

## 2019-12-17 DIAGNOSIS — Z79899 Other long term (current) drug therapy: Secondary | ICD-10-CM

## 2019-12-17 DIAGNOSIS — M7062 Trochanteric bursitis, left hip: Secondary | ICD-10-CM

## 2019-12-17 DIAGNOSIS — R7989 Other specified abnormal findings of blood chemistry: Secondary | ICD-10-CM

## 2019-12-17 DIAGNOSIS — L4050 Arthropathic psoriasis, unspecified: Secondary | ICD-10-CM

## 2019-12-17 DIAGNOSIS — L409 Psoriasis, unspecified: Secondary | ICD-10-CM

## 2020-02-04 ENCOUNTER — Other Ambulatory Visit: Payer: Self-pay

## 2020-02-05 ENCOUNTER — Telehealth: Payer: Self-pay | Admitting: Obstetrics and Gynecology

## 2020-02-05 ENCOUNTER — Other Ambulatory Visit (HOSPITAL_COMMUNITY)
Admission: RE | Admit: 2020-02-05 | Discharge: 2020-02-05 | Disposition: A | Discharge status: Home / Self Care | Payer: BC Managed Care – PPO | Source: Ambulatory Visit | Attending: Obstetrics and Gynecology | Admitting: Obstetrics and Gynecology

## 2020-02-05 ENCOUNTER — Ambulatory Visit (INDEPENDENT_AMBULATORY_CARE_PROVIDER_SITE_OTHER): Payer: BC Managed Care – PPO | Admitting: Obstetrics and Gynecology

## 2020-02-05 ENCOUNTER — Encounter: Payer: Self-pay | Admitting: Obstetrics and Gynecology

## 2020-02-05 VITALS — BP 110/68 | HR 70 | Temp 97.0°F | Resp 16 | Ht 67.5 in | Wt 152.0 lb

## 2020-02-05 DIAGNOSIS — R0989 Other specified symptoms and signs involving the circulatory and respiratory systems: Secondary | ICD-10-CM

## 2020-02-05 DIAGNOSIS — Z01419 Encounter for gynecological examination (general) (routine) without abnormal findings: Secondary | ICD-10-CM

## 2020-02-05 DIAGNOSIS — R591 Generalized enlarged lymph nodes: Secondary | ICD-10-CM

## 2020-02-05 NOTE — Telephone Encounter (Signed)
Please schedule dx bilateral mammogram and left axillary [place] for tender left axillary node, 1 cm.   [facility]   She will be traveling in 2 weeks for one week period.

## 2020-02-05 NOTE — Telephone Encounter (Signed)
Spoke with Samantha Graves at [facility]. Patient scheduled for bilateral Dx MMG and left breast US to include axilla, if needed, on 02/26/20 at 9:20am, arrive at 9am.    Call to patient, left detailed message, ok per dpr. Advised of appt as seen above at [facility]. If you need to make any changes to your appt contact [facility] directly at [phone] or return call to office.   Patient placed in MMG hold.   Routing to provider for final review. Patient is agreeable to disposition. Will close encounter.

## 2020-02-05 NOTE — Addendum Note (Signed)
Addended by: Yisroel Ramming, Dietrich Pates E on: 02/05/2020 04:37 PM   Modules accepted: Orders

## 2020-02-05 NOTE — Patient Instructions (Signed)

## 2020-02-05 NOTE — Progress Notes (Addendum)
49 y.o. G43P4004 Married Caucasian female here for annual exam.    Spotting recently once.  Not having cycles.  Some night sweats for the last year and a half. No hot flashes during the day.  Always had vaginal dryness.   Had lots of labs and imaging of her abdomen and liver last year.  She had a laparoscopic cholecystectomy.   Left axillary node is painful for one year.   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   PCP: Dorris Carnes, MD--cardiologist   GI:  Dr. Hilarie Fredrickson Rheumatology:  Dr. Bennie Dallas  No LMP recorded. (Menstrual status: IUD).           Sexually active: Yes.    The current method of family planning is Mirena IUD 05-30-15.    Exercising: Yes.    running Smoker:  no  Health Maintenance: Pap: 07-09-15 Neg:Neg HR HPV, 03-31-12 Neg:Neg HR HP History of abnormal Pap:  no MMG: 06-17-15 3D/Neg/density C/BiRads1. Colonoscopy:  N/a BMD:   n/a  Result  n/a TDaP: 02-25-11 Gardasil:   no HIV: Neg with pregnancy Hep C: 06-19-19 Neg Screening Labs:  Today.    reports that she has never smoked. She has never used smokeless tobacco. She reports current alcohol use of about 3.0 - 4.0 standard drinks of alcohol per week. She reports that she does not use drugs.  Past Medical History:  Diagnosis Date  . ADHD   . Bile duct injury 2020   mild  . Cholecystitis   . Elevated LFTs   . PONV (postoperative nausea and vomiting)   . Psoriatic arthritis ([place])   . Rib fracture     Past Surgical History:  Procedure Laterality Date  . CHOLECYSTECTOMY N/A 08/13/2019   Procedure: LAPAROSCOPIC CHOLECYSTECTOMY;  Surgeon: Rolm Bookbinder, MD;  Location: [place];  Service: General;  Laterality: N/A;  . INTRAUTERINE DEVICE INSERTION  05/30/15, 05/06/10   MIrena IUD  . TONSILLECTOMY AND ADENOIDECTOMY  1990  . WISDOM TOOTH EXTRACTION Bilateral 1991    Current Outpatient Medications  Medication Sig Dispense Refill  . Cholecalciferol (VITAMIN D3) 125 MCG (5000 UT) CAPS Take 1 capsule by  mouth daily.    Marland Kitchen EVEKEO 10 MG TABS Take 10 mg by mouth 2 (two) times daily.   0  . L-THEANINE PO Take by mouth. Takes 1 tablet daily    . levonorgestrel (MIRENA) 20 MCG/24HR IUD 1 each by Intrauterine route once. Mirena IUD.     No current facility-administered medications for this visit.    Family History  Problem Relation Age of Onset  . Hyperlipidemia Mother   . Ovarian cysts Mother        benign  . Endometriosis Mother   . Liver disease Mother        NASH  . Other Father        auto imune  . Coronary artery disease Father        cardio ablation  . COPD Maternal Grandmother   . Bone cancer Maternal Grandfather 5  . Arthritis/Rheumatoid Sister 80  . Endometriosis Sister   . Retinitis pigmentosa Sister 37  . Ulcerative colitis Sister 21       surgical repair, J pouch  . Ovarian cancer Sister 53       negative genetic testing  . Liver disease Maternal Aunt        NASH  . Colon cancer Neg Hx   . Esophageal cancer Neg Hx   . Pancreatic cancer Neg  Hx   . Stomach cancer Neg Hx     Review of Systems  All other systems reviewed and are negative.   Exam:   Temp (!) 97 F (36.1 C) (Temporal)   Ht 5' 7.5" (1.715 m)   Wt 152 lb (68.9 kg)   BMI 23.46 kg/m     General appearance: alert, cooperative and appears stated age Head: normocephalic, without obvious abnormality, atraumatic Neck: no adenopathy, supple, symmetrical, trachea midline and thyroid normal to inspection and palpation Lungs: clear to auscultation bilaterally Breasts: normal appearance, no masses or tenderness, No nipple retraction or dimpling, No nipple discharge or bleeding, No axillary adenopathy on right.  1 cm tender left axillary node.  Heart: regular rate and rhythm Abdomen: soft, non-tender; no masses, no organomegaly Extremities: extremities normal, atraumatic, no cyanosis or edema Skin: skin color, texture, turgor normal. No rashes or lesions Lymph nodes: cervical, supraclavicular, and axillary  nodes normal. Neurologic: grossly normal  Pelvic: External genitalia:  no lesions              No abnormal inguinal nodes palpated.              Urethra:  normal appearing urethra with no masses, tenderness or lesions              Bartholins and Skenes: normal                 Vagina: normal appearing vagina with normal color and discharge, no lesions              Cervix: no lesions.  IUD strings noted.              Pap taken: Yes.   Bimanual Exam:  Uterus:  normal size, contour, position, consistency, mobility, non-tender              Adnexa: no mass, fullness, tenderness              Rectal exam: Yes.  .  Confirms.              Anus:  normal sphincter tone, no lesions  Chaperone was present for exam.  Assessment:   Well woman visit with normal exam. FH ovarian cancer in sister.  Mirena IUD.  Tender left axillary lymph node.   Plan: Dx bilateral mammogram and left breast US at [facility]. Self breast awareness reviewed. Pap and HR HPV as above. Guidelines for Calcium, Vitamin D, regular exercise program including cardiovascular and weight bearing exercise. Vit d and cholesterol check.  We dicussed 6 year use of Mirena IUD.  Will check [place] and E2 next year prior to removal of this IUD.  She will call if she starts cycling and wants an IUD exchange sooner.  Follow up annually and prn.   After visit summary provided.

## 2020-02-06 LAB — CYTOLOGY - PAP
Comment: NEGATIVE
Diagnosis: NEGATIVE
High risk HPV: NEGATIVE

## 2020-02-06 LAB — VITAMIN D 25 HYDROXY (VIT D DEFICIENCY, FRACTURES): Vit D, 25-Hydroxy: 62.5 ng/mL (ref 30.0–100.0)

## 2020-02-06 LAB — LIPID PANEL
Chol/HDL Ratio: 2.5 ratio (ref 0.0–4.4)
Cholesterol, Total: 207 mg/dL — ABNORMAL HIGH (ref 100–199)
HDL: 83 mg/dL (ref >39)
LDL Chol Calc (NIH): 105 mg/dL — ABNORMAL HIGH (ref 0–99)
Triglycerides: 107 mg/dL (ref 0–149)
VLDL Cholesterol Cal: 19 mg/dL (ref 5–40)

## 2020-02-26 ENCOUNTER — Other Ambulatory Visit: Payer: Self-pay

## 2020-02-26 ENCOUNTER — Ambulatory Visit
Admission: RE | Admit: 2020-02-26 | Discharge: 2020-02-26 | Disposition: A | Discharge status: Home / Self Care | Payer: BC Managed Care – PPO | Source: Ambulatory Visit | Attending: Obstetrics and Gynecology | Admitting: Obstetrics and Gynecology

## 2020-02-26 ENCOUNTER — Other Ambulatory Visit: Payer: Self-pay | Admitting: Obstetrics and Gynecology

## 2020-02-26 DIAGNOSIS — R0989 Other specified symptoms and signs involving the circulatory and respiratory systems: Secondary | ICD-10-CM

## 2020-02-26 DIAGNOSIS — R591 Generalized enlarged lymph nodes: Secondary | ICD-10-CM

## 2020-02-26 DIAGNOSIS — N6489 Other specified disorders of breast: Secondary | ICD-10-CM | POA: Diagnosis not present

## 2020-02-26 DIAGNOSIS — R922 Inconclusive mammogram: Secondary | ICD-10-CM | POA: Diagnosis not present

## 2020-02-28 ENCOUNTER — Other Ambulatory Visit (INDEPENDENT_AMBULATORY_CARE_PROVIDER_SITE_OTHER): Payer: BC Managed Care – PPO

## 2020-02-28 DIAGNOSIS — R748 Abnormal levels of other serum enzymes: Secondary | ICD-10-CM | POA: Diagnosis not present

## 2020-02-28 DIAGNOSIS — R591 Generalized enlarged lymph nodes: Secondary | ICD-10-CM

## 2020-02-28 DIAGNOSIS — R0989 Other specified symptoms and signs involving the circulatory and respiratory systems: Secondary | ICD-10-CM

## 2020-02-28 LAB — HEPATIC FUNCTION PANEL
ALT: 14 U/L (ref 0–35)
AST: 21 U/L (ref 0–37)
Albumin: 4.3 g/dL (ref 3.5–5.2)
Alkaline Phosphatase: 48 U/L (ref 39–117)
Bilirubin, Direct: 0.1 mg/dL (ref 0.0–0.3)
Total Bilirubin: 0.5 mg/dL (ref 0.2–1.2)
Total Protein: 6.7 g/dL (ref 6.0–8.3)

## 2020-03-03 ENCOUNTER — Other Ambulatory Visit: Payer: Self-pay

## 2020-03-03 DIAGNOSIS — R7989 Other specified abnormal findings of blood chemistry: Secondary | ICD-10-CM

## 2020-04-07 DIAGNOSIS — J302 Other seasonal allergic rhinitis: Secondary | ICD-10-CM | POA: Diagnosis not present

## 2020-04-07 DIAGNOSIS — J343 Hypertrophy of nasal turbinates: Secondary | ICD-10-CM | POA: Diagnosis not present

## 2020-04-07 DIAGNOSIS — J342 Deviated nasal septum: Secondary | ICD-10-CM | POA: Diagnosis not present

## 2020-04-07 DIAGNOSIS — H6983 Other specified disorders of Eustachian tube, bilateral: Secondary | ICD-10-CM | POA: Diagnosis not present

## 2020-04-08 NOTE — Progress Notes (Signed)
Office Visit Note  Patient: Samantha Graves             Date of Birth: 10-22-1971           MRN: [id number]             PCP: Fay Records, MD Referring: Fay Records, MD Visit Date: 04/15/2020 Occupation: @GUAROCC @  Subjective:  Pain in multiple joints   History of Present Illness: Samantha Graves is a 49 y.o. female with history of psoriatic arthritis.  Patient has been holding Humira since February 2020 due to the concern for COVID-19.  She has not had any recent infections and has received both COVID-19 vaccinations.  She has been experiencing increased pain and intermittent inflammation in multiple joints over the past several months.  She is currently having pain and inflammation in both hands.  She has also noticed increased stiffness in both feet especially in the morning.  She has SI joint pain and stiffness, which is worse in the morning.  She remains active and has been running as well as strength training for exercise.  She continues to have trochanter bursitis bilaterally and uses a roller and perform stretching exercises on a daily basis.  She denies any Achilles tendinitis or plantar fasciitis.  She has noticed her psoriasis worsening over the past several months.  She currently has psoriasis on her scalp in both ears.   Activities of Daily Living:  Patient reports morning stiffness for 2 hours.   Patient Reports nocturnal pain.  Difficulty dressing/grooming: Reports Difficulty climbing stairs: Denies Difficulty getting out of chair: Reports Difficulty using hands for taps, buttons, cutlery, and/or writing: Reports  Review of Systems  Constitutional: Positive for fatigue.  HENT: Positive for mouth dryness. Negative for mouth sores and nose dryness.   Eyes: Positive for dryness. Negative for pain and visual disturbance.  Respiratory: Negative for cough, hemoptysis, shortness of breath and difficulty breathing.   Cardiovascular: Negative for chest pain,  palpitations, hypertension and swelling in legs/feet.  Gastrointestinal: Negative for blood in stool, constipation and diarrhea.  Endocrine: Negative for increased urination.  Genitourinary: Negative for difficulty urinating and painful urination.  Musculoskeletal: Positive for arthralgias, joint pain, joint swelling, morning stiffness and muscle tenderness. Negative for myalgias, muscle weakness and myalgias.  Skin: Positive for rash (Psoriasis ). Negative for color change, pallor, hair loss, nodules/bumps, skin tightness, ulcers and sensitivity to sunlight.  Allergic/Immunologic: Negative for susceptible to infections.  Neurological: Negative for dizziness, numbness and headaches.  Hematological: Negative for bruising/bleeding tendency and swollen glands.  Psychiatric/Behavioral: Positive for sleep disturbance. Negative for depressed mood. The patient is not nervous/anxious.     PMFS History:  Patient Active Problem List   Diagnosis Date Noted  . Psoriasis 01/11/2017  . High risk medication use 01/11/2017  . PSORIATIC ARTHRITIS 10/14/2009  . ALLERGIC RHINITIS 09/06/2007    Past Medical History:  Diagnosis Date  . ADHD   . Bile duct injury 2020   mild  . Cholecystitis   . Elevated LFTs   . PONV (postoperative nausea and vomiting)   . Psoriatic arthritis ([place])   . Rib fracture     Family History  Problem Relation Age of Onset  . Hyperlipidemia Mother   . Ovarian cysts Mother        benign  . Endometriosis Mother   . Liver disease Mother        NASH  . Other Father        auto  imune  . Coronary artery disease Father        cardio ablation  . COPD Maternal Grandmother   . Bone cancer Maternal Grandfather 4  . Arthritis/Rheumatoid Sister 84  . Endometriosis Sister   . Retinitis pigmentosa Sister 67  . Ulcerative colitis Sister 21       surgical repair, J pouch  . Ovarian cancer Sister 80       negative genetic testing  . Liver disease Maternal Aunt        NASH  .  Colon cancer Neg Hx   . Esophageal cancer Neg Hx   . Pancreatic cancer Neg Hx   . Stomach cancer Neg Hx    Past Surgical History:  Procedure Laterality Date  . CHOLECYSTECTOMY N/A 08/13/2019   Procedure: LAPAROSCOPIC CHOLECYSTECTOMY;  Surgeon: Rolm Bookbinder, MD;  Location: [facility];  Service: General;  Laterality: N/A;  . INTRAUTERINE DEVICE INSERTION  05/30/15, 05/06/10   MIrena IUD  . TONSILLECTOMY AND ADENOIDECTOMY  1990  . WISDOM TOOTH EXTRACTION Bilateral 1991   Social History   Social History Narrative  . Not on file   Immunization History  Administered Date(s) Administered  . Influenza Whole 08/30/2003  . Influenza,inj,Quad PF,6+ Mos 09/01/2015, 09/14/2019  . Tdap 02/25/2011     Objective: Vital Signs: BP 107/70 (BP Location: Left Arm, Patient Position: Sitting, Cuff Size: Normal)   Pulse 72   Resp 14   Ht 5\' 7"  (1.702 m)   Wt 151 lb 12.8 oz (68.9 kg)   BMI 23.78 kg/m    Physical Exam Vitals and nursing note reviewed.  Constitutional:      Appearance: She is well-developed.  HENT:     Head: Normocephalic and atraumatic.  Eyes:     Conjunctiva/sclera: Conjunctivae normal.  Pulmonary:     Effort: Pulmonary effort is normal.  Abdominal:     General: Bowel sounds are normal.     Palpations: Abdomen is soft.  Musculoskeletal:     Cervical back: Normal range of motion.  Lymphadenopathy:     Cervical: No cervical adenopathy.  Skin:    General: Skin is warm and dry.     Capillary Refill: Capillary refill takes less than 2 seconds.  Neurological:     Mental Status: She is alert and oriented to person, place, and time.  Psychiatric:        Behavior: Behavior normal.      Musculoskeletal Exam: C-spine, thoracic spine, and lumbar spine good ROM.  No midline spinal tenderness.  Tenderness over both SI joints.  Shoulder joints, elbow joints, and wrist joints good ROM with no discomfort.  Tenderness of the right 1st and 5th MCP joints.  Tenderness and synovitis of  the left 2nd and 5th MCP joints.  Hip joints good ROM with no discomfort.  Tenderness over trochanteric bursa bilaterally.  Knee joints and ankle joints good ROM with no discomfort.  No warmth or effusion of knee joints.  No tenderness or swelling of ankle joints.   CDAI Exam: CDAI Score: -- Patient Global: --; Provider Global: -- Swollen: 2 ; Tender: 6  Joint Exam 04/15/2020      Right  Left  MCP 1   Tender     MCP 2     Swollen Tender  MCP 5   Tender  Swollen Tender  Sacroiliac   Tender   Tender     Investigation: No additional findings.  Imaging: No results found.  Recent Labs: Lab Results  Component Value  Date   WBC 4.3 08/13/2019   HGB 13.5 08/13/2019   PLT 194 08/13/2019   NA 138 08/13/2019   K 3.8 08/13/2019   CL 104 08/13/2019   CO2 24 08/13/2019   GLUCOSE 90 08/13/2019   BUN 14 08/13/2019   CREATININE 0.82 08/13/2019   BILITOT 0.5 02/28/2020   ALKPHOS 48 02/28/2020   AST 21 02/28/2020   ALT 14 02/28/2020   PROT 6.7 02/28/2020   ALBUMIN 4.3 02/28/2020   CALCIUM 8.8 (L) 08/13/2019   GFRAA >60 08/13/2019   QFTBGOLD NEGATIVE 11/04/2017   QFTBGOLDPLUS NEGATIVE 12/06/2018    Speciality Comments: No specialty comments available.  Procedures:  No procedures performed Allergies: Tamiflu [oseltamivir phosphate]   Assessment / Plan:     Visit Diagnoses: Psoriatic arthritis ([place]): She has tenderness and synovitis of the left 2nd and 5th MCPs and right 1st and 5th MCP joints.  She has complete fist formation bilaterally. She has been experiencing increased stiffness and tenderness in both hands over the past several months.  She has also noticed increased pain and stiffness in both SI joints, especially in the morning.  No achilles tendonitis or plantar fasciitis.  Knee joints have good ROM with no warmth or effusion on exam.  Her psoriasis on her scalp and both ears has been worsening.  She discontinued Humira in February 2020 due to the concern for covid-19.  She  has started having increased pain and inflammation for the past several months.  She remains active running and strength training, but she has noticed increased joint pain in the evenings and more stiffness in the morning.  She previously had an inadequate response to Enbrel and does not feel that Humira was ever very effective for her.  Different treatment options were discussed.  She has a family history of IBD, so she will not be a good candidate for IL-17 inhibitors.  We discussed the indications, contraindications, and potential side effects of Simponi 50 mg sq injections every 28 days.  All questions were addressed and consent was obtained today.  We will apply for simponi through her insurance and once approved she will return to the office for administration of the first injection.  She will follow up in 6 weeks to assess her response.  Psoriasis: She has noticed worsening psoriasis on her scalp and both ears.    Medication counseling:  Baseline Immunosuppressant Therapy Labs TB GOLD Quantiferon TB Gold Latest Ref Rng & Units 12/06/2018  Quantiferon TB Gold Plus NEGATIVE NEGATIVE   Hepatitis Panel Hepatitis Latest Ref Rng & Units 06/29/2019  Hep B Surface Ag NON-REACTI NON-REACTIVE  Hep C Ab NON-REACTI NON-REACTIVE  Hep C Ab NON-REACTI NON-REACTIVE   HIV No results found for: HIV Immunoglobulins Immunoglobulin Electrophoresis Latest Ref Rng & Units 06/29/2019  IgG 600 - 1,640 mg/dL 1,082   SPEP Serum Protein Electrophoresis Latest Ref Rng & Units 02/28/2020  Total Protein 6.0 - 8.3 g/dL 6.7   G6PD No results found for: G6PDH TPMT No results found for: TPMT   Chest x-ray: No active cardiopulmonary disease on 07/27/19.   Does patient have diagnosis of heart failure?  No  Counseled patient that Simponi is a TNF blocking agent.  Reviewed Simponi dose of 50 mg once a month.  Counseled patient on purpose, proper use, and adverse effects of Simponi.  Reviewed the most common adverse  effects including infections and injection site reactions.  Discussed that there is the possibility of an increased risk of malignancy but it  is not well understood if this increased risk is due to the medication or the disease state.  Advised patient to get yearly dermatology exams due to risk of skin cancer.    Counseled patient that Simponi should be held for infections and prior to scheduled surgery.  Counseled patient to avoid live vaccines while on Simponi.  Advised patient to get annual influenza vaccine and the pneumococcal vaccine as indicated.    Reviewed the importance of regular labs while on Simponi therapy. Standing orders placed. Provided patient with medication education material and answered all questions.  Patient voiced understanding.  Patient consented to Simponi.  Will upload consent into the media tab.  Reviewed storage instructions for Simponi.  Advised initial injection must be administered in office.  Patient verbalized understanding.  Will apply for Simponi through patient's insurance and will update when we receive a response.  Patient dose will be Simponi 50 mg every 28 days.  Prescription pending lab results and/or insurance approval.   High risk medication use - Inadequate response to Enbrel and Humira.  D/c Humira in February 2020 due to the concern for covid-19.  We will apply for Simponi 50 mg sq injections every 28 days.  Baseline immunosuppressive lab work was obtained today. She will return for lab work 1 month after starting on Simponi then every 3 months.   - Plan: HIV Antibody (routine testing w rflx), QuantiFERON-TB Gold Plus, Serum protein electrophoresis with reflex, IgG, IgA, IgM, COMPLETE METABOLIC PANEL WITH GFR, CBC with Differential/Platelet  Chronic SI joint pain: She has tenderness over both SI joints today.  She has been experiencing increased pain and stiffness in both SI joints, especially in the morning.  She has been performing stretching exercises  daily.   Trochanteric bursitis of both hips: She has tenderness over bilateral trochanteric bursa upon palpation.  She has been performing stretching exercises and using a foam roller.  She has difficulty laying on her sides at night due to the discomfort.   Elevated LFTs: LFTs WNL on 02/28/20.  CMP was ordered today.     Orders: Orders Placed This Encounter  Procedures  . HIV Antibody (routine testing w rflx)  . QuantiFERON-TB Gold Plus  . Serum protein electrophoresis with reflex  . IgG, IgA, IgM  . COMPLETE METABOLIC PANEL WITH GFR  . CBC with Differential/Platelet   No orders of the defined types were placed in this encounter.   Face-to-face time spent with patient was 30  minutes. Greater than 50% of time was spent in counseling and coordination of care  Follow-Up Instructions: Return in about 6 weeks (around 05/27/2020) for Psoriatic arthritis.   Ofilia Neas, PA-C  Note - This record has been created using Dragon software.  Chart creation errors have been sought, but may not always  have been located. Such creation errors do not reflect on  the standard of medical care.

## 2020-04-15 ENCOUNTER — Other Ambulatory Visit: Payer: Self-pay

## 2020-04-15 ENCOUNTER — Encounter: Payer: Self-pay | Admitting: Physician Assistant

## 2020-04-15 ENCOUNTER — Telehealth: Payer: Self-pay

## 2020-04-15 ENCOUNTER — Ambulatory Visit (INDEPENDENT_AMBULATORY_CARE_PROVIDER_SITE_OTHER): Payer: BC Managed Care – PPO | Admitting: Physician Assistant

## 2020-04-15 VITALS — BP 107/70 | HR 72 | Resp 14 | Ht 67.0 in | Wt 151.8 lb

## 2020-04-15 DIAGNOSIS — M7061 Trochanteric bursitis, right hip: Secondary | ICD-10-CM

## 2020-04-15 DIAGNOSIS — G8929 Other chronic pain: Secondary | ICD-10-CM

## 2020-04-15 DIAGNOSIS — M7062 Trochanteric bursitis, left hip: Secondary | ICD-10-CM

## 2020-04-15 DIAGNOSIS — M533 Sacrococcygeal disorders, not elsewhere classified: Secondary | ICD-10-CM

## 2020-04-15 DIAGNOSIS — Z79899 Other long term (current) drug therapy: Secondary | ICD-10-CM

## 2020-04-15 DIAGNOSIS — R7989 Other specified abnormal findings of blood chemistry: Secondary | ICD-10-CM

## 2020-04-15 DIAGNOSIS — Z111 Encounter for screening for respiratory tuberculosis: Secondary | ICD-10-CM | POA: Diagnosis not present

## 2020-04-15 DIAGNOSIS — L409 Psoriasis, unspecified: Secondary | ICD-10-CM | POA: Diagnosis not present

## 2020-04-15 DIAGNOSIS — L4050 Arthropathic psoriasis, unspecified: Secondary | ICD-10-CM

## 2020-04-15 NOTE — Patient Instructions (Signed)
Standing Labs We placed an order today for your standing lab work.    Please come back and get your standing labs in 1 month then every 3 months   We have open lab daily Monday through Thursday from 8:30-12:30 PM and 1:30-4:30 PM and Friday from 8:30-12:30 PM and 1:30-4:00 PM at the office of Dr. Bo Merino.   You may experience shorter wait times on Monday and Friday afternoons. The office is located at [address], [place], [place], [place] [address] No appointment is necessary.   Labs are drawn by [facility].  You may receive a bill from [facility] for your lab work.  If you wish to have your labs drawn at another location, please call the office 24 hours in advance to send orders.  If you have any questions regarding directions or hours of operation,  please call [phone].   Just as a reminder please drink plenty of water prior to coming for your lab work. Thanks!    Golimumab injection (subcutaneous or intravenous use) What is this medicine? GOLIMUMAB (goe LIM ue mab) is used to treat rheumatoid arthritis, psoriatic arthritis, polyarticular juvenile idiopathic arthritis, and ankylosing spondylitis. It is also used to treat ulcerative colitis. This medicine may be used for other purposes; ask your health care provider or pharmacist if you have questions. COMMON BRAND NAME(S): Simponi, SIMPONI ARIA What should I tell my health care provider before I take this medicine? They need to know if you have any of these conditions:  cancer  diabetes  Guillain-Barre syndrome  heart failure  hepatitis B or history of hepatitis B infection  immune system problems  infection or history of infections  low blood counts like low white cell, platelet, or red cell counts  multiple sclerosis  psoriasis  recently received or scheduled to receive a vaccine  tuberculosis, a positive skin test for tuberculosis or have recently been in close contact with someone who has  tuberculosis  an unusual reaction to golimumab, other medicines, latex, rubber, foods, dyes, or preservatives  pregnant or trying to get pregnant  breast-feeding How should I use this medicine? This medicine is for infusion into a vein or for injection under the skin. Infusions are given by a health care professional in a hospital or clinic setting. If you are to give your own medicine at home, you will be taught how to prepare and give this medicine under the skin. Use exactly as directed. Take your medicine at regular intervals. Do not take your medicine more often than directed. It is important that you put your used injectors, needles and syringes in a special sharps container. Do not put them in a trash can. If you do not have a sharps container, call your pharmacist or healthcare provider to get one. A special MedGuide will be given to you by the pharmacist with each prescription and refill. Be sure to read this information carefully each time. Talk to your pediatrician regarding the use of this medicine in children. While this drug may be prescribed for children as young as 2 years for selected conditions, precautions do apply. Overdosage: If you think you have taken too much of this medicine contact a poison control center or emergency room at once. NOTE: This medicine is only for you. Do not share this medicine with others. What if I miss a dose? If you give your medicine by injection under the skin: If you miss a dose, take it as soon as you can. If it is  almost time for your next dose, take only that dose. Do not take double or extra doses. Call your doctor or health care professional if you are not sure how to handle a missed dose. If you are to be given an infusion: It is important not to miss your dose. Call your doctor or health care professional if you are unable to keep an appointment. What may interact with this medicine? Do not take this medicine with any of the following  medications:  abatacept  adalimumab  anakinra  certolizumab  etanercept  infliximab  live virus vaccines  rituximab  rilonacept  tocilizumab This medicine may also interact with the following medications:  cyclosporine  theophylline  vaccines  warfarin This list may not describe all possible interactions. Give your health care provider a list of all the medicines, herbs, non-prescription drugs, or dietary supplements you use. Also tell them if you smoke, drink alcohol, or use illegal drugs. Some items may interact with your medicine. What should I watch for while using this medicine? Visit your doctor or health care professional for regular checks on your progress. Tell your doctor or healthcare professional if your symptoms do not start to get better or if they get worse. You will be tested for tuberculosis (TB) before you start this medicine. If your doctor prescribes any medicine for TB, you should start taking the TB medicine before starting this medicine. Make sure to finish the full course of TB medicine. Call your doctor or health care professional if you get a cold or other infection while receiving this medicine. Do not treat yourself. This medicine may decrease your body's ability to fight infection. Talk to your doctor about your risk of cancer. You may be more at risk for certain types of cancers if you take this medicine. What side effects may I notice from receiving this medicine? Side effects that you should report to your doctor or health care professional as soon as possible:  allergic reactions like skin rash, itching or hives, swelling of the face, lips, or tongue  breathing problems  changes in vision  chest pain  joint or muscle pain  mouth sores  numbness or tingling in any part of your body  red, scaly patches or raised bumps on the skin  signs and symptoms of infection like fever or chills; cough; sore throat; pain or trouble passing  urine  signs and symptoms of liver injury like dark yellow or brown urine; general ill feeling or flu-like symptoms; light-colored stools; loss of appetite; nausea; right upper belly pain; unusually weak or tired; yellowing of the eyes or skin  swelling of the legs or ankles  swollen lymph nodes in the neck, underarm, or groin areas  unexplained weight loss  unusual bleeding or bruising  unusually weak or tired Side effects that usually do not require medical attention (report to your doctor or health care professional if they continue or are bothersome):  dizziness  increased blood pressure  loss of appetite  nausea, vomiting  redness, itching, swelling, or bruising at site where injected  runny nose This list may not describe all possible side effects. Call your doctor for medical advice about side effects. You may report side effects to FDA at 1-800-FDA-1088. Where should I keep my medicine? Infusions will be given in a hospital or clinic and will not be stored at home. Storage for syringes or injectors given under the skin and stored at home: Keep out of the reach of children. Store  in a refrigerator between 2 and 8 degrees C (36 and 46 degrees F). If needed, the syringes or injectors may be stored at room temperature up to 25 degrees C (77 degrees F) for up to 30 days. Do not put the medicine back in the refrigerator after it reaches room temperature. Keep this medicine in the original container. Protect from light. Do not freeze. Do not shake. Throw away any medicine that has been kept at room temperature for 30 days and has not been used. Throw away any unused medicine after the expiration date. NOTE: This sheet is a summary. It may not cover all possible information. If you have questions about this medicine, talk to your doctor, pharmacist, or health care provider.  2020 Elsevier/Gold Standard (2019-08-30 13:33:40)

## 2020-04-15 NOTE — Telephone Encounter (Signed)
Please apply for simponi subcutaneous injections, per Hazel Sams, PA-C. Thanks!

## 2020-04-16 NOTE — Telephone Encounter (Signed)
Submitted a Prior Authorization request to [facility] for [facility] via Cover My Meds. Will update once we receive a response.  ([id number])

## 2020-04-16 NOTE — Progress Notes (Signed)
CBC and CMP WNL.  Immunoglobulins WNL.

## 2020-04-17 ENCOUNTER — Telehealth: Payer: Self-pay | Admitting: Rheumatology

## 2020-04-17 NOTE — Telephone Encounter (Signed)
Called to complete PA and was advised a form was faxed to office which was completed and faxed back to them with chart notes. Nothing further is needed.

## 2020-04-17 NOTE — Telephone Encounter (Signed)
Received fax from [facility] requesting additional information for prior authorization.  Completed form and faxed back to [facility].  We will update when we receive a response.  Fax# [phone]   Mariella Saa, PharmD, [place], [place] Clinical Specialty Pharmacist [phone]  04/17/2020 9:13 AM

## 2020-04-17 NOTE — Telephone Encounter (Signed)
Shelda Pal from [facility] left a voicemail in regards to a prior authorization for patient's prescription of Simponi 50 mg injection.  We need to know if this is going to be self-injected or administered by a health care professional   Please call [phone]  Key [id number]

## 2020-04-18 ENCOUNTER — Other Ambulatory Visit: Payer: Self-pay

## 2020-04-18 DIAGNOSIS — R899 Unspecified abnormal finding in specimens from other organs, systems and tissues: Secondary | ICD-10-CM

## 2020-04-18 LAB — COMPLETE METABOLIC PANEL WITH GFR
AG Ratio: 1.9 (calc) (ref 1.0–2.5)
ALT: 18 U/L (ref 6–29)
AST: 27 U/L (ref 10–35)
Albumin: 4.4 g/dL (ref 3.6–5.1)
Alkaline phosphatase (APISO): 52 U/L (ref 31–125)
BUN: 13 mg/dL (ref 7–25)
CO2: 29 mmol/L (ref 20–32)
Calcium: 9.7 mg/dL (ref 8.6–10.2)
Chloride: 100 mmol/L (ref 98–110)
Creat: 0.92 mg/dL (ref 0.50–1.10)
GFR, Est African American: 85 mL/min/{1.73_m2} (ref >=60)
GFR, Est Non African American: 74 mL/min/{1.73_m2} (ref >=60)
Globulin: 2.3 g/dL (calc) (ref 1.9–3.7)
Glucose, Bld: 93 mg/dL (ref 65–99)
Potassium: 4 mmol/L (ref 3.5–5.3)
Sodium: 137 mmol/L (ref 135–146)
Total Bilirubin: 0.7 mg/dL (ref 0.2–1.2)
Total Protein: 6.7 g/dL (ref 6.1–8.1)

## 2020-04-18 LAB — CBC WITH DIFFERENTIAL/PLATELET
Absolute Monocytes: 372 cells/uL (ref 200–950)
Basophils Absolute: 30 cells/uL (ref 0–200)
Basophils Relative: 0.5 %
Eosinophils Absolute: 72 cells/uL (ref 15–500)
Eosinophils Relative: 1.2 %
HCT: 44.2 % (ref 35.0–45.0)
Hemoglobin: 14.5 g/dL (ref 11.7–15.5)
Lymphs Abs: 1590 cells/uL (ref 850–3900)
MCH: 31.4 pg (ref 27.0–33.0)
MCHC: 32.8 g/dL (ref 32.0–36.0)
MCV: 95.7 fL (ref 80.0–100.0)
MPV: 11.3 fL (ref 7.5–12.5)
Monocytes Relative: 6.2 %
Neutro Abs: 3936 cells/uL (ref 1500–7800)
Neutrophils Relative %: 65.6 %
Platelets: 195 10*3/uL (ref 140–400)
RBC: 4.62 10*6/uL (ref 3.80–5.10)
RDW: 11.8 % (ref 11.0–15.0)
Total Lymphocyte: 26.5 %
WBC: 6 10*3/uL (ref 3.8–10.8)

## 2020-04-18 LAB — PROTEIN ELECTROPHORESIS, SERUM, WITH REFLEX
Abnormal Protein Band1: 0.2 g/dL — ABNORMAL HIGH
Albumin ELP: 4.6 g/dL (ref 3.8–4.8)
Alpha 1: 0.3 g/dL (ref 0.2–0.3)
Alpha 2: 0.6 g/dL (ref 0.5–0.9)
Beta 2: 0.3 g/dL (ref 0.2–0.5)
Beta Globulin: 0.4 g/dL (ref 0.4–0.6)
Gamma Globulin: 0.9 g/dL (ref 0.8–1.7)
Total Protein: 7.1 g/dL (ref 6.1–8.1)

## 2020-04-18 LAB — HIV ANTIBODY (ROUTINE TESTING W REFLEX): HIV 1&2 Ab, 4th Generation: NONREACTIVE

## 2020-04-18 LAB — QUANTIFERON-TB GOLD PLUS
Mitogen-NIL: >10 IU/mL
NIL: 0.02 IU/mL
QuantiFERON-TB Gold Plus: NEGATIVE
TB1-NIL: 0 IU/mL
TB2-NIL: 0 IU/mL

## 2020-04-18 LAB — IGG, IGA, IGM
IgG (Immunoglobin G), Serum: 1018 mg/dL (ref 600–1640)
IgM, Serum: 211 mg/dL (ref 50–300)
Immunoglobulin A: 172 mg/dL (ref 47–310)

## 2020-04-18 LAB — IFE INTERPRETATION

## 2020-04-18 NOTE — Progress Notes (Signed)
HIV negative.  TB gold negative. Abnormal IFE. Please notify patient and refer her to hematology for further evaluation.

## 2020-04-21 ENCOUNTER — Telehealth: Payer: Self-pay | Admitting: Hematology and Oncology

## 2020-04-21 NOTE — Telephone Encounter (Signed)
Received a new hem referral from [facility] for abnl lab test. Samantha Graves has been cld and scheduled to see dr. Lorenso Courier on 6/4 at 9am. Pt aware to arrive 15 minutes early.

## 2020-04-21 NOTE — Telephone Encounter (Signed)
Received notification from [facility] regarding a prior authorization for [facility]. Authorization has been APPROVED from 04/16/2020 to 04/15/2020.   Authorization # [id number]  Mariella Saa, PharmD, BCACP, CPP Clinical Specialty Pharmacist (Rheumatology and Pulmonology)  04/21/2020 8:28 AM

## 2020-04-21 NOTE — Telephone Encounter (Signed)
Ran test claim, patient's copay for 1 month supply is $0.00. No pharmacy restrictions. Patient can use a copay card.

## 2020-04-22 NOTE — Telephone Encounter (Signed)
Okay to proceed with Simponi.

## 2020-04-22 NOTE — Telephone Encounter (Signed)
Call patient to notify of approval.  Patient scheduled for new start appointment on 5/26 at 10 AM.  Patient stated that she recently had some abnormal lab work and was referred to hematology and wants to know if it is okay for her to proceed with Simponi.  Advised that I was not notified that it would be an issue.  Will inquire with Dr. Estanislado Pandy and call the patient to reschedule if she need to wait until her hematology appointment.   Mariella Saa, PharmD, [place], CPP Clinical Specialty Pharmacist (Rheumatology and Pulmonology)  04/22/2020 11:25 AM

## 2020-04-23 ENCOUNTER — Other Ambulatory Visit: Payer: Self-pay

## 2020-04-23 ENCOUNTER — Ambulatory Visit: Payer: BC Managed Care – PPO | Admitting: Pharmacist

## 2020-04-23 VITALS — BP 118/74 | HR 64

## 2020-04-23 NOTE — Progress Notes (Signed)
Patient not aware of 30 min monitoring policy after biologic administration and had another appointment.  Patient not seen.   Mariella Saa, PharmD, [place], CPP Clinical Specialty Pharmacist (Rheumatology and Pulmonology)  04/24/2020 8:25 AM

## 2020-05-02 ENCOUNTER — Inpatient Hospital Stay: Payer: BC Managed Care – PPO | Attending: Hematology and Oncology | Admitting: Hematology and Oncology

## 2020-05-02 ENCOUNTER — Encounter: Payer: Self-pay | Admitting: Hematology and Oncology

## 2020-05-02 ENCOUNTER — Inpatient Hospital Stay: Payer: BC Managed Care – PPO

## 2020-05-02 ENCOUNTER — Other Ambulatory Visit: Payer: Self-pay

## 2020-05-02 VITALS — BP 102/68 | HR 67 | Temp 97.9°F | Resp 18 | Ht 67.0 in | Wt 151.1 lb

## 2020-05-02 DIAGNOSIS — D472 Monoclonal gammopathy: Secondary | ICD-10-CM

## 2020-05-02 DIAGNOSIS — L4050 Arthropathic psoriasis, unspecified: Secondary | ICD-10-CM | POA: Diagnosis not present

## 2020-05-02 DIAGNOSIS — R590 Localized enlarged lymph nodes: Secondary | ICD-10-CM

## 2020-05-02 DIAGNOSIS — F909 Attention-deficit hyperactivity disorder, unspecified type: Secondary | ICD-10-CM | POA: Insufficient documentation

## 2020-05-02 LAB — LACTATE DEHYDROGENASE: LDH: 162 U/L (ref 98–192)

## 2020-05-02 NOTE — Progress Notes (Signed)
[place] [phone]   Fax:([phone]) [place] NOTE  Patient Care Team: Fay Records, MD as PCP - General (Cardiology)  Hematological/Oncological History # Concern for Monoclonal Gammopathy 1) 04/15/2020: Rheumatology performed immunoglobulin workup prior to changing therapies. SPEP showed a restricted band (M-spike) migrating in the gamma globulin region. IFE showed faint IgM Kappa and IgG lambda monoclonal proteins.  2) 05/02/2020: establish care with Dr. Lorenso Courier   CHIEF COMPLAINTS/PURPOSE OF CONSULTATION:  "Abnormal Lab "  HISTORY OF PRESENTING ILLNESS:  Samantha Graves Samantha Graves 49 y.o. female with medical history significant for psoriatic arthritis and ADHD who presents for evaluation of monoclonal gammopathy.  On review of the previous records Samantha Graves was seen by rheumatology on 04/15/2020.  At that time she was having inadequate response to Enbrel and Humira and therefore Simponi therapy was considered.  As part of her initial evaluation she underwent an immunoglobulin work-up.  She had an SPEP performed which showed a restricted band migrating with the gammaglobulin region.  An IFE was performed which showed an IgM kappa and IgG lambda monoclonal proteins.  Due to concern for this finding the patient was referred to hematology for further evaluation management.  On exam today Samantha Graves notes that she is here today for evaluation of abnormal proteins in her serum.  She reports that she has a longstanding history of psoriatic arthritis which was diagnosed in her early 79s.  She was previously on biological therapy, however this was held off due to the Covid outbreak and an episode of the flu she had in February 2020.  She has been off these completely however in the interim did have an issue with elevations in LFTs requiring liver biopsy and eventually cholecystectomy as well as new symptom flares.  She is currently being lined up for a new  biologic therapy however these monoclonal proteins were discovered in the interim.  On further discussion Samantha Graves notes that she has had a enlarged lymph node under her arm for the last few months.  She underwent a diagnostic mammogram as well as an ultrasound of the left axilla which showed isolated superficial island of fibroglandular tissue in the left axilla with solitary morphologically normal left axillary lymph node.  It was decided to repeat imaging in approximately 3 months time.  The family history for this patient is remarkable for a maternal grandfather with multiple myeloma and psoriasis.  Additionally the patient sister had ovarian cancer and maternal cousin had endocrine cancer.  Her mother does have a history of arthritis but was not known to be rheumatologic.  Otherwise Samantha Graves denies having any issues with fevers, chills, sweats, nausea, vomiting or diarrhea.  A full 10 point ROS is listed below.  MEDICAL HISTORY:  Past Medical History:  Diagnosis Date  . ADHD   . Bile duct injury 2020   mild  . Cholecystitis   . Elevated LFTs   . PONV (postoperative nausea and vomiting)   . Psoriatic arthritis ([place])   . Rib fracture     SURGICAL HISTORY: Past Surgical History:  Procedure Laterality Date  . CHOLECYSTECTOMY N/A 08/13/2019   Procedure: LAPAROSCOPIC CHOLECYSTECTOMY;  Surgeon: Rolm Bookbinder, MD;  Location: [facility];  Service: General;  Laterality: N/A;  . INTRAUTERINE DEVICE INSERTION  05/30/15, 05/06/10   MIrena IUD  . TONSILLECTOMY AND ADENOIDECTOMY  1990  . WISDOM TOOTH EXTRACTION Bilateral 1991    SOCIAL HISTORY: Social History   Socioeconomic History  . Marital status: Married  Spouse name: Not on file  . Number of children: Not on file  . Years of education: Not on file  . Highest education level: Not on file  Occupational History  . Not on file  Tobacco Use  . Smoking status: Never Smoker  . Smokeless tobacco: Never Used  Substance and Sexual  Activity  . Alcohol use: Yes    Alcohol/week: 3.0 - 4.0 standard drinks    Types: 3 - 4 Glasses of wine per week    Comment: none since 06/30/2019 over the last 3 months has up to 3 glasses per week only as of 06/26/19  . Drug use: No  . Sexual activity: Yes    Birth control/protection: I.U.D.    Comment: Mirena 05-30-15  Other Topics Concern  . Not on file  Social History Narrative  . Not on file   Social Determinants of Health   Financial Resource Strain:   . Difficulty of Paying Living Expenses:   Food Insecurity:   . Worried About Charity fundraiser in the Last Year:   . Arboriculturist in the Last Year:   Transportation Needs:   . Film/video editor (Medical):   Marland Kitchen Lack of Transportation (Non-Medical):   Physical Activity:   . Days of Exercise per Week:   . Minutes of Exercise per Session:   Stress:   . Feeling of Stress :   Social Connections:   . Frequency of Communication with Friends and Family:   . Frequency of Social Gatherings with Friends and Family:   . Attends Religious Services:   . Active Member of Clubs or Organizations:   . Attends Archivist Meetings:   Marland Kitchen Marital Status:   Intimate Partner Violence:   . Fear of Current or Ex-Partner:   . Emotionally Abused:   Marland Kitchen Physically Abused:   . Sexually Abused:     FAMILY HISTORY: Family History  Problem Relation Age of Onset  . Hyperlipidemia Mother   . Ovarian cysts Mother        benign  . Endometriosis Mother   . Liver disease Mother        NASH  . Other Father        auto imune  . Coronary artery disease Father        cardio ablation  . COPD Maternal Grandmother   . Bone cancer Maternal Grandfather 89  . Arthritis/Rheumatoid Sister 51  . Endometriosis Sister   . Retinitis pigmentosa Sister 11  . Ulcerative colitis Sister 21       surgical repair, J pouch  . Ovarian cancer Sister 53       negative genetic testing  . Liver disease Maternal Aunt        NASH  . Colon cancer Neg Hx     . Esophageal cancer Neg Hx   . Pancreatic cancer Neg Hx   . Stomach cancer Neg Hx     ALLERGIES:  is allergic to tamiflu [oseltamivir phosphate].  MEDICATIONS:  Current Outpatient Medications  Medication Sig Dispense Refill  . fluticasone (FLONASE) 50 MCG/ACT nasal spray Place 2 sprays into both nostrils daily.    Marland Kitchen loratadine (CLARITIN) 10 MG tablet Take 10 mg by mouth daily.    Marland Kitchen amphetamine-dextroamphetamine (ADDERALL) 10 MG tablet Take 10 mg by mouth 2 (two) times daily as needed.    . Cholecalciferol (VITAMIN D3) 125 MCG (5000 UT) CAPS Take 1 capsule by mouth daily.    Marland Kitchen EVEKEO  10 MG TABS Take 10 mg by mouth 2 (two) times daily.   0  . L-THEANINE PO Take by mouth. Takes 1 tablet daily    . levonorgestrel (MIRENA) 20 MCG/24HR IUD 1 each by Intrauterine route once. Mirena IUD.     No current facility-administered medications for this visit.    REVIEW OF SYSTEMS:   Constitutional: ( - ) fevers, ( - )  chills , ( - ) night sweats Eyes: ( - ) blurriness of vision, ( - ) double vision, ( - ) watery eyes Ears, nose, mouth, throat, and face: ( - ) mucositis, ( - ) sore throat Respiratory: ( - ) cough, ( - ) dyspnea, ( - ) wheezes Cardiovascular: ( - ) palpitation, ( - ) chest discomfort, ( - ) lower extremity swelling Gastrointestinal:  ( - ) nausea, ( - ) heartburn, ( - ) change in bowel habits Skin: ( - ) abnormal skin rashes Lymphatics: ( - ) new lymphadenopathy, ( - ) easy bruising Neurological: ( - ) numbness, ( - ) tingling, ( - ) new weaknesses Behavioral/Psych: ( - ) mood change, ( - ) new changes  All other systems were reviewed with the patient and are negative.  PHYSICAL EXAMINATION: ECOG PERFORMANCE STATUS: 1 - Symptomatic but completely ambulatory  Vitals:   05/02/20 0920  BP: 102/68  Pulse: 67  Resp: 18  Temp: 97.9 F (36.6 C)  SpO2: 96%   Filed Weights   05/02/20 0920  Weight: 151 lb 1.6 oz (68.5 kg)    GENERAL: well appearing middle aged Caucasian  female in NAD  SKIN: skin color, texture, turgor are normal, no rashes or significant lesions EYES: conjunctiva are pink and non-injected, sclera clear NECK: supple, non-tender LYMPH:  no palpable lymphadenopathy in the axilla. Possible area of firm tissue, no entirely consistent with lymph node noted.  LUNGS: clear to auscultation and percussion with normal breathing effort HEART: regular rate & rhythm and no murmurs and no lower extremity edema Musculoskeletal: no cyanosis of digits and no clubbing  PSYCH: alert & oriented x 3, fluent speech NEURO: no focal motor/sensory deficits  LABORATORY DATA:  I have reviewed the data as listed CBC Latest Ref Rng & Units 04/15/2020 08/13/2019 07/27/2019  WBC 3.8 - 10.8 Thousand/uL 6.0 4.3 6.7  Hemoglobin 11.7 - 15.5 g/dL 14.5 13.5 13.2  Hematocrit 35.0 - 45.0 % 44.2 40.4 39.1  Platelets 140 - 400 Thousand/uL 195 194 135(L)    CMP Latest Ref Rng & Units 04/15/2020 04/15/2020 02/28/2020  Glucose 65 - 99 mg/dL 93 - -  BUN 7 - 25 mg/dL 13 - -  Creatinine 0.50 - 1.10 mg/dL 0.92 - -  Sodium 135 - 146 mmol/L 137 - -  Potassium 3.5 - 5.3 mmol/L 4.0 - -  Chloride 98 - 110 mmol/L 100 - -  CO2 20 - 32 mmol/L 29 - -  Calcium 8.6 - 10.2 mg/dL 9.7 - -  Total Protein 6.1 - 8.1 g/dL 6.7 7.1 6.7  Total Bilirubin 0.2 - 1.2 mg/dL 0.7 - 0.5  Alkaline Phos 39 - 117 U/L - - 48  AST 10 - 35 U/L 27 - 21  ALT 6 - 29 U/L 18 - 14   No results found for: MPROTEIN  PATHOLOGY: None relevant to review.   RADIOGRAPHIC STUDIES: No results found.  ASSESSMENT & PLAN Samantha Graves 49 y.o. female with medical history significant for psoriatic arthritis and ADHD who presents for evaluation of monoclonal gammopathy.  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  At this time I think there are multiple possible explanations for the abnormal findings on  her IFE.  Patients with high level inflammation often do have abnormal immunoglobulins.  At this time on IFE these findings were quite faint.  In order to confirm the presence of these monoclonal proteins we will repeat an SPEP today and additionally we will collect serum free light chains, beta-2 microglobulin, LDH, and a urinary protein electrophoresis.  Further evaluation could also include a metastatic survey to look for lytic lesions.  In the event the above studies find no high risk features we can continue to monitor this monoclonal gammopathy every 6 months.  In the event that there were to be an irregular serum free light chain ratio, or lytic lesion noted we would proceed with a bone marrow biopsy in order to further evaluate the monoclonal gammopathy.  At this time given the faint bands I have a low suspicion that further evaluation will be required.  #Monoclonal Gammopathy --today will order Multiple Myeloma Panel, SFLC, beta 2 microglobulin, LDH, and UPEP --CBC and CMP from 04/15/2020 reviewed.  --additionally will order a metastatic survey to asess for lytic lesions --no clear indication for a bone marrow biopsy at this time. Can re-evaluated once the above findings are complete --RTC in 6 months time or sooner if indicated by the above labs.   #Enlarged Axillary Lymph Node --noted by patient, confirmed with [place] axilla and mammography --repeat US to be performed in July 2021 --agree with this approach, consider biopsy if lymph node is enlarging or new lymph nodes noted.   Orders Placed This Encounter  Procedures  . DG Bone Survey Met    Standing Status:   Future    Standing Expiration Date:   05/02/2021    Order Specific Question:   Reason for Exam (SYMPTOM  OR DIAGNOSIS REQUIRED)    Answer:   new MGUS diagnosis    Order Specific Question:   Is patient pregnant?    Answer:   No    Order Specific Question:   Preferred imaging location?    Answer:   [facility]    Order  Specific Question:   Radiology Contrast Protocol - do NOT remove file path    Answer:   [email or web address]  . Lactate dehydrogenase (LDH)    Standing Status:   Future    Number of Occurrences:   1    Standing Expiration Date:   05/02/2021  . Beta 2 microglobulin, serum    Standing Status:   Future    Number of Occurrences:   1    Standing Expiration Date:   05/02/2021  . Kappa/lambda light chains    Standing Status:   Future    Number of Occurrences:   1    Standing Expiration Date:   05/02/2021  . Multiple Myeloma Panel (SPEP&IFE w/QIG)    Standing Status:   Future    Number of Occurrences:   1    Standing Expiration Date:   05/02/2021  . 24-Hr Ur UPEP/UIFE/Light Chains/TP    Standing Status:   Future    Standing Expiration Date:   05/02/2021    All questions were answered. The patient knows to call the clinic with any problems, questions or concerns.  A total of more than 60 minutes were spent on this encounter and over  half of that time was spent on counseling and coordination of care as outlined above.   Ledell Peoples, MD Department of Hematology/Oncology [place] at [facility] Phone: [phone] Pager: [phone] Email: [email or web address]  05/02/2020 11:08 AM   Literature Support:  1) Kyle RA, Durie BG, Rajkumar SV, et al. Monoclonal gammopathy of undetermined significance (MGUS) and smoldering (asymptomatic) multiple myeloma: IMWG consensus perspectives risk factors for progression and guidelines for monitoring and management. Leukemia. 2010;24(6):1121-1127. doi:10.1038/leu.2010.60   --If a patient with apparent MGUS has a serum monoclonal protein >15 g/l, IgA or IgM protein type, or an abnormal FLC ratio, a BM aspirate and biopsy should be carried out at baseline to rule out underlying PC malignancy.   2) [name] Ishihara RA, Therneau TM, Rajkumar SV, et al. Prevalence of monoclonal gammopathy of undetermined  significance. N Engl J Med [id number]   --MGUS was found in 3.2 percent of persons 43 years of age or older and 5.3 percent of persons 52 years of age or older.

## 2020-05-05 LAB — KAPPA/LAMBDA LIGHT CHAINS
Kappa free light chain: 16.6 mg/L (ref 3.3–19.4)
Kappa, lambda light chain ratio: 0.97 (ref 0.26–1.65)
Lambda free light chains: 17.1 mg/L (ref 5.7–26.3)

## 2020-05-06 LAB — MULTIPLE MYELOMA PANEL, SERUM
Albumin SerPl Elph-Mcnc: 3.7 g/dL (ref 2.9–4.4)
Albumin/Glob SerPl: 1.5 (ref 0.7–1.7)
Alpha 1: 0.2 g/dL (ref 0.0–0.4)
Alpha2 Glob SerPl Elph-Mcnc: 0.5 g/dL (ref 0.4–1.0)
B-Globulin SerPl Elph-Mcnc: 0.9 g/dL (ref 0.7–1.3)
Gamma Glob SerPl Elph-Mcnc: 0.9 g/dL (ref 0.4–1.8)
Globulin, Total: 2.5 g/dL (ref 2.2–3.9)
IgA: 140 mg/dL (ref 87–352)
IgG (Immunoglobin G), Serum: 818 mg/dL (ref 586–1602)
IgM (Immunoglobulin M), Srm: 182 mg/dL (ref 26–217)
M Protein SerPl Elph-Mcnc: 0.2 g/dL — ABNORMAL HIGH
Total Protein ELP: 6.2 g/dL (ref 6.0–8.5)

## 2020-05-07 DIAGNOSIS — F909 Attention-deficit hyperactivity disorder, unspecified type: Secondary | ICD-10-CM | POA: Diagnosis not present

## 2020-05-07 DIAGNOSIS — R590 Localized enlarged lymph nodes: Secondary | ICD-10-CM | POA: Diagnosis not present

## 2020-05-07 DIAGNOSIS — L4050 Arthropathic psoriasis, unspecified: Secondary | ICD-10-CM | POA: Diagnosis not present

## 2020-05-07 DIAGNOSIS — D472 Monoclonal gammopathy: Secondary | ICD-10-CM | POA: Diagnosis not present

## 2020-05-07 LAB — BETA 2 MICROGLOBULIN, SERUM: Beta-2 Microglobulin: 1.5 mg/L (ref 0.6–2.4)

## 2020-05-08 LAB — UPEP/UIFE/LIGHT CHAINS/TP, 24-HR UR
% BETA, Urine: 0 %
ALPHA 1 URINE: 0 %
Albumin, U: 100 %
Alpha 2, Urine: 0 %
Free Kappa Lt Chains,Ur: 4.62 mg/L (ref 0.63–113.79)
Free Kappa/Lambda Ratio: >6.9 (ref 1.03–31.76)
Free Lambda Lt Chains,Ur: <0.67 mg/L (ref 0.47–11.77)
GAMMA GLOBULIN URINE: 0 %
Total Protein, Urine-Ur/day: 112 mg/24 hr (ref 30–150)
Total Protein, Urine: 5.9 mg/dL

## 2020-05-09 ENCOUNTER — Ambulatory Visit (HOSPITAL_COMMUNITY)
Admission: RE | Admit: 2020-05-09 | Discharge: 2020-05-09 | Disposition: A | Discharge status: Home / Self Care | Payer: BC Managed Care – PPO | Source: Ambulatory Visit | Attending: Hematology and Oncology | Admitting: Hematology and Oncology

## 2020-05-09 ENCOUNTER — Other Ambulatory Visit: Payer: Self-pay

## 2020-05-09 DIAGNOSIS — D472 Monoclonal gammopathy: Secondary | ICD-10-CM | POA: Diagnosis not present

## 2020-05-13 IMAGING — US ULTRASOUND BIOPSY CORE LIVER
2 series · 12 of 12 positions shown · non-contrast
Comparison: Abdominal ultrasound-07/06/2019

INDICATION: Elevated LFTs of uncertain etiology. Please perform
ultrasound-guided liver biopsy for tissue diagnostic purposes.

EXAM:
ULTRASOUND GUIDED LIVER BIOPSY

[Series 1: ultrasound biopsy core liver · 11 of 11 slices shown (1 of 2)]
[im 1/11]
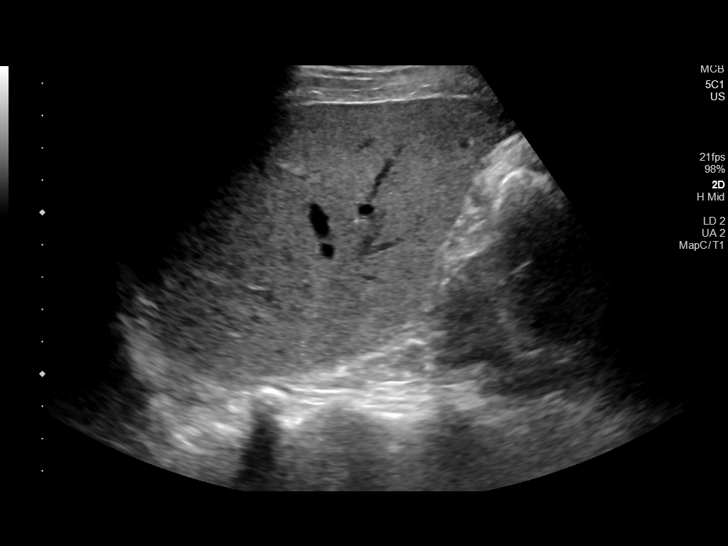
[im 2/11]
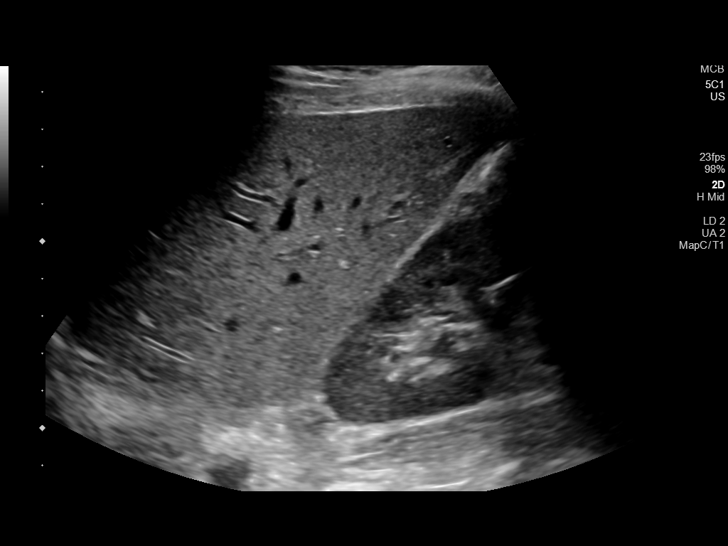
[im 3/11]
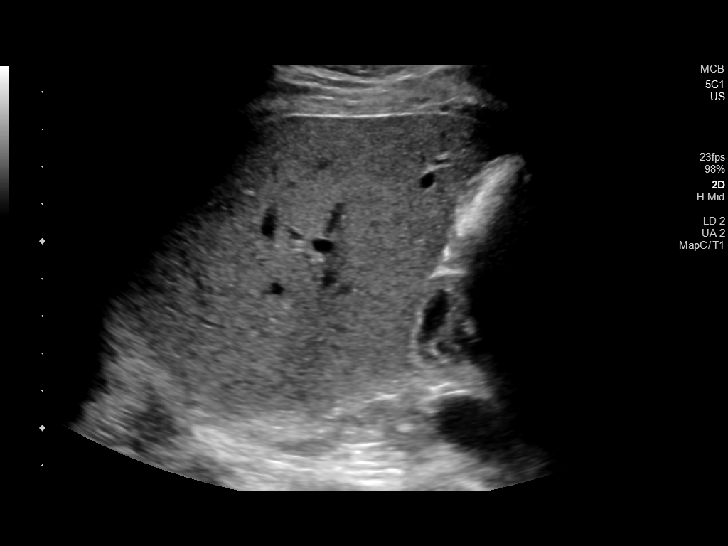
[im 4/11]
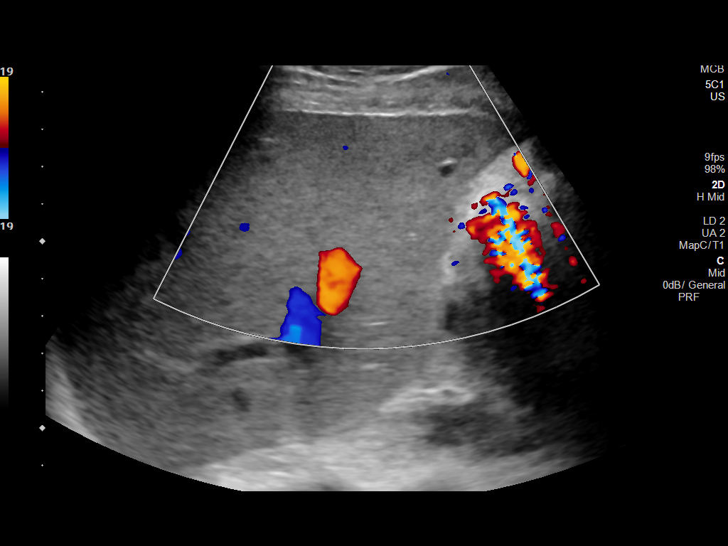
[im 5/11]
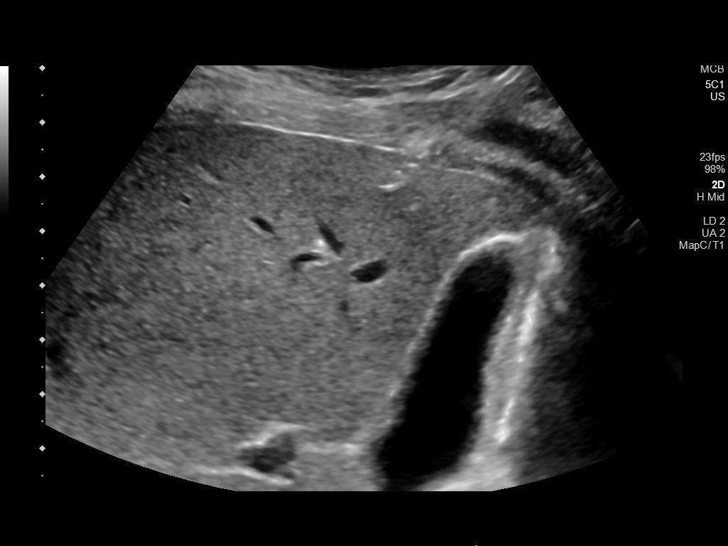
[im 6/11]
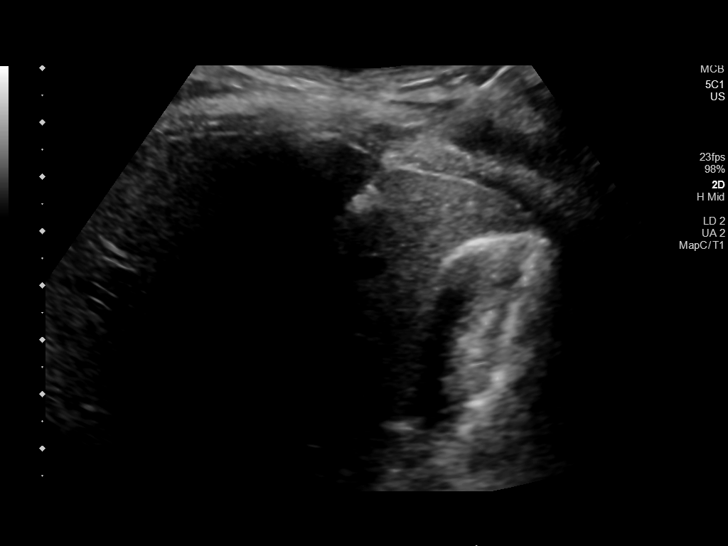
[im 7/11]
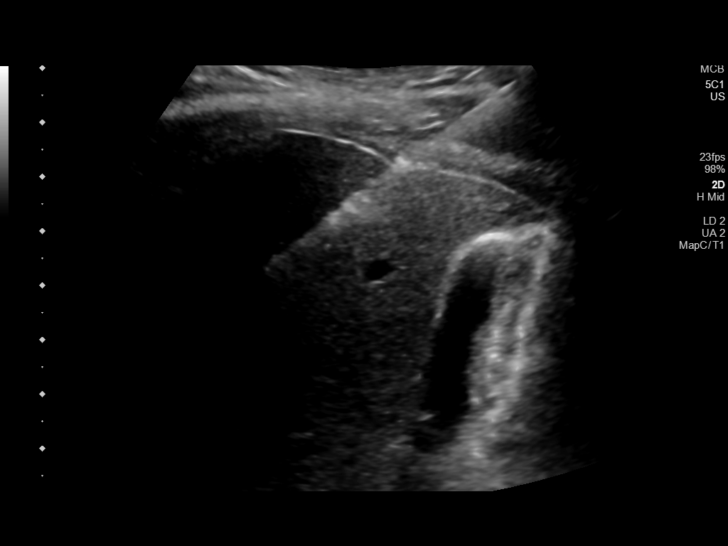
[im 8/11]
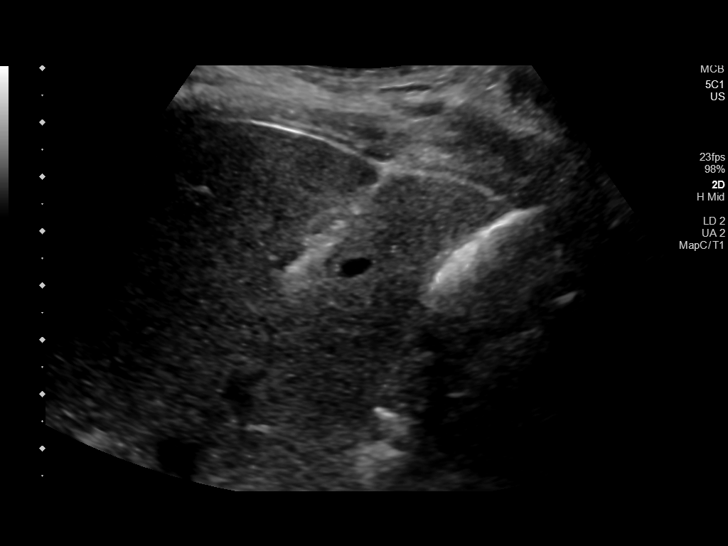
[im 9/11]
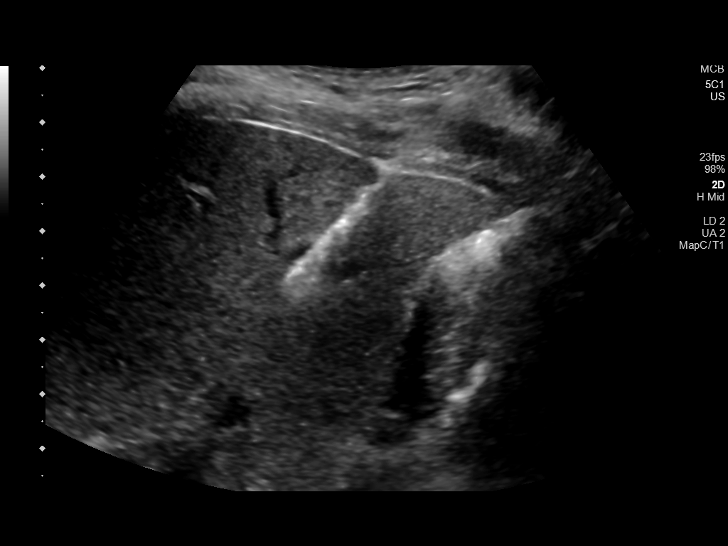
[im 10/11]
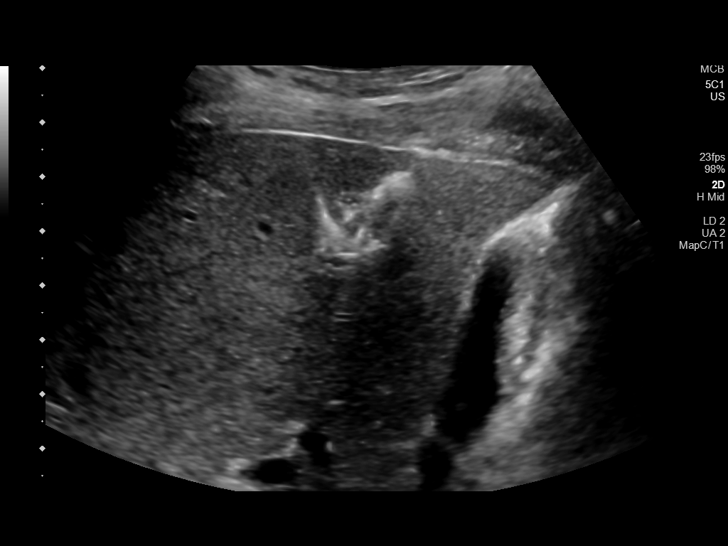
[im 11/11]
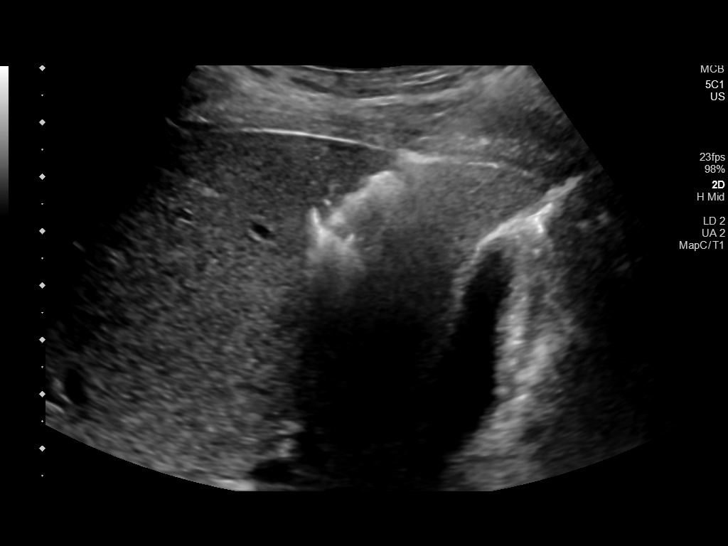

[Series 2: ultrasound biopsy core liver · 1 of 1 slices shown (2 of 2)]
[im 1/1]
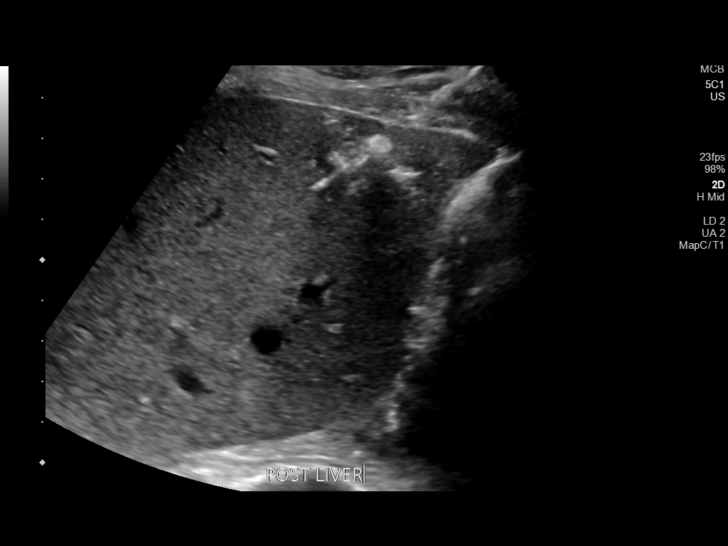

[12 of 12 positions shown; findings below may reference images not displayed]

MEDICATIONS:
None

ANESTHESIA/SEDATION:
Fentanyl 100 mcg IV; Versed 2 mg IV

Total Moderate Sedation time: 10 minutes; The patient was
continuously monitored during the procedure by the interventional
radiology nurse under my direct supervision.

COMPLICATIONS:
None immediate.

PROCEDURE:
Informed written consent was obtained from the patient after a
discussion of the risks, benefits and alternatives to treatment. The
patient understands and consents the procedure. A timeout was
performed prior to the initiation of the procedure.

Ultrasound scanning was performed of the right upper abdominal
quadrant and the procedure was planned. The right upper abdomen was
prepped and draped in the usual sterile fashion. The overlying soft
tissues were anesthetized with 1% lidocaine with epinephrine. A 17
gauge, 6.8 cm co-axial needle was advanced into a peripheral aspect
of the right lobe of the liver and 3 core biopsies were obtained
with an 18 gauge core device under direct ultrasound guidance.

The co-axial needle track was embolized with the administration of a
Gel-Foam slurry. Superficial hemostasis was obtained with manual
compression. Post procedural scanning was negative for definitive
area of hemorrhage. A dressing was placed. The patient tolerated the
procedure well without immediate post procedural complication.
IMPRESSION: Technically successful ultrasound guided liver biopsy.

## 2020-05-14 NOTE — Progress Notes (Deleted)
Office Visit Note  Patient: Samantha Graves             Date of Birth: [birth year]           MRN: [id number]             PCP: Fay Records, MD Referring: Fay Records, MD Visit Date: 05/27/2020 Occupation: '@GUAROCC' @  Subjective:  No chief complaint on file.   History of Present Illness: Samantha Graves is a 49 y.o. female ***   Activities of Daily Living:  Patient reports morning stiffness for *** {minute/hour:19697}.   Patient {ACTIONS;DENIES/REPORTS:21021675::"Denies"} nocturnal pain.  Difficulty dressing/grooming: {ACTIONS;DENIES/REPORTS:21021675::"Denies"} Difficulty climbing stairs: {ACTIONS;DENIES/REPORTS:21021675::"Denies"} Difficulty getting out of chair: {ACTIONS;DENIES/REPORTS:21021675::"Denies"} Difficulty using hands for taps, buttons, cutlery, and/or writing: {ACTIONS;DENIES/REPORTS:21021675::"Denies"}  No Rheumatology ROS completed.   PMFS History:  Patient Active Problem List   Diagnosis Date Noted   Psoriasis 01/11/2017   High risk medication use 01/11/2017   PSORIATIC ARTHRITIS 10/14/2009   ALLERGIC RHINITIS 09/06/2007    Past Medical History:  Diagnosis Date   ADHD    Bile duct injury 2020   mild   Cholecystitis    Elevated LFTs    PONV (postoperative nausea and vomiting)    Psoriatic arthritis ([place])    Rib fracture     Family History  Problem Relation Age of Onset   Hyperlipidemia Mother    Ovarian cysts Mother        benign   Endometriosis Mother    Liver disease Mother        NASH   Other Father        auto imune   Coronary artery disease Father        cardio ablation   COPD Maternal Grandmother    Bone cancer Maternal Grandfather 15   Arthritis/Rheumatoid Sister 30   Endometriosis Sister    Retinitis pigmentosa Sister 27   Ulcerative colitis Sister 21       surgical repair, J pouch   Ovarian cancer Sister 57       negative genetic testing   Liver disease Maternal Aunt        NASH   Colon  cancer Neg Hx    Esophageal cancer Neg Hx    Pancreatic cancer Neg Hx    Stomach cancer Neg Hx    Past Surgical History:  Procedure Laterality Date   CHOLECYSTECTOMY N/A 08/13/2019   Procedure: LAPAROSCOPIC CHOLECYSTECTOMY;  Surgeon: Rolm Bookbinder, MD;  Location: [facility];  Service: General;  Laterality: N/A;   INTRAUTERINE DEVICE INSERTION  05/30/15, 05/06/10   MIrena IUD   TONSILLECTOMY AND ADENOIDECTOMY  1990   WISDOM TOOTH EXTRACTION Bilateral 1991   Social History   Social History Narrative   Not on file   Immunization History  Administered Date(s) Administered   Influenza Whole 08/30/2003   Influenza,inj,Quad PF,6+ Mos 09/01/2015, 09/14/2019   Tdap 02/25/2011     Objective: Vital Signs: There were no vitals taken for this visit.   Physical Exam   Musculoskeletal Exam: ***  CDAI Exam: CDAI Score: -- Patient Global: --; Provider Global: -- Swollen: --; Tender: -- Joint Exam 05/27/2020   No joint exam has been documented for this visit   There is currently no information documented on the homunculus. Go to the Rheumatology activity and complete the homunculus joint exam.  Investigation: No additional findings.  Imaging: DG Bone Survey Met  Result Date: 05/09/2020 CLINICAL DATA:  New MGUS diagnosis EXAM: METASTATIC BONE SURVEY COMPARISON:  None. FINDINGS: There is a small lucent area noted within the acromion of the right scapula. This is well-circumscribed nonspecific. No additional lucent lesions. No acute bony abnormality. Heart is normal size.  Lungs are clear.  No effusions. Prior cholecystectomy.  IUD in place. IMPRESSION: Single lucent lesion within the acromion of the right scapula, nonspecific. Recommend attention on follow-up imaging. Electronically Signed   By: Rolm Baptise M.D.   On: 05/09/2020 22:01    Recent Labs: Lab Results  Component Value Date   WBC 6.0 04/15/2020   HGB 14.5 04/15/2020   PLT 195 04/15/2020   NA 137 04/15/2020   K  4.0 04/15/2020   CL 100 04/15/2020   CO2 29 04/15/2020   GLUCOSE 93 04/15/2020   BUN 13 04/15/2020   CREATININE 0.92 04/15/2020   BILITOT 0.7 04/15/2020   ALKPHOS 48 02/28/2020   AST 27 04/15/2020   ALT 18 04/15/2020   PROT 7.1 04/15/2020   PROT 6.7 04/15/2020   ALBUMIN 4.3 02/28/2020   CALCIUM 9.7 04/15/2020   GFRAA 85 04/15/2020   QFTBGOLD NEGATIVE 11/04/2017   QFTBGOLDPLUS NEGATIVE 04/15/2020    Speciality Comments: No specialty comments available.  Procedures:  No procedures performed Allergies: Tamiflu [oseltamivir phosphate]   Assessment / Plan:     Visit Diagnoses: No diagnosis found.  Orders: No orders of the defined types were placed in this encounter.  No orders of the defined types were placed in this encounter.   Face-to-face time spent with patient was *** minutes. Greater than 50% of time was spent in counseling and coordination of care.  Follow-Up Instructions: No follow-ups on file.   Earnestine Mealing, CMA  Note - This record has been created using Editor, commissioning.  Chart creation errors have been sought, but may not always  have been located. Such creation errors do not reflect on  the standard of medical care.

## 2020-05-15 ENCOUNTER — Telehealth: Payer: Self-pay | Admitting: *Deleted

## 2020-05-15 ENCOUNTER — Telehealth: Payer: Self-pay | Admitting: Hematology and Oncology

## 2020-05-15 NOTE — Telephone Encounter (Signed)
Called Samantha Graves to discuss the results of her bone survey and labs.  Findings are most consistent with a monoclonal gammopathy with an IgM kappa light chain specificity.  There is a small nonspecific lesion noted on the acromion process of the right scapula.  Based on these findings the formal recommendations would be to perform a bone marrow biopsy in the setting of a non-IgG monoclonal gammopathy.  After discussion with the patient she notes that she would prefer to proceed with a bone marrow biopsy given the concern she is having about discomfort in her right shoulder as well as her concern that she may have a hematological malignancy.  I noted that the low monoclonal protein levels are reassuring but that we were happy to pursue the bone marrow biopsy in the setting of textbook recommendations.  She voiced her understanding of this and was eager to have a bone marrow biopsy scheduled for the week of 05/26/2020 when she returns from her trip to [place].  Samantha Peoples, MD Department of Hematology/Oncology [place] at [facility] Phone: [phone] Pager: [phone] Email: [email or web address]

## 2020-05-15 NOTE — Telephone Encounter (Signed)
Received vm message from patient inquiring about her lab results from 05/02/20. All results are available.  Please advsie

## 2020-05-16 ENCOUNTER — Telehealth: Payer: Self-pay | Admitting: Hematology and Oncology

## 2020-05-16 NOTE — Telephone Encounter (Signed)
Scheduled appt per 6/18 sch message - unable to reach pt .left message with appt date and time.  ° °

## 2020-05-24 IMAGING — CR CHEST - 2 VIEW
2 series · 2 of 2 positions shown · non-contrast
Comparison: None.

CLINICAL DATA: Right upper quadrant pain

EXAM:
CHEST - 2 VIEW

[w chest pa]
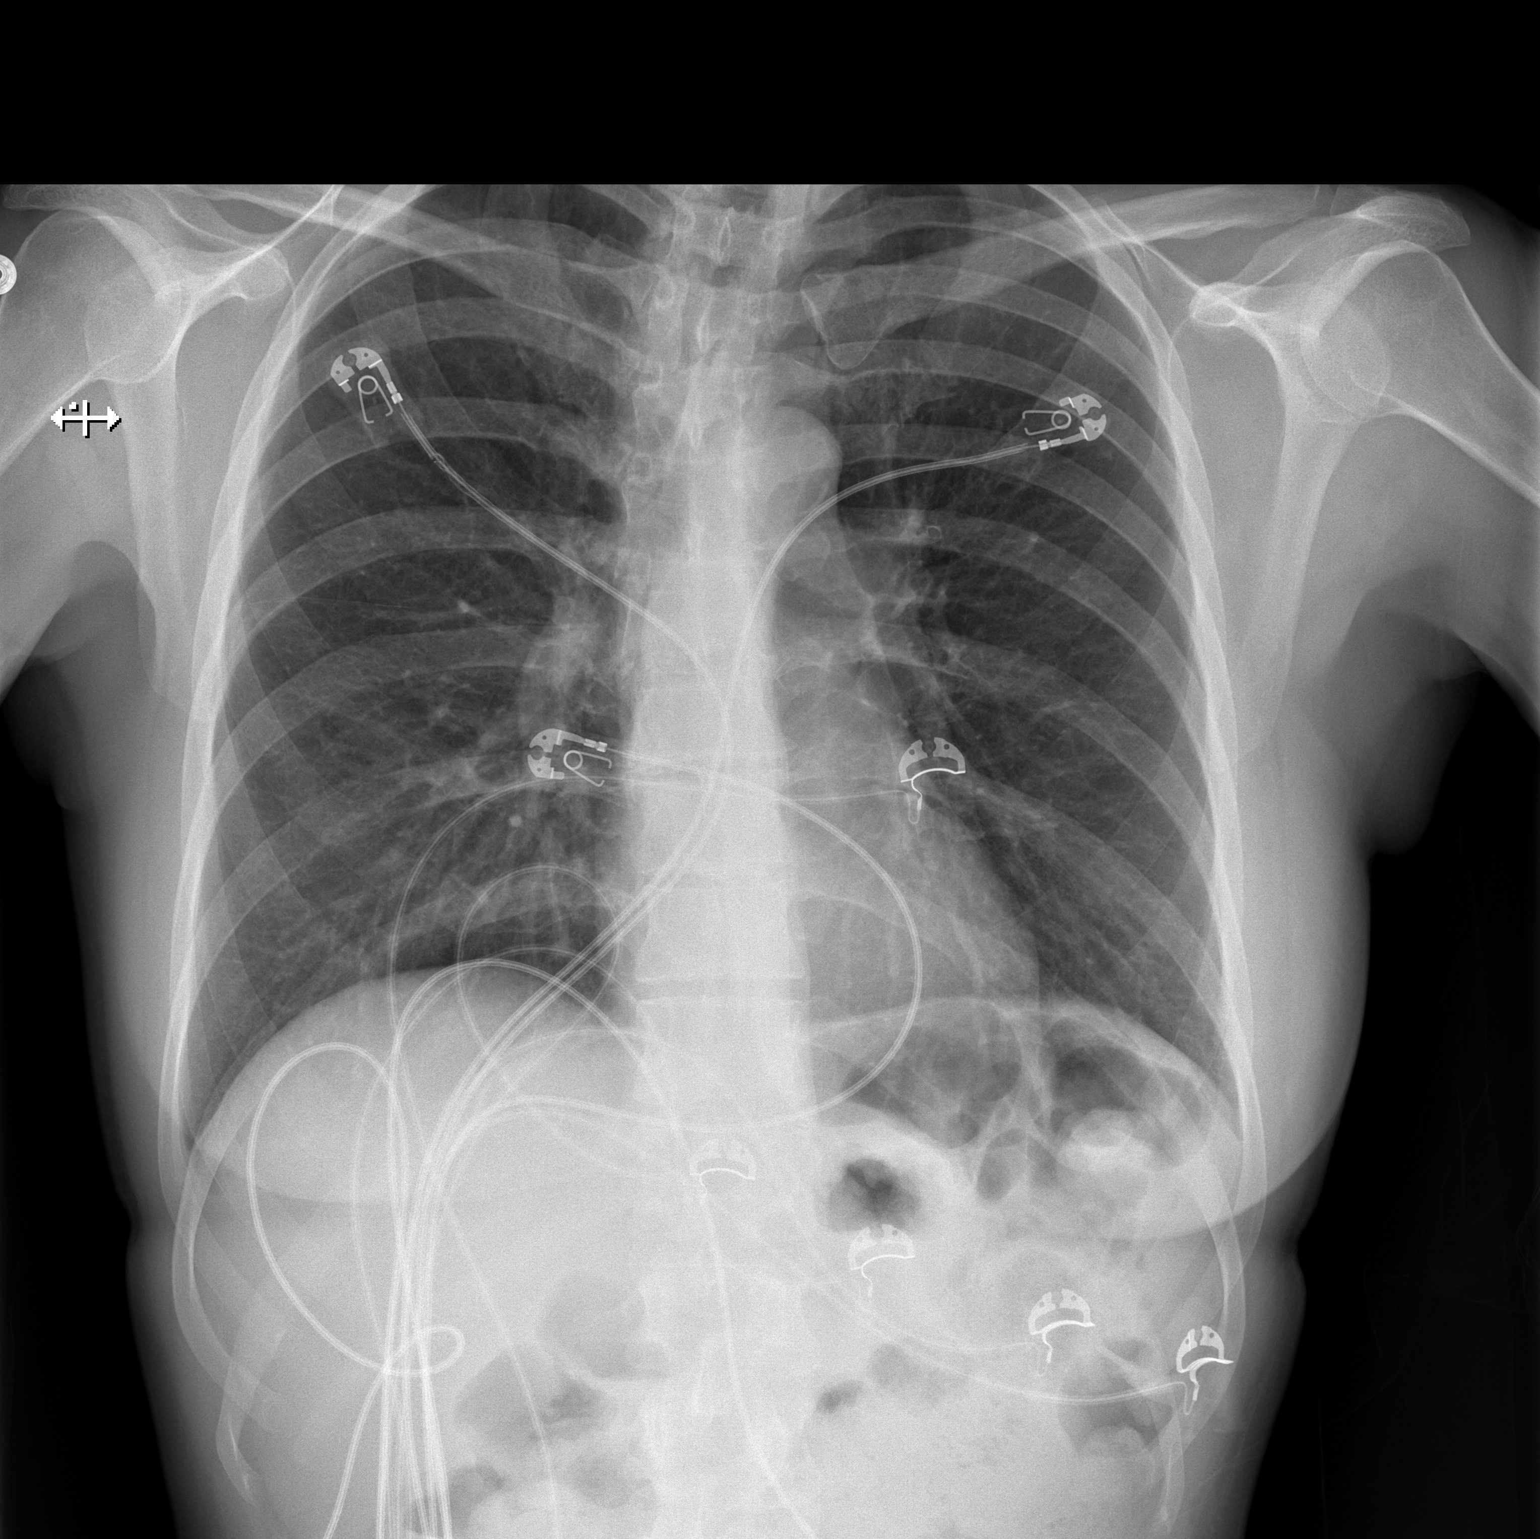

[w chest lat]
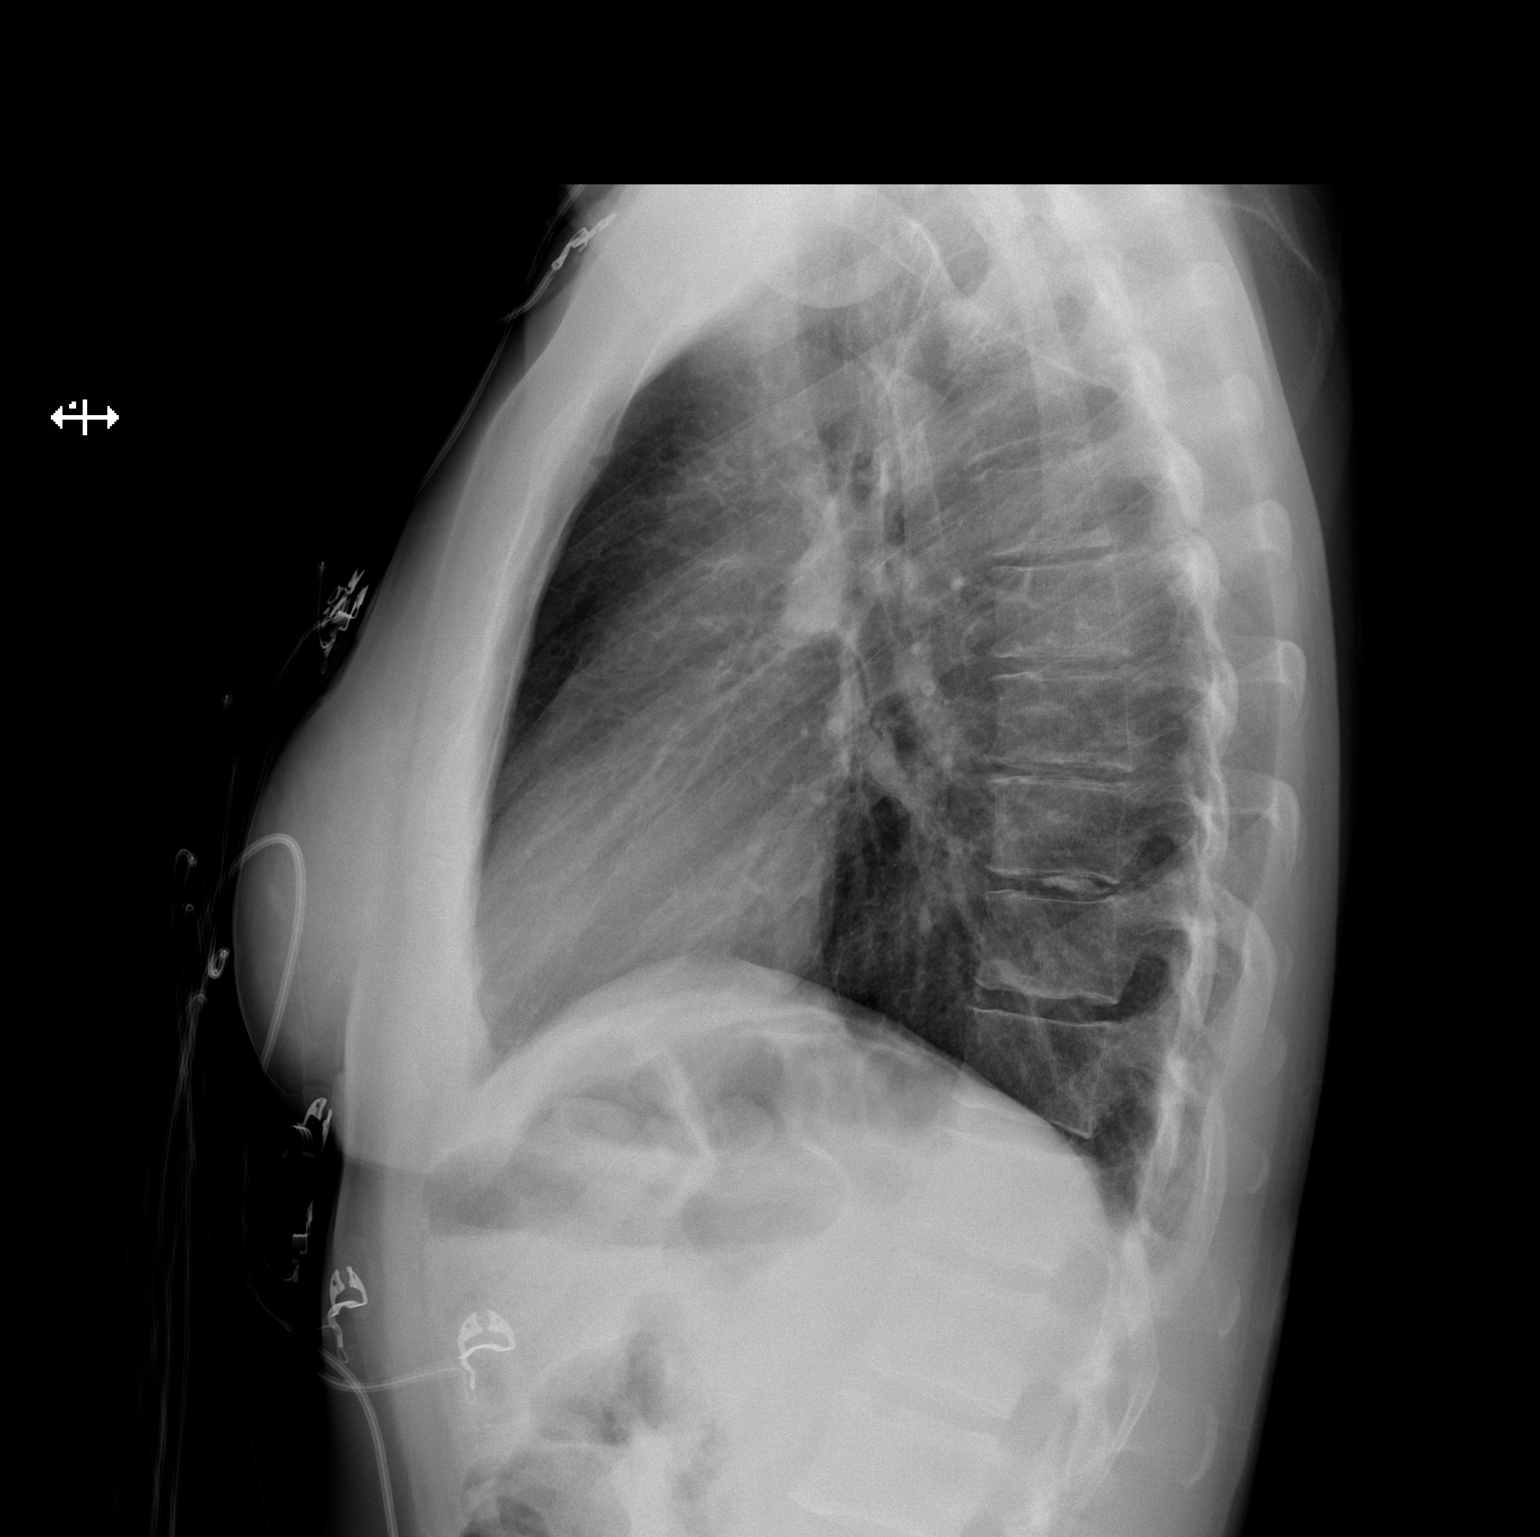

[2 of 2 positions shown; findings below may reference images not displayed]

FINDINGS: The heart size and mediastinal contours are within normal limits.
Both lungs are clear. The visualized skeletal structures are
unremarkable.
IMPRESSION: No active cardiopulmonary disease.

## 2020-05-27 ENCOUNTER — Ambulatory Visit: Payer: BC Managed Care – PPO | Admitting: Rheumatology

## 2020-05-29 ENCOUNTER — Other Ambulatory Visit: Payer: BC Managed Care – PPO

## 2020-05-29 ENCOUNTER — Inpatient Hospital Stay: Payer: BC Managed Care – PPO

## 2020-05-29 ENCOUNTER — Other Ambulatory Visit: Payer: Self-pay

## 2020-05-29 ENCOUNTER — Inpatient Hospital Stay: Payer: BC Managed Care – PPO | Attending: Hematology and Oncology | Admitting: Adult Health

## 2020-05-29 VITALS — BP 120/78 | HR 66 | Temp 98.6°F | Resp 16

## 2020-05-29 DIAGNOSIS — E162 Hypoglycemia, unspecified: Secondary | ICD-10-CM | POA: Diagnosis not present

## 2020-05-29 DIAGNOSIS — D472 Monoclonal gammopathy: Secondary | ICD-10-CM | POA: Diagnosis not present

## 2020-05-29 DIAGNOSIS — R42 Dizziness and giddiness: Secondary | ICD-10-CM | POA: Diagnosis not present

## 2020-05-29 DIAGNOSIS — C9030 Solitary plasmacytoma not having achieved remission: Secondary | ICD-10-CM | POA: Diagnosis not present

## 2020-05-29 LAB — CBC WITH DIFFERENTIAL (CANCER CENTER ONLY)
Abs Immature Granulocytes: 0.02 10*3/uL (ref 0.00–0.07)
Basophils Absolute: 0 10*3/uL (ref 0.0–0.1)
Basophils Relative: 1 %
Eosinophils Absolute: 0.1 10*3/uL (ref 0.0–0.5)
Eosinophils Relative: 2 %
HCT: 39 % (ref 36.0–46.0)
Hemoglobin: 13.3 g/dL (ref 12.0–15.0)
Immature Granulocytes: 0 %
Lymphocytes Relative: 21 %
Lymphs Abs: 1.4 10*3/uL (ref 0.7–4.0)
MCH: 31.8 pg (ref 26.0–34.0)
MCHC: 34.1 g/dL (ref 30.0–36.0)
MCV: 93.3 fL (ref 80.0–100.0)
Monocytes Absolute: 0.7 10*3/uL (ref 0.1–1.0)
Monocytes Relative: 10 %
Neutro Abs: 4.3 10*3/uL (ref 1.7–7.7)
Neutrophils Relative %: 66 %
Platelet Count: 193 10*3/uL (ref 150–400)
RBC: 4.18 MIL/uL (ref 3.87–5.11)
RDW: 11.8 % (ref 11.5–15.5)
WBC Count: 6.6 10*3/uL (ref 4.0–10.5)
nRBC: 0 % (ref 0.0–0.2)

## 2020-05-29 LAB — GLUCOSE, CAPILLARY: Glucose-Capillary: 74 mg/dL (ref 70–99)

## 2020-05-29 IMAGING — NM NM HEPATO W/GB/PHARM/[PERSON_NAME]
2 series · 12 of 12 positions shown · non-contrast
Comparison: None

Correlation: CT abdomen and pelvis 07/26/2019

CLINICAL DATA: RIGHT upper quadrant abdominal pain for 2 weeks post
liver biopsy, nausea for 1 week

EXAM:
NUCLEAR MEDICINE HEPATOBILIARY IMAGING WITH GALLBLADDER EF
TECHNIQUE: Sequential images of the abdomen were obtained [DATE] minutes
following intravenous administration of radiopharmaceutical. After
slow intravenous infusion of 1.36 micrograms Cholecystokinin,
gallbladder ejection fraction was determined.
RADIOPHARMACEUTICALS:  5.43 mCi Lc-RRm Choletec IV

[Series 1: biliary · 3.25mm/px · 6 of 60 frames shown]
[frame 6/60]
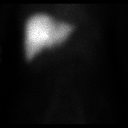
[frame 16/60]
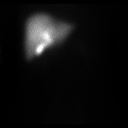
[frame 26/60]
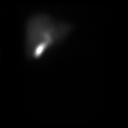
[frame 36/60]
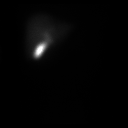
[frame 46/60]
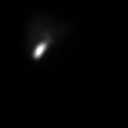
[frame 56/60]
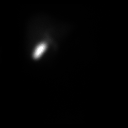

[Series 2: gbef · 3.25mm/px · 6 of 60 frames shown]
[frame 6/60]
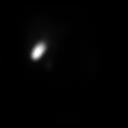
[frame 16/60]
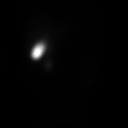
[frame 26/60]
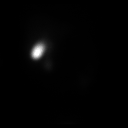
[frame 36/60]
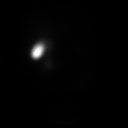
[frame 46/60]
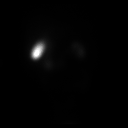
[frame 56/60]
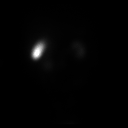

[12 of 12 positions shown; findings below may reference images not displayed]

FINDINGS: Normal tracer extraction from bloodstream indicating normal
hepatocellular function.

Normal excretion of tracer into biliary tree.

Gallbladder visualized at 15 min.

Small bowel not visualized until following CCK stimulation

No hepatic retention of tracer.

No evidence of bile leak/tracer extravasation post liver biopsy.

Subjectively minimal emptying of tracer from gallbladder following
CCK administration.

Calculated gallbladder ejection fraction is 0%, abnormal.

Patient reported no symptoms following CCK administration.

Normal gallbladder ejection fraction following CCK stimulation is
greater than 40% at 1 hour.
IMPRESSION: Patent biliary tree without evidence of bile leak.

Abnormal gallbladder response to CCK stimulation, showing no
emptying following CCK.

## 2020-05-29 MED ORDER — ONDANSETRON HCL 4 MG/2ML IJ SOLN: 8.0000 mg | Freq: Once | INTRAMUSCULAR | Status: DC

## 2020-05-29 MED ORDER — SODIUM CHLORIDE 0.9 % IV SOLN
INTRAVENOUS | Status: AC
  Filled 2020-05-29 (×2): qty 250

## 2020-05-29 NOTE — Progress Notes (Signed)
Patient complained of "wooziness" following bone marrow biopsy this morning. Vitals retaken and noted to be stable. Dressing assessed for any signs of bleeding or saturation. No signs of bleeding on dressing.  Called Wilber Bihari, NP and reported patient's comments. Received orders to start an IV and begin fluids. Causey and Dr. Lindi Adie came to bedside to assess patient. Checked patient's blood sugar and noted it to be 74. Patient refused a snack, but was provided a drink.  Patient resting while receiving fluids. Will observe patient for 1-hour per Dr. Lindi Adie. Will relay any changes to providers.   Patient received a liter of fluids and vitals signs rechecked. Vital signs remained stable with no complaints of pain. Site assessed for any complications - no bleeding or bruising noted at the site. Patient felt comfortable to go home after observation. Educated on after-care and how to call if she had any questions or concerns. Patient verbalized an understanding of the teaching.

## 2020-05-29 NOTE — Progress Notes (Signed)
INDICATION: MGUS/plasmacytoma  Brief examination was performed. ENT: adequate airway clearance Heart: regular rate and rhythm.No Murmurs Lungs: clear to auscultation, no wheezes, normal respiratory effort  Bone Marrow Biopsy and Aspiration Procedure Note   Informed consent was obtained and potential risks including bleeding, infection and pain were reviewed with the patient.  The patient's name, date of birth, identification, consent and allergies were verified prior to the start of procedure and time out was performed.  The left posterior iliac crest was chosen as the site of biopsy.  The skin was prepped with ChloraPrep.   14 cc of 2% lidocaine was used to provide local anaesthesia.   10 cc of bone marrow aspirate was obtained followed by 1cm biopsy.  Pressure was applied to the biopsy site and bandage was placed over the biopsy site. Patient was made to lie on the back for 30 mins prior to discharge.  The procedure was tolerated well. COMPLICATIONS: None BLOOD LOSS: none The patient was discharged home in stable condition with a 1 week follow up to review results.  Patient was provided with post bone marrow biopsy instructions and instructed to call if there was any bleeding or worsening pain.  Specimens sent for flow cytometry, cytogenetics and additional studies.  Signed Scot Dock, NP

## 2020-05-29 NOTE — Patient Instructions (Signed)
Bone Marrow Aspiration and Bone Marrow Biopsy, Adult, Care After This sheet gives you information about how to care for yourself after your procedure. Your health care provider may also give you more specific instructions. If you have problems or questions, contact your health care provider. What can I expect after the procedure? After the procedure, it is common to have:  Mild pain and tenderness.  Swelling.  Bruising. Follow these instructions at home: Puncture site care   Follow instructions from your health care provider about how to take care of the puncture site. Make sure you: ? Wash your hands with soap and water before and after you change your bandage (dressing). If soap and water are not available, use hand sanitizer. ? Change your dressing as told by your health care provider.  Check your puncture site every day for signs of infection. Check for: ? More redness, swelling, or pain. ? Fluid or blood. ? Warmth. ? Pus or a bad smell. Activity  Return to your normal activities as told by your health care provider. Ask your health care provider what activities are safe for you.  Do not lift anything that is heavier than 10 lb (4.5 kg), or the limit that you are told, until your health care provider says that it is safe.  Do not drive for 24 hours if you were given a sedative during your procedure. General instructions   Take over-the-counter and prescription medicines only as told by your health care provider.  Do not take baths, swim, or use a hot tub until your health care provider approves. Ask your health care provider if you may take showers. You may only be allowed to take sponge baths.  If directed, put ice on the affected area. To do this: ? Put ice in a plastic bag. ? Place a towel between your skin and the bag. ? Leave the ice on for 20 minutes, 2-3 times a day.  Keep all follow-up visits as told by your health care provider. This is important. Contact a  health care provider if:  Your pain is not controlled with medicine.  You have a fever.  You have more redness, swelling, or pain around the puncture site.  You have fluid or blood coming from the puncture site.  Your puncture site feels warm to the touch.  You have pus or a bad smell coming from the puncture site. Summary  After the procedure, it is common to have mild pain, tenderness, swelling, and bruising.  Follow instructions from your health care provider about how to take care of the puncture site and what activities are safe for you.  Take over-the-counter and prescription medicines only as told by your health care provider.  Contact a health care provider if you have any signs of infection, such as fluid or blood coming from the puncture site. This information is not intended to replace advice given to you by your health care provider. Make sure you discuss any questions you have with your health care provider. Document Revised: 04/03/2019 Document Reviewed: 04/03/2019 Elsevier Patient Education  2020 Elsevier Inc.  

## 2020-06-03 LAB — SURGICAL PATHOLOGY

## 2020-06-05 ENCOUNTER — Encounter (HOSPITAL_COMMUNITY): Payer: Self-pay | Admitting: Hematology and Oncology

## 2020-06-06 ENCOUNTER — Telehealth: Payer: Self-pay | Admitting: Hematology and Oncology

## 2020-06-06 ENCOUNTER — Telehealth: Payer: Self-pay

## 2020-06-06 DIAGNOSIS — M25511 Pain in right shoulder: Secondary | ICD-10-CM

## 2020-06-06 NOTE — Telephone Encounter (Signed)
Patient reports having some right shoulder issues that she would like to discuss with you. Please call to advise.

## 2020-06-06 NOTE — Telephone Encounter (Signed)
Scan ordered

## 2020-06-06 NOTE — Telephone Encounter (Signed)
Scheduled per sch msg. Called and spoke with patient. Confirmed appt  

## 2020-06-06 NOTE — Telephone Encounter (Signed)
-----   Message from Orson Slick, MD sent at 06/06/2020  3:45 PM EDT ----- Jenetta Downer,  Please schedule Mrs. Degroat for a follow appointment in December 2021 (30 minute f/u with labs before). An open date would work.  Best,   Dr. Lorenso Courier

## 2020-06-06 NOTE — Telephone Encounter (Signed)
I talked to Samantha Graves.  She has negative bone marrow biopsy but monoclonal gammopathy of uncertain significance.  She does have a new cystic lesion on her acromion based on skeletal survey.  Reviewed chest x-ray from last year and it was not present there.  Therefore plan at this time is to order MRI with IV contrast of that area.  She does have some dull aching pain in that shoulder region when she is backpacking.  I think it is reasonable at this time due to the new cystic lesion to obtain that study.

## 2020-06-06 NOTE — Telephone Encounter (Signed)
Called Samantha Graves to discuss the results of her bone marrow biopsy. Findings did not show any clear abnormalities of the bone marrow.  These findings are reassuring that the patient does not have a plasma cell neoplasm and her findings are most consistent with an MGUS at this time.  We will continue to follow with the patient routinely in clinic with serial checks on her SPEP and serum free light chains.  Patient did not answer the call, voice message was left requesting callback.  Ledell Peoples, MD Department of Hematology/Oncology [place] [facility] Phone: [phone] Pager: [phone] Email: [email or web address]

## 2020-06-09 ENCOUNTER — Telehealth: Payer: Self-pay | Admitting: Orthopedic Surgery

## 2020-06-09 NOTE — Telephone Encounter (Signed)
See below. Can you please change order to with and without contrast?

## 2020-06-09 NOTE — Telephone Encounter (Signed)
Please further clarify.

## 2020-06-09 NOTE — Telephone Encounter (Signed)
No arthrogram - does need contrast though

## 2020-06-09 NOTE — Telephone Encounter (Signed)
Order has been changed

## 2020-06-09 NOTE — Telephone Encounter (Signed)
The breast center called.   Wanted to clarify the referral. They said if it is in fact an arthrogram that the patient needs, then the order is fine. But if she needs an MRI then the order is to say MRI RT Shoulder with or without contrast.

## 2020-06-10 ENCOUNTER — Encounter (HOSPITAL_COMMUNITY): Payer: Self-pay | Admitting: Hematology and Oncology

## 2020-06-13 ENCOUNTER — Other Ambulatory Visit: Payer: Self-pay

## 2020-06-13 ENCOUNTER — Other Ambulatory Visit: Payer: Self-pay | Admitting: Obstetrics and Gynecology

## 2020-06-13 ENCOUNTER — Ambulatory Visit
Admission: RE | Admit: 2020-06-13 | Discharge: 2020-06-13 | Disposition: A | Discharge status: Home / Self Care | Payer: BC Managed Care – PPO | Source: Ambulatory Visit | Attending: Obstetrics and Gynecology | Admitting: Obstetrics and Gynecology

## 2020-06-13 DIAGNOSIS — R0989 Other specified symptoms and signs involving the circulatory and respiratory systems: Secondary | ICD-10-CM

## 2020-06-13 DIAGNOSIS — R590 Localized enlarged lymph nodes: Secondary | ICD-10-CM | POA: Diagnosis not present

## 2020-06-25 ENCOUNTER — Ambulatory Visit
Admission: RE | Admit: 2020-06-25 | Discharge: 2020-06-25 | Disposition: A | Discharge status: Home / Self Care | Payer: BC Managed Care – PPO | Source: Ambulatory Visit | Attending: Obstetrics and Gynecology | Admitting: Obstetrics and Gynecology

## 2020-06-25 ENCOUNTER — Other Ambulatory Visit: Payer: Self-pay | Admitting: Obstetrics and Gynecology

## 2020-06-25 ENCOUNTER — Telehealth: Payer: Self-pay | Admitting: Obstetrics and Gynecology

## 2020-06-25 DIAGNOSIS — R0989 Other specified symptoms and signs involving the circulatory and respiratory systems: Secondary | ICD-10-CM

## 2020-06-25 NOTE — Telephone Encounter (Signed)
Will continue MMG hold.

## 2020-06-25 NOTE — Telephone Encounter (Signed)
Phone call from [facility] radiologist, Dr. Kristopher Oppenheim.   Unable to do left axillary lymph node biopsy due to proximity to vessels close by.   Patient has had symptomatic left axillary adenopathy for one year.   Dr. Jeanmarie Plant will contact oncology, Dr. Lorenso Courier to discuss patient's care and determine if surgical excision is needed.

## 2020-06-26 NOTE — Telephone Encounter (Signed)
Burnice Logan, RN  06/26/2020 10:20 AM EDT Back to Top    Patient removed from MMG hold.  MMG recall placed.    Nunzio Cobbs, MD  06/26/2020 8:09 AM EDT     OK to remove from mammogram hold and place in recall for October, 2021.   Addenda ADDENDUM REPORT: 06/25/2020 15:16  ADDENDUM: The patient returned for scheduled ultrasound-guided biopsy of the left axillary lymph node on 06/25/2020.  During the time of pre-procedural scanning, the lymph node in question was noted to be very deep within the left axilla and sitting adjacent to the large axillary vessels. Unfortunately, it could not be safely biopsied using a percutaneous approach due to location. Therefore, the procedure was canceled.  Given no significant changed since March 2021, recommendation is for continued close imaging follow-up of the single prominent left axillary lymph node. The patient was scheduled for an additional left axillary ultrasound in 3 months.  Findings and recommendations were discussed with Dr. Narda Rutherford on 06/25/2020 at 3 p.m.       Routing to Dr. Quincy Simmonds.   Encounter closed.

## 2020-07-06 ENCOUNTER — Ambulatory Visit
Admission: RE | Admit: 2020-07-06 | Discharge: 2020-07-06 | Disposition: A | Discharge status: Home / Self Care | Payer: BC Managed Care – PPO | Source: Ambulatory Visit | Attending: Orthopedic Surgery | Admitting: Orthopedic Surgery

## 2020-07-06 ENCOUNTER — Other Ambulatory Visit: Payer: Self-pay

## 2020-07-06 DIAGNOSIS — M898X1 Other specified disorders of bone, shoulder: Secondary | ICD-10-CM | POA: Diagnosis not present

## 2020-07-06 DIAGNOSIS — M25511 Pain in right shoulder: Secondary | ICD-10-CM

## 2020-07-06 DIAGNOSIS — M19011 Primary osteoarthritis, right shoulder: Secondary | ICD-10-CM | POA: Diagnosis not present

## 2020-07-06 MED ORDER — GADOBENATE DIMEGLUMINE 529 MG/ML IV SOLN
13.0000 mL | Freq: Once | INTRAVENOUS | Status: AC | PRN
  Administered 2020-07-06: 13 mL via INTRAVENOUS

## 2020-07-08 NOTE — Progress Notes (Signed)
Please cancel her.  I talked to her already.  She does not need to come in tomorrow.  Thanks

## 2020-07-09 ENCOUNTER — Ambulatory Visit: Payer: BC Managed Care – PPO | Admitting: Orthopedic Surgery

## 2020-07-17 ENCOUNTER — Telehealth: Payer: Self-pay | Admitting: Rheumatology

## 2020-07-17 NOTE — Telephone Encounter (Signed)
Patient wants to reschedule her nurse visit from several months ago for her first injection. Is it okay to just reschedule?  Please call to advise.

## 2020-07-17 NOTE — Telephone Encounter (Signed)
Attempted to contact the patient and left message for patient to advise we will need to update CMP prior to scheduling appointment.

## 2020-07-17 NOTE — Telephone Encounter (Signed)
She was seen on Apr 15, 2020.  She needs CMP.  After she gets CMP we can start her on Simponi.

## 2020-07-17 NOTE — Telephone Encounter (Signed)
Patient was to start Simponi in May and did not keep her appointment due to being late for another appointment.  Her last office visit was 04/15/20. Okay to start Simponi?  Mariella Saa, PharmD, [place], CPP Clinical Specialty Pharmacist (Rheumatology and Pulmonology)  07/17/2020 4:01 PM

## 2020-07-23 DIAGNOSIS — M545 Low back pain: Secondary | ICD-10-CM | POA: Diagnosis not present

## 2020-07-30 ENCOUNTER — Other Ambulatory Visit: Payer: Self-pay

## 2020-07-30 ENCOUNTER — Other Ambulatory Visit (INDEPENDENT_AMBULATORY_CARE_PROVIDER_SITE_OTHER): Payer: BC Managed Care – PPO

## 2020-07-30 DIAGNOSIS — Z79899 Other long term (current) drug therapy: Secondary | ICD-10-CM

## 2020-07-30 DIAGNOSIS — R7989 Other specified abnormal findings of blood chemistry: Secondary | ICD-10-CM

## 2020-07-30 LAB — HEPATIC FUNCTION PANEL
ALT: 11 U/L (ref 0–35)
AST: 19 U/L (ref 0–37)
Albumin: 4.3 g/dL (ref 3.5–5.2)
Alkaline Phosphatase: 53 U/L (ref 39–117)
Bilirubin, Direct: 0.1 mg/dL (ref 0.0–0.3)
Total Bilirubin: 0.4 mg/dL (ref 0.2–1.2)
Total Protein: 6.6 g/dL (ref 6.0–8.3)

## 2020-07-31 LAB — CBC WITH DIFFERENTIAL/PLATELET
Absolute Monocytes: 531 cells/uL (ref 200–950)
Basophils Absolute: 49 cells/uL (ref 0–200)
Basophils Relative: 0.8 %
Eosinophils Absolute: 98 cells/uL (ref 15–500)
Eosinophils Relative: 1.6 %
HCT: 40.2 % (ref 35.0–45.0)
Hemoglobin: 13.3 g/dL (ref 11.7–15.5)
Lymphs Abs: 1305 cells/uL (ref 850–3900)
MCH: 31.2 pg (ref 27.0–33.0)
MCHC: 33.1 g/dL (ref 32.0–36.0)
MCV: 94.4 fL (ref 80.0–100.0)
MPV: 10.9 fL (ref 7.5–12.5)
Monocytes Relative: 8.7 %
Neutro Abs: 4118 cells/uL (ref 1500–7800)
Neutrophils Relative %: 67.5 %
Platelets: 206 10*3/uL (ref 140–400)
RBC: 4.26 10*6/uL (ref 3.80–5.10)
RDW: 11.4 % (ref 11.0–15.0)
Total Lymphocyte: 21.4 %
WBC: 6.1 10*3/uL (ref 3.8–10.8)

## 2020-07-31 LAB — COMPLETE METABOLIC PANEL WITH GFR
AG Ratio: 2 (calc) (ref 1.0–2.5)
ALT: 10 U/L (ref 6–29)
AST: 19 U/L (ref 10–35)
Albumin: 4.1 g/dL (ref 3.6–5.1)
Alkaline phosphatase (APISO): 52 U/L (ref 31–125)
BUN: 16 mg/dL (ref 7–25)
CO2: 31 mmol/L (ref 20–32)
Calcium: 9.5 mg/dL (ref 8.6–10.2)
Chloride: 103 mmol/L (ref 98–110)
Creat: 0.84 mg/dL (ref 0.50–1.10)
GFR, Est African American: 95 mL/min/{1.73_m2} (ref >=60)
GFR, Est Non African American: 82 mL/min/{1.73_m2} (ref >=60)
Globulin: 2.1 g/dL (calc) (ref 1.9–3.7)
Glucose, Bld: 103 mg/dL — ABNORMAL HIGH (ref 65–99)
Potassium: 4.5 mmol/L (ref 3.5–5.3)
Sodium: 139 mmol/L (ref 135–146)
Total Bilirubin: 0.5 mg/dL (ref 0.2–1.2)
Total Protein: 6.2 g/dL (ref 6.1–8.1)

## 2020-07-31 NOTE — Progress Notes (Signed)
CBC/CMP within normal limits. We can now schedule appointment to start Simponi.  Please make patient aware that appointment will take 45 mins-1 hour due to monitoring.

## 2020-08-01 DIAGNOSIS — M545 Low back pain: Secondary | ICD-10-CM | POA: Diagnosis not present

## 2020-08-11 NOTE — Progress Notes (Deleted)
Pharmacy Note  Subjective:   Patient presents to clinic today to receive first dose of Simponi.  Patient running a fever or have signs/symptoms of infection? {yes/no:20286}  Patient currently on antibiotics for the treatment of infection? {yes/no:20286}  Patient have any upcoming invasive procedures/surgeries? {yes/no:20286}  Objective: CMP     Component Value Date/Time   NA 139 07/30/2020 1533   K 4.5 07/30/2020 1533   CL 103 07/30/2020 1533   CO2 31 07/30/2020 1533   GLUCOSE 103 (H) 07/30/2020 1533   BUN 16 07/30/2020 1533   CREATININE 0.84 07/30/2020 1533   CALCIUM 9.5 07/30/2020 1533   PROT 6.2 07/30/2020 1533   ALBUMIN 4.3 07/30/2020 1512   AST 19 07/30/2020 1533   ALT 10 07/30/2020 1533   ALKPHOS 53 07/30/2020 1512   BILITOT 0.5 07/30/2020 1533   GFRNONAA 82 07/30/2020 1533   GFRAA 95 07/30/2020 1533    CBC    Component Value Date/Time   WBC 6.1 07/30/2020 1533   RBC 4.26 07/30/2020 1533   HGB 13.3 07/30/2020 1533   HGB 13.3 05/29/2020 0834   HGB 13.2 04/02/2013 1416   HCT 40.2 07/30/2020 1533   PLT 206 07/30/2020 1533   PLT 193 05/29/2020 0834   MCV 94.4 07/30/2020 1533   MCH 31.2 07/30/2020 1533   MCHC 33.1 07/30/2020 1533   RDW 11.4 07/30/2020 1533   LYMPHSABS 1,305 07/30/2020 1533   MONOABS 0.7 05/29/2020 0834   EOSABS 98 07/30/2020 1533   BASOSABS 49 07/30/2020 1533    Baseline Immunosuppressant Therapy Labs TB GOLD Quantiferon TB Gold Latest Ref Rng & Units 04/15/2020  Quantiferon TB Gold Plus NEGATIVE NEGATIVE   Hepatitis Panel Hepatitis Latest Ref Rng & Units 06/29/2019  Hep B Surface Ag NON-REACTI NON-REACTIVE  Hep C Ab NON-REACTI NON-REACTIVE  Hep C Ab NON-REACTI NON-REACTIVE   HIV Lab Results  Component Value Date   HIV NON-REACTIVE 04/15/2020   Immunoglobulins Immunoglobulin Electrophoresis Latest Ref Rng & Units 05/02/2020  IgA  47 - 310 mg/dL -  IgG 586 - 1,602 mg/dL 818  IgM 26 - 217 mg/dL 182   SPEP Serum Protein  Electrophoresis Latest Ref Rng & Units 07/30/2020  Total Protein 6.1 - 8.1 g/dL 6.2  Albumin 3.8 - 4.8 g/dL -  Alpha-1 0.2 - 0.3 g/dL -  Alpha-2 0.5 - 0.9 g/dL -  Beta Globulin 0.4 - 0.6 g/dL -  Beta 2 0.2 - 0.5 g/dL -  Gamma Globulin 0.8 - 1.7 g/dL -   G6PD No results found for: G6PDH TPMT No results found for: TPMT   Chest x-ray: ***  Assessment/Plan:  Demonstrated proper injection technique with Simponi demo pen.  Patient able to demonstrate proper injection technique using the teach back method. Patient self injected in the **** with:  Sample Medication: *** NDC: *** Lot: *** Expiration: **  Patient tolerated well.  Observed for 30 mins in office for adverse reaction and ***.   Patient is to return in 1 month for follow-up appointment and for labs.  Standing orders placed.  Simponi approved through insurance with zero dollar co-pay.  Prescription sent to [facility].  All questions encouraged and answered.  Instructed patient to call with any further questions or concerns.  Mariella Saa, PharmD, [facility] Rheumatology Clinical Pharmacist  08/11/2020 11:22 AM

## 2020-08-13 ENCOUNTER — Ambulatory Visit: Payer: BC Managed Care – PPO

## 2020-08-13 DIAGNOSIS — M545 Low back pain: Secondary | ICD-10-CM | POA: Diagnosis not present

## 2020-08-14 ENCOUNTER — Other Ambulatory Visit: Payer: Self-pay | Admitting: Rheumatology

## 2020-08-14 ENCOUNTER — Other Ambulatory Visit: Payer: Self-pay

## 2020-08-14 ENCOUNTER — Ambulatory Visit (INDEPENDENT_AMBULATORY_CARE_PROVIDER_SITE_OTHER): Payer: BC Managed Care – PPO | Admitting: Pharmacist

## 2020-08-14 VITALS — BP 95/59 | HR 70

## 2020-08-14 DIAGNOSIS — L4050 Arthropathic psoriasis, unspecified: Secondary | ICD-10-CM

## 2020-08-14 MED ORDER — PREDNISONE 5 MG PO TABS: ORAL_TABLET | ORAL | 0 refills | Status: AC

## 2020-08-14 MED ORDER — GOLIMUMAB 50 MG/0.5ML ~~LOC~~ SOAJ
50.0000 mg | SUBCUTANEOUS | 2 refills | Status: DC
Start: 2020-08-14 — End: 2020-11-18

## 2020-08-14 NOTE — Patient Instructions (Signed)
Standing Labs °We placed an order today for your standing lab work.  ° °Please have your standing labs drawn in 1 month and then every 3 months ° °If possible, please have your labs drawn 2 weeks prior to your appointment so that the provider can discuss your results at your appointment. ° °We have open lab daily °Monday through Thursday from 8:30-12:30 PM and 1:30-4:30 PM and Friday from 8:30-12:30 PM and 1:30-4:00 PM °at the office of Dr. Shaili Deveshwar, [facility].   °Please be advised, patients with office appointments requiring lab work will take precedents over walk-in lab work.  °If possible, please come for your lab work on Monday and Friday afternoons, as you may experience shorter wait times. °The office is located at [address], [address], [place], [place] [address] °No appointment is necessary.   °Labs are drawn by [facility]. Please bring your co-pay at the time of your lab draw.  You may receive a bill from [facility] for your lab work. ° °If you wish to have your labs drawn at another location, please call the office 24 hours in advance to send orders. ° °If you have any questions regarding directions or hours of operation,  °please call [phone].   °As a reminder, please drink plenty of water prior to coming for your lab work. Thanks! ° °

## 2020-08-14 NOTE — Progress Notes (Signed)
Pharmacy Note  Subjective:   Patient presents to clinic today to receive first dose of Simponi.  Patient running a fever or have signs/symptoms of infection? No  Patient currently on antibiotics for the treatment of infection? No  Patient have any upcoming invasive procedures/surgeries? No  Objective: CMP     Component Value Date/Time   NA 139 07/30/2020 1533   K 4.5 07/30/2020 1533   CL 103 07/30/2020 1533   CO2 31 07/30/2020 1533   GLUCOSE 103 (H) 07/30/2020 1533   BUN 16 07/30/2020 1533   CREATININE 0.84 07/30/2020 1533   CALCIUM 9.5 07/30/2020 1533   PROT 6.2 07/30/2020 1533   ALBUMIN 4.3 07/30/2020 1512   AST 19 07/30/2020 1533   ALT 10 07/30/2020 1533   ALKPHOS 53 07/30/2020 1512   BILITOT 0.5 07/30/2020 1533   GFRNONAA 82 07/30/2020 1533   GFRAA 95 07/30/2020 1533    CBC    Component Value Date/Time   WBC 6.1 07/30/2020 1533   RBC 4.26 07/30/2020 1533   HGB 13.3 07/30/2020 1533   HGB 13.3 05/29/2020 0834   HGB 13.2 04/02/2013 1416   HCT 40.2 07/30/2020 1533   PLT 206 07/30/2020 1533   PLT 193 05/29/2020 0834   MCV 94.4 07/30/2020 1533   MCH 31.2 07/30/2020 1533   MCHC 33.1 07/30/2020 1533   RDW 11.4 07/30/2020 1533   LYMPHSABS 1,305 07/30/2020 1533   MONOABS 0.7 05/29/2020 0834   EOSABS 98 07/30/2020 1533   BASOSABS 49 07/30/2020 1533    Baseline Immunosuppressant Therapy Labs TB GOLD Quantiferon TB Gold Latest Ref Rng & Units 04/15/2020  Quantiferon TB Gold Plus NEGATIVE NEGATIVE   Hepatitis Panel Hepatitis Latest Ref Rng & Units 06/29/2019  Hep B Surface Ag NON-REACTI NON-REACTIVE  Hep C Ab NON-REACTI NON-REACTIVE  Hep C Ab NON-REACTI NON-REACTIVE   HIV Lab Results  Component Value Date   HIV NON-REACTIVE 04/15/2020   Immunoglobulins Immunoglobulin Electrophoresis Latest Ref Rng & Units 05/02/2020  IgA  47 - 310 mg/dL -  IgG 586 - 1,602 mg/dL 818  IgM 26 - 217 mg/dL 182   SPEP Serum Protein Electrophoresis Latest Ref Rng & Units  07/30/2020  Total Protein 6.1 - 8.1 g/dL 6.2  Albumin 3.8 - 4.8 g/dL -  Alpha-1 0.2 - 0.3 g/dL -  Alpha-2 0.5 - 0.9 g/dL -  Beta Globulin 0.4 - 0.6 g/dL -  Beta 2 0.2 - 0.5 g/dL -  Gamma Globulin 0.8 - 1.7 g/dL -   G6PD No results found for: G6PDH TPMT No results found for: TPMT   Chest x-ray: no active cardiopulmonary disease 07/27/2019  Assessment/Plan:  Demonstrated proper injection technique with Simponi SmartJect demo.  Patient able to demonstrate proper injection technique using the teach back method.  Patient self injected in the right upper thigh with:  Sample Medication: Simponi SmartJect 50 mg/ml Lot: [id number] Expiration: 09/28/2022  Patient tolerated well.  Observed for 30 mins in office for adverse reaction and without issue.   Patient is to return in 6-8 weeks for follow-up appointment and 1 month for labs.  Standing orders placed. Prescription sent to [facility] and patient co-pay should be $0.  Address confirmed and  first shipment set for 9/20 to deliver on 9/21.  Patient complained about right hand pain and dactylitis which is starting to affect her ability to do daily activities.  Discussed with Hazel Sams, PA and recommend prednisone taper starting at 20 mg and decreasing by 5 mg daily.  Prescription  sent to [facility] per patient request.  All questions encouraged and answered.  Instructed patient to call with any further questions or concerns.  Mariella Saa, PharmD, [facility] Rheumatology Clinical Pharmacist  08/14/2020 8:35 AM

## 2020-08-18 MED FILL — SIMPONI 50 MG/0.5ML SOAJ: 50 | 28 days supply | Qty: 1 | Fill #0

## 2020-08-25 DIAGNOSIS — M545 Low back pain: Secondary | ICD-10-CM | POA: Diagnosis not present

## 2020-09-11 NOTE — Progress Notes (Signed)
Office Visit Note  Patient: Samantha Graves             Date of Birth: 1971-01-30           MRN: [id number]             PCP: Fay Records, MD Referring: Fay Records, MD Visit Date: 09/25/2020 Occupation: @GUAROCC @  Subjective:  Medication monitoring  History of Present Illness: Samantha Graves is a 49 y.o. female with history of psoriatic arthritis.  Patient was restarted on Simponi subcu injections on 08/14/2020.  She states that within 24 hours after injections she started noticing less pain and inflammation in her left second MCP joint.  She has noticed a significant improvement in her symptoms after having the past 2 injections.  She experiences some discomfort and stiffness in both SI joints during the night.  She reports that she has been able to be active and exercises on a regular basis without difficulty.  She denies any Achilles denies or plantar fasciitis. She still has a few small patches of psoriasis along her hairline but has not been using any topical agents.   Activities of Daily Living:  Patient reports morning stiffness for 2 hours.   Patient Reports nocturnal pain.  Difficulty dressing/grooming: Reports Difficulty climbing stairs: Denies Difficulty getting out of chair: Denies Difficulty using hands for taps, buttons, cutlery, and/or writing: Reports  Review of Systems  Constitutional: Positive for fatigue.  HENT: Negative for mouth sores, mouth dryness and nose dryness.   Eyes: Negative for pain, visual disturbance and dryness.  Respiratory: Negative for cough, hemoptysis, shortness of breath and difficulty breathing.   Cardiovascular: Negative for chest pain, palpitations, hypertension and swelling in legs/feet.  Gastrointestinal: Negative for blood in stool, constipation and diarrhea.  Endocrine: Negative for increased urination.  Genitourinary: Negative for difficulty urinating and painful urination.  Musculoskeletal: Positive for arthralgias,  joint pain, joint swelling, morning stiffness and muscle tenderness. Negative for myalgias, muscle weakness and myalgias.  Skin: Negative for color change, pallor, rash, hair loss, nodules/bumps, skin tightness, ulcers and sensitivity to sunlight.  Allergic/Immunologic: Negative for susceptible to infections.  Neurological: Negative for dizziness, numbness, headaches and weakness.  Hematological: Negative for bruising/bleeding tendency and swollen glands.  Psychiatric/Behavioral: Positive for sleep disturbance. Negative for depressed mood. The patient is not nervous/anxious.     PMFS History:  Patient Active Problem List   Diagnosis Date Noted  . Psoriasis 01/11/2017  . High risk medication use 01/11/2017  . PSORIATIC ARTHRITIS 10/14/2009  . ALLERGIC RHINITIS 09/06/2007    Past Medical History:  Diagnosis Date  . ADHD   . Bile duct injury 2020   mild  . Cholecystitis   . Elevated LFTs   . PONV (postoperative nausea and vomiting)   . Psoriatic arthritis ([place])   . Rib fracture     Family History  Problem Relation Age of Onset  . Hyperlipidemia Mother   . Ovarian cysts Mother        benign  . Endometriosis Mother   . Liver disease Mother        NASH  . Other Father        auto imune  . Coronary artery disease Father        cardio ablation  . COPD Maternal Grandmother   . Bone cancer Maternal Grandfather 71  . Arthritis/Rheumatoid Sister 34  . Endometriosis Sister   . Retinitis pigmentosa Sister 43  . Ulcerative colitis Sister 65  surgical repair, J pouch  . Ovarian cancer Sister 72       negative genetic testing  . Liver disease Maternal Aunt        NASH  . Colon cancer Neg Hx   . Esophageal cancer Neg Hx   . Pancreatic cancer Neg Hx   . Stomach cancer Neg Hx    Past Surgical History:  Procedure Laterality Date  . CHOLECYSTECTOMY N/A 08/13/2019   Procedure: LAPAROSCOPIC CHOLECYSTECTOMY;  Surgeon: Rolm Bookbinder, MD;  Location: [facility];  Service: General;   Laterality: N/A;  . INTRAUTERINE DEVICE INSERTION  05/30/15, 05/06/10   MIrena IUD  . TONSILLECTOMY AND ADENOIDECTOMY  1990  . WISDOM TOOTH EXTRACTION Bilateral 1991   Social History   Social History Narrative  . Not on file   Immunization History  Administered Date(s) Administered  . Influenza Whole 08/30/2003  . Influenza,inj,Quad PF,6+ Mos 09/01/2015, 09/14/2019  . PFIZER SARS-COV-2 Vaccination 02/09/2020, 03/03/2020  . Tdap 02/25/2011     Objective: Vital Signs: BP 119/77 (BP Location: Left Arm, Patient Position: Sitting, Cuff Size: Normal)   Pulse 73   Resp 14   Ht 5' 7.5" (1.715 m)   Wt 150 lb (68 kg)   BMI 23.15 kg/m    Physical Exam Vitals and nursing note reviewed.  Constitutional:      Appearance: She is well-developed.  HENT:     Head: Normocephalic and atraumatic.  Eyes:     Conjunctiva/sclera: Conjunctivae normal.  Pulmonary:     Effort: Pulmonary effort is normal.  Abdominal:     Palpations: Abdomen is soft.  Musculoskeletal:     Cervical back: Normal range of motion.  Skin:    General: Skin is warm and dry.     Capillary Refill: Capillary refill takes less than 2 seconds.  Neurological:     Mental Status: She is alert and oriented to person, place, and time.  Psychiatric:        Behavior: Behavior normal.      Musculoskeletal Exam: C-spine, thoracic spine, and lumbar spine good ROM with no discomfort.  No midline spinal tenderness.  No SI joint tenderness.  Shoulder joints, elbow joints, wrist joints, MCPs, PIPs, DIPs have good range of motion with no synovitis.  Synovial thickening of the left second MCP joint noted.  Mild PIP and DIP thickening noted.  Hip joints have good range of motion with some stiffness in her right hip.  No tenderness over trochanteric bursa bilaterally.  Knee joints have good range of motion with no warmth or effusion.  Ankle joints have good range of motion with no tenderness.   CDAI Exam: CDAI Score: -- Patient Global:  --; Provider Global: -- Swollen: --; Tender: -- Joint Exam 09/25/2020   No joint exam has been documented for this visit   There is currently no information documented on the homunculus. Go to the Rheumatology activity and complete the homunculus joint exam.  Investigation: No additional findings.  Imaging: No results found.  Recent Labs: Lab Results  Component Value Date   WBC 6.1 07/30/2020   HGB 13.3 07/30/2020   PLT 206 07/30/2020   NA 139 07/30/2020   K 4.5 07/30/2020   CL 103 07/30/2020   CO2 31 07/30/2020   GLUCOSE 103 (H) 07/30/2020   BUN 16 07/30/2020   CREATININE 0.84 07/30/2020   BILITOT 0.5 07/30/2020   ALKPHOS 53 07/30/2020   AST 19 07/30/2020   ALT 10 07/30/2020   PROT 6.2 07/30/2020  ALBUMIN 4.3 07/30/2020   CALCIUM 9.5 07/30/2020   GFRAA 95 07/30/2020   QFTBGOLD NEGATIVE 11/04/2017   QFTBGOLDPLUS NEGATIVE 04/15/2020    Speciality Comments: No specialty comments available.  Procedures:  No procedures performed Allergies: Oseltamivir phosphate and Tamiflu [oseltamivir phosphate]   Assessment / Plan:     Visit Diagnoses: Psoriatic arthritis ([place]): She has no synovitis or dactylitis on examination today.  She has synovial thickening of the left second MCP joint with mild tenderness.  She has not been experiencing any Achilles tendinitis or plantar fasciitis.  She experiences nocturnal pain and stiffness in her SI joints.  She has no SI joint tenderness palpation on examination today.  She has been exercising on a regular basis without difficulty.  She was started on Simponi 50 mg subcutaneous injections on 08/14/2020.  She has had a total of 2 doses.  She has no significant improvement since starting on Simponi.  She has been tolerating Simponi without any side effects.  She has not had any recent infections.  She will continue on the current treatment regimen.  A refill will be sent to the pharmacy today.  She was advised to notify us if she develops  increased joint pain or joint swelling.  She will follow-up in the office in 3 months.   Psoriasis: She has a few small patches of psoriasis along her hairline.  She declined a prescription for a topical agent at this time. She will continue on Simponi 50 mg sq injections every 28 days.   High risk medication use - Simponi 50 mg sq injections once every 28 days-started on 08/14/20. Inadequate response to Enbrel and Humira.  D/c Humira in February 2020 due to the concern for covid-19.  CBC and CMP were drawn on 07/30/2020.  She is due to update lab work today.  Orders for CBC and CMP were released.  Her next lab work will be due in January and every 3 months to monitor for drug toxicity.  Standing orders for CBC and CMP are in place.  TB gold was negative on 04/15/2020 and will continue to be monitored yearly.- Plan: CBC with Differential/Platelet, COMPLETE METABOLIC PANEL WITH GFR She has not had any recent infections.  She was advised to hold Simponi if she develops signs or symptoms of an infection and to resume once infection has completely cleared.  She has received both COVID-19 vaccinations and plans on receiving the booster dose.  She is advised to avoid taking Tylenol and NSAIDs 24 hours prior to the third dose.  She is also planning on receiving the annual influenza vaccine.  She is advised to notify us or PCP if she develops a COVID-19 infection in order to receive the monoclonal antibody infusion.  She voiced understanding.  Chronic SI joint pain: She experiences intermittent SI joint pain and stiffness especially at night.  She has no tenderness palpation on examination today.  Trochanteric bursitis of both hips: She has intermittent discomfort due to trochanter bursitis of both hips.  She has no tenderness of patient on examination today.  Elevated LFTs: LFTs were within normal limits on 07/30/2020.  CMP was ordered today.  Orders: Orders Placed This Encounter  Procedures  . CBC with  Differential/Platelet  . COMPLETE METABOLIC PANEL WITH GFR   No orders of the defined types were placed in this encounter.     Follow-Up Instructions: Return in about 3 months (around 12/26/2020) for Psoriatic arthritis.   Ofilia Neas, PA-C  Note - This  record has been created using [employer].  Chart creation errors have been sought, but may not always  have been located. Such creation errors do not reflect on  the standard of medical care.

## 2020-09-25 ENCOUNTER — Other Ambulatory Visit: Payer: Self-pay

## 2020-09-25 ENCOUNTER — Encounter: Payer: Self-pay | Admitting: Physician Assistant

## 2020-09-25 ENCOUNTER — Ambulatory Visit (INDEPENDENT_AMBULATORY_CARE_PROVIDER_SITE_OTHER): Payer: BC Managed Care – PPO | Admitting: Physician Assistant

## 2020-09-25 VITALS — BP 119/77 | HR 73 | Resp 14 | Ht 67.5 in | Wt 150.0 lb

## 2020-09-25 DIAGNOSIS — M7062 Trochanteric bursitis, left hip: Secondary | ICD-10-CM

## 2020-09-25 DIAGNOSIS — L4050 Arthropathic psoriasis, unspecified: Secondary | ICD-10-CM

## 2020-09-25 DIAGNOSIS — Z79899 Other long term (current) drug therapy: Secondary | ICD-10-CM | POA: Diagnosis not present

## 2020-09-25 DIAGNOSIS — G8929 Other chronic pain: Secondary | ICD-10-CM

## 2020-09-25 DIAGNOSIS — M7061 Trochanteric bursitis, right hip: Secondary | ICD-10-CM

## 2020-09-25 DIAGNOSIS — L409 Psoriasis, unspecified: Secondary | ICD-10-CM | POA: Diagnosis not present

## 2020-09-25 DIAGNOSIS — M533 Sacrococcygeal disorders, not elsewhere classified: Secondary | ICD-10-CM

## 2020-09-25 DIAGNOSIS — R7989 Other specified abnormal findings of blood chemistry: Secondary | ICD-10-CM

## 2020-09-25 NOTE — Patient Instructions (Addendum)
Standing Labs We placed an order today for your standing lab work.   Please have your standing labs drawn in January and every 3 months  If possible, please have your labs drawn 2 weeks prior to your appointment so that the provider can discuss your results at your appointment.  We have open lab daily Monday through Thursday from 8:30-12:30 PM and 1:30-4:30 PM and Friday from 8:30-12:30 PM and 1:30-4:00 PM at the office of Dr. Bo Merino, [facility].   Please be advised, patients with office appointments requiring lab work will take precedents over walk-in lab work.  If possible, please come for your lab work on Monday and Friday afternoons, as you may experience shorter wait times. The office is located at [address] [place], [place], [place] [address] No appointment is necessary.   Labs are drawn by [facility]. Please bring your co-pay at the time of your lab draw.  You may receive a bill from [facility] for your lab work.  If you wish to have your labs drawn at another location, please call the office 24 hours in advance to send orders.  If you have any questions regarding directions or hours of operation,  please call [phone].   As a reminder, please drink plenty of water prior to coming for your lab work. Thanks!   COVID-19 vaccine recommendations:   COVID-19 vaccine is recommended for everyone (unless you are allergic to a vaccine component), even if you are on a medication that suppresses your immune system.   If you are on Methotrexate, Cellcept (mycophenolate), Rinvoq, Morrie Sheldon, and Olumiant- hold the medication for 1 week after each vaccine. Hold Methotrexate for 2 weeks after the single dose COVID-19 vaccine.   If you are on Orencia subcutaneous injection - hold medication one week prior to and one week after the first COVID-19 vaccine dose (only).   If you are on Orencia IV infusions- time vaccination administration so that the first COVID-19  vaccination will occur four weeks after the infusion and postpone the subsequent infusion by one week.   If you are on Cyclophosphamide or Rituxan infusions please contact your doctor prior to receiving the COVID-19 vaccine.   Do not take Tylenol or any anti-inflammatory medications (NSAIDs) 24 hours prior to the COVID-19 vaccination.   There is no direct evidence about the efficacy of the COVID-19 vaccine in individuals who are on medications that suppress the immune system.   Even if you are fully vaccinated, and you are on any medications that suppress your immune system, please continue to wear a mask, maintain at least six feet social distance and practice hand hygiene.   If you develop a COVID-19 infection, please contact your PCP or our office to determine if you need monoclonal antibody infusion.  The booster vaccine is now available for immunocompromised patients.   Please see the following web sites for updated information.   [email or web address]

## 2020-09-25 NOTE — Progress Notes (Signed)
Patient is in need of a simponi refill. I contact [facility] and there are two refills remaining on prescription. I attempted to contact patient and left message on machine to advise patient and also for her to call the pharmacy to schedule shipment or pick up.

## 2020-09-26 ENCOUNTER — Other Ambulatory Visit: Payer: Self-pay

## 2020-09-26 ENCOUNTER — Other Ambulatory Visit: Payer: BC Managed Care – PPO

## 2020-09-26 ENCOUNTER — Ambulatory Visit
Admission: RE | Admit: 2020-09-26 | Discharge: 2020-09-26 | Disposition: A | Discharge status: Home / Self Care | Payer: BC Managed Care – PPO | Source: Ambulatory Visit | Attending: Obstetrics and Gynecology | Admitting: Obstetrics and Gynecology

## 2020-09-26 DIAGNOSIS — R590 Localized enlarged lymph nodes: Secondary | ICD-10-CM | POA: Diagnosis not present

## 2020-09-26 DIAGNOSIS — R0989 Other specified symptoms and signs involving the circulatory and respiratory systems: Secondary | ICD-10-CM

## 2020-09-26 LAB — COMPLETE METABOLIC PANEL WITH GFR
AG Ratio: 2.1 (calc) (ref 1.0–2.5)
ALT: 13 U/L (ref 6–29)
AST: 24 U/L (ref 10–35)
Albumin: 4.6 g/dL (ref 3.6–5.1)
Alkaline phosphatase (APISO): 47 U/L (ref 31–125)
BUN: 13 mg/dL (ref 7–25)
CO2: 30 mmol/L (ref 20–32)
Calcium: 9.5 mg/dL (ref 8.6–10.2)
Chloride: 101 mmol/L (ref 98–110)
Creat: 0.79 mg/dL (ref 0.50–1.10)
GFR, Est African American: 103 mL/min/{1.73_m2} (ref >=60)
GFR, Est Non African American: 89 mL/min/{1.73_m2} (ref >=60)
Globulin: 2.2 g/dL (calc) (ref 1.9–3.7)
Glucose, Bld: 86 mg/dL (ref 65–99)
Potassium: 4.2 mmol/L (ref 3.5–5.3)
Sodium: 138 mmol/L (ref 135–146)
Total Bilirubin: 1.1 mg/dL (ref 0.2–1.2)
Total Protein: 6.8 g/dL (ref 6.1–8.1)

## 2020-09-26 LAB — CBC WITH DIFFERENTIAL/PLATELET
Absolute Monocytes: 340 cells/uL (ref 200–950)
Basophils Absolute: 39 cells/uL (ref 0–200)
Basophils Relative: 0.9 %
Eosinophils Absolute: 82 cells/uL (ref 15–500)
Eosinophils Relative: 1.9 %
HCT: 44.5 % (ref 35.0–45.0)
Hemoglobin: 15 g/dL (ref 11.7–15.5)
Lymphs Abs: 1492 cells/uL (ref 850–3900)
MCH: 31.6 pg (ref 27.0–33.0)
MCHC: 33.7 g/dL (ref 32.0–36.0)
MCV: 93.9 fL (ref 80.0–100.0)
MPV: 10.8 fL (ref 7.5–12.5)
Monocytes Relative: 7.9 %
Neutro Abs: 2348 cells/uL (ref 1500–7800)
Neutrophils Relative %: 54.6 %
Platelets: 213 10*3/uL (ref 140–400)
RBC: 4.74 10*6/uL (ref 3.80–5.10)
RDW: 11.4 % (ref 11.0–15.0)
Total Lymphocyte: 34.7 %
WBC: 4.3 10*3/uL (ref 3.8–10.8)

## 2020-09-26 NOTE — Progress Notes (Signed)
CBC and CMP WNL

## 2020-10-03 DIAGNOSIS — M545 Low back pain, unspecified: Secondary | ICD-10-CM | POA: Diagnosis not present

## 2020-10-03 DIAGNOSIS — Z23 Encounter for immunization: Secondary | ICD-10-CM | POA: Diagnosis not present

## 2020-10-06 MED FILL — SIMPONI 50 MG/0.5ML SOAJ: 50 | 28 days supply | Qty: 1 | Fill #1

## 2020-10-17 DIAGNOSIS — M545 Low back pain, unspecified: Secondary | ICD-10-CM | POA: Diagnosis not present

## 2020-10-28 MED FILL — SIMPONI 50 MG/0.5ML SOAJ: 50 | 28 days supply | Qty: 1 | Fill #2

## 2020-11-14 ENCOUNTER — Inpatient Hospital Stay: Payer: BC Managed Care – PPO

## 2020-11-14 ENCOUNTER — Inpatient Hospital Stay: Payer: BC Managed Care – PPO | Attending: Hematology and Oncology | Admitting: Hematology and Oncology

## 2020-11-14 ENCOUNTER — Other Ambulatory Visit: Payer: Self-pay | Admitting: Hematology and Oncology

## 2020-11-14 ENCOUNTER — Other Ambulatory Visit: Payer: Self-pay

## 2020-11-14 VITALS — BP 94/64 | HR 66 | Temp 98.1°F | Resp 18 | Ht 67.5 in | Wt 147.2 lb

## 2020-11-14 DIAGNOSIS — D472 Monoclonal gammopathy: Secondary | ICD-10-CM | POA: Diagnosis not present

## 2020-11-14 DIAGNOSIS — Z23 Encounter for immunization: Secondary | ICD-10-CM | POA: Insufficient documentation

## 2020-11-14 LAB — CBC WITH DIFFERENTIAL (CANCER CENTER ONLY)
Abs Immature Granulocytes: 0.01 10*3/uL (ref 0.00–0.07)
Basophils Absolute: 0 10*3/uL (ref 0.0–0.1)
Basophils Relative: 1 %
Eosinophils Absolute: 0.1 10*3/uL (ref 0.0–0.5)
Eosinophils Relative: 2 %
HCT: 43.3 % (ref 36.0–46.0)
Hemoglobin: 14.8 g/dL (ref 12.0–15.0)
Immature Granulocytes: 0 %
Lymphocytes Relative: 40 %
Lymphs Abs: 1.9 10*3/uL (ref 0.7–4.0)
MCH: 31.5 pg (ref 26.0–34.0)
MCHC: 34.2 g/dL (ref 30.0–36.0)
MCV: 92.1 fL (ref 80.0–100.0)
Monocytes Absolute: 0.4 10*3/uL (ref 0.1–1.0)
Monocytes Relative: 8 %
Neutro Abs: 2.4 10*3/uL (ref 1.7–7.7)
Neutrophils Relative %: 49 %
Platelet Count: 197 10*3/uL (ref 150–400)
RBC: 4.7 MIL/uL (ref 3.87–5.11)
RDW: 11.7 % (ref 11.5–15.5)
WBC Count: 4.9 10*3/uL (ref 4.0–10.5)
nRBC: 0 % (ref 0.0–0.2)

## 2020-11-14 LAB — CMP (CANCER CENTER ONLY)
ALT: 17 U/L (ref 0–44)
AST: 23 U/L (ref 15–41)
Albumin: 4 g/dL (ref 3.5–5.0)
Alkaline Phosphatase: 47 U/L (ref 38–126)
Anion gap: 7 (ref 5–15)
BUN: 17 mg/dL (ref 6–20)
CO2: 26 mmol/L (ref 22–32)
Calcium: 9.3 mg/dL (ref 8.9–10.3)
Chloride: 104 mmol/L (ref 98–111)
Creatinine: 0.87 mg/dL (ref 0.44–1.00)
GFR, Estimated: >60 mL/min (ref >60)
Glucose, Bld: 103 mg/dL — ABNORMAL HIGH (ref 70–99)
Potassium: 4.1 mmol/L (ref 3.5–5.1)
Sodium: 137 mmol/L (ref 135–145)
Total Bilirubin: 0.9 mg/dL (ref 0.3–1.2)
Total Protein: 6.8 g/dL (ref 6.5–8.1)

## 2020-11-14 LAB — LACTATE DEHYDROGENASE: LDH: 164 U/L (ref 98–192)

## 2020-11-14 MED ORDER — INFLUENZA VAC SPLIT QUAD 0.5 ML IM SUSY
0.5000 mL | PREFILLED_SYRINGE | Freq: Once | INTRAMUSCULAR | Status: AC
  Administered 2020-11-14: 11:00:00 0.5 mL via INTRAMUSCULAR

## 2020-11-14 MED ORDER — INFLUENZA VAC SPLIT QUAD 0.5 ML IM SUSY
PREFILLED_SYRINGE | INTRAMUSCULAR | Status: AC
  Filled 2020-11-14: qty 0.5

## 2020-11-14 NOTE — Patient Instructions (Signed)
Provided information related to flu vaccine to pt.

## 2020-11-17 LAB — KAPPA/LAMBDA LIGHT CHAINS
Kappa free light chain: 16.7 mg/L (ref 3.3–19.4)
Kappa, lambda light chain ratio: 1.21 (ref 0.26–1.65)
Lambda free light chains: 13.8 mg/L (ref 5.7–26.3)

## 2020-11-18 ENCOUNTER — Other Ambulatory Visit: Payer: Self-pay | Admitting: Rheumatology

## 2020-11-18 DIAGNOSIS — L4050 Arthropathic psoriasis, unspecified: Secondary | ICD-10-CM

## 2020-11-18 LAB — MULTIPLE MYELOMA PANEL, SERUM
Albumin SerPl Elph-Mcnc: 3.8 g/dL (ref 2.9–4.4)
Albumin/Glob SerPl: 1.7 (ref 0.7–1.7)
Alpha 1: 0.2 g/dL (ref 0.0–0.4)
Alpha2 Glob SerPl Elph-Mcnc: 0.5 g/dL (ref 0.4–1.0)
B-Globulin SerPl Elph-Mcnc: 0.8 g/dL (ref 0.7–1.3)
Gamma Glob SerPl Elph-Mcnc: 0.8 g/dL (ref 0.4–1.8)
Globulin, Total: 2.3 g/dL (ref 2.2–3.9)
IgA: 137 mg/dL (ref 87–352)
IgG (Immunoglobin G), Serum: 814 mg/dL (ref 586–1602)
IgM (Immunoglobulin M), Srm: 190 mg/dL (ref 26–217)
M Protein SerPl Elph-Mcnc: 0.1 g/dL — ABNORMAL HIGH
Total Protein ELP: 6.1 g/dL (ref 6.0–8.5)

## 2020-11-18 LAB — BETA 2 MICROGLOBULIN, SERUM: Beta-2 Microglobulin: 1.4 mg/L (ref 0.6–2.4)

## 2020-11-18 NOTE — Telephone Encounter (Signed)
Last Visit: 09/25/2020 Next Visit: due January 2022. Message sent to the front to schedule patient Labs: 11/14/2020 Glucose 103 rest of CMP and CBC WNL TB Gold: 04/15/2020 Neg   Current Dose per office note 09/25/2020: Simponi 50 mg sq injections once every 28 days  TV:[id number] arthritis   Okay to refill per Dr. Estanislado Pandy

## 2020-11-18 NOTE — Telephone Encounter (Signed)
Please schedule patient for a follow up visit. Patient due January 2022. Thanks!  

## 2020-11-18 NOTE — Telephone Encounter (Signed)
LMOM for patient to call and schedule follow-up appointment.   °

## 2020-11-19 ENCOUNTER — Telehealth: Payer: Self-pay | Admitting: *Deleted

## 2020-11-19 NOTE — Telephone Encounter (Signed)
-----   Message from Orson Slick, MD sent at 11/18/2020  8:49 PM EST ----- Please let Mrs. Clarey know that her [facility] labs are stable. We will plan to see her back in 6 months time for continued monitoring.  ----- Message ----- From: Interface, Lab In [place] Sent: 11/14/2020  10:29 AM EST To: Orson Slick, MD

## 2020-11-19 NOTE — Telephone Encounter (Signed)
TCT patient regarding  Recent lab results. No answer but was able to leave message that her labs are stable and we will see her back in 5 months. Provided call back # of [phone] if she had any questions or concerns.

## 2020-11-21 ENCOUNTER — Encounter: Payer: Self-pay | Admitting: Hematology and Oncology

## 2020-11-21 NOTE — Progress Notes (Signed)
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 SPEP showed a restricted band (M-spike) migrating in the gamma globulin region. IFE showed faint IgM Kappa and IgG lambda monoclonal proteins.  2) 05/02/2020: establish care with Dr. Lorenso Courier   Interval History:  Samantha Graves 49 y.o. female with medical history significant for IgM Kappa MGUS who presents for a follow up visit. The patient's last visit was on 05/02/2020 at which time she established care. In the interim since the last visit workup including a bone marrow biopsy confirmed diagnosis of MGUS. 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 [place] Axilla showed resolution of her lymph nodes.  On exam today Samantha Graves notes been well in the interim since her last visit. She notes that she is been started arthritis medication as her back "is not that good". She reports that she has been using stretches in order to try to help with her back pain. She notes that she has had no any recent issues with fevers, chills, sweats, nausea, vomiting or diarrhea. Her weight is been stable and she is had no new bone or back pain. The above work-up including the MRI and ultrasound the axilla with the bone marrow biopsy have been reassuring to her that there is not a more severe underlying condition. A full 10 point ROS is listed below.  MEDICAL HISTORY:  Past Medical History:  Diagnosis Date  . ADHD   . Bile duct injury 2020   mild  . Cholecystitis   . Elevated LFTs   . PONV (postoperative nausea and vomiting)   . Psoriatic arthritis ([place])   . Rib fracture     SURGICAL HISTORY: Past Surgical  History:  Procedure Laterality Date  . CHOLECYSTECTOMY N/A 08/13/2019   Procedure: LAPAROSCOPIC CHOLECYSTECTOMY;  Surgeon: Rolm Bookbinder, MD;  Location: [place];  Service: General;  Laterality: N/A;  . INTRAUTERINE DEVICE INSERTION  05/30/15, 05/06/10   MIrena IUD  . TONSILLECTOMY AND ADENOIDECTOMY  1990  . WISDOM TOOTH EXTRACTION Bilateral 1991    SOCIAL HISTORY: Social History   Socioeconomic History  . Marital status: Married    Spouse name: Not on file  . Number of children: Not on file  . Years of education: Not on file  . Highest education level: Not on file  Occupational History  . Not on file  Tobacco Use  . Smoking status: Never Smoker  . Smokeless tobacco: Never Used  Vaping Use  . Vaping Use: Never used  Substance and Sexual Activity  . Alcohol use: Yes    Alcohol/week: 3.0 - 4.0 standard drinks    Types: 3 - 4 Glasses of wine per week    Comment: none since 06/30/2019 over the last 3 months has up to 3 glasses per week only as of 06/26/19  . Drug use: No  . Sexual activity: Yes    Birth control/protection: I.U.D.    Comment: Mirena 05-30-15  Other Topics Concern  . Not on file  Social History Narrative  . Not on file   Social Determinants of Health   Financial Resource Strain: Not on file  Food Insecurity: Not on file  Transportation Needs: Not on file  Physical Activity: Not on  file  Stress: Not on file  Social Connections: Not on file  Intimate Partner Violence: Not on file    FAMILY HISTORY: Family History  Problem Relation Age of Onset  . Hyperlipidemia Mother   . Ovarian cysts Mother        benign  . Endometriosis Mother   . Liver disease Mother        NASH  . Other Father        auto imune  . Coronary artery disease Father        cardio ablation  . COPD Maternal Grandmother   . Bone cancer Maternal Grandfather 47  . Arthritis/Rheumatoid Sister 3  . Endometriosis Sister   . Retinitis pigmentosa Sister 75  . Ulcerative colitis Sister 21        surgical repair, J pouch  . Ovarian cancer Sister 71       negative genetic testing  . Liver disease Maternal Aunt        NASH  . Colon cancer Neg Hx   . Esophageal cancer Neg Hx   . Pancreatic cancer Neg Hx   . Stomach cancer Neg Hx     ALLERGIES:  is allergic to oseltamivir phosphate and tamiflu [oseltamivir phosphate].  MEDICATIONS:  Current Outpatient Medications  Medication Sig Dispense Refill  . amphetamine-dextroamphetamine (ADDERALL) 10 MG tablet Take 10 mg by mouth 2 (two) times daily as needed.    . Cholecalciferol (VITAMIN D3) 125 MCG (5000 UT) CAPS Take 1 capsule by mouth daily.    Marland Kitchen EVEKEO 10 MG TABS Take 10 mg by mouth 2 (two) times daily.   0  . L-THEANINE PO Take by mouth. Takes 1 tablet daily    . levonorgestrel (MIRENA) 20 MCG/24HR IUD 1 each by Intrauterine route once. Mirena IUD.    Marland Kitchen SIMPONI 50 MG/0.5ML SOAJ INJECT 50 MG INTO THE SKIN EVERY 28 (TWENTY-EIGHT) DAYS. 0.5 mL 2   No current facility-administered medications for this visit.    REVIEW OF SYSTEMS:   Constitutional: ( - ) fevers, ( - )  chills , ( - ) night sweats Eyes: ( - ) blurriness of vision, ( - ) double vision, ( - ) watery eyes Ears, nose, mouth, throat, and face: ( - ) mucositis, ( - ) sore throat Respiratory: ( - ) cough, ( - ) dyspnea, ( - ) wheezes Cardiovascular: ( - ) palpitation, ( - ) chest discomfort, ( - ) lower extremity swelling Gastrointestinal:  ( - ) nausea, ( - ) heartburn, ( - ) change in bowel habits Skin: ( - ) abnormal skin rashes Lymphatics: ( - ) new lymphadenopathy, ( - ) easy bruising Neurological: ( - ) numbness, ( - ) tingling, ( - ) new weaknesses Behavioral/Psych: ( - ) mood change, ( - ) new changes  All other systems were reviewed with the patient and are negative.  PHYSICAL EXAMINATION:  Vitals:   11/14/20 1039  BP: 94/64  Pulse: 66  Resp: 18  Temp: 98.1 F (36.7 C)  SpO2: 100%   Filed Weights   11/14/20 1039  Weight: 147 lb 3.2 oz (66.8 kg)     GENERAL: well appearing middle aged Caucasian female. alert, no distress and comfortable SKIN: skin color, texture, turgor are normal, no rashes or significant lesions EYES: conjunctiva are pink and non-injected, sclera clear LUNGS: clear to auscultation and percussion with normal breathing effort HEART: regular rate & rhythm and no murmurs and no lower extremity edema Musculoskeletal: no cyanosis  of digits and no clubbing  PSYCH: alert & oriented x 3, fluent speech NEURO: no focal motor/sensory deficits  LABORATORY DATA:  I have reviewed the data as listed CBC Latest Ref Rng & Units 11/14/2020 09/25/2020 07/30/2020  WBC 4.0 - 10.5 K/uL 4.9 4.3 6.1  Hemoglobin 12.0 - 15.0 g/dL 14.8 15.0 13.3  Hematocrit 36.0 - 46.0 % 43.3 44.5 40.2  Platelets 150 - 400 K/uL 197 213 206    CMP Latest Ref Rng & Units 11/14/2020 09/25/2020 07/30/2020  Glucose 70 - 99 mg/dL 103(H) 86 103(H)  BUN 6 - 20 mg/dL '17 13 16  ' Creatinine 0.44 - 1.00 mg/dL 0.87 0.79 0.84  Sodium 135 - 145 mmol/L 137 138 139  Potassium 3.5 - 5.1 mmol/L 4.1 4.2 4.5  Chloride 98 - 111 mmol/L 104 101 103  CO2 22 - 32 mmol/L '26 30 31  ' Calcium 8.9 - 10.3 mg/dL 9.3 9.5 9.5  Total Protein 6.5 - 8.1 g/dL 6.8 6.8 6.2  Total Bilirubin 0.3 - 1.2 mg/dL 0.9 1.1 0.5  Alkaline Phos 38 - 126 U/L 47 - -  AST 15 - 41 U/L '23 24 19  ' ALT 0 - 44 U/L '17 13 10    ' Lab Results  Component Value Date   MPROTEIN 0.1 (H) 11/14/2020   MPROTEIN 0.2 (H) 05/02/2020   Lab Results  Component Value Date   KPAFRELGTCHN 16.7 11/14/2020   KPAFRELGTCHN 16.6 05/02/2020   LAMBDASER 13.8 11/14/2020   LAMBDASER 17.1 05/02/2020   KAPLAMBRATIO 1.21 11/14/2020   KAPLAMBRATIO >6.90 05/07/2020   KAPLAMBRATIO 0.97 05/02/2020    RADIOGRAPHIC STUDIES: No results found.  ASSESSMENT & PLAN Samantha Graves 49 y.o. female with medical history significant for IgM Kappa MGUS who presents for a follow up visit. After review the labs, the records, schedule the  patient the findings are most consistent with IgM kappa monoclonal gammopathy of undetermined significance. She is had extensive work-up including a bone marrow biopsy which has not revealed any plasma cell neoplasm. 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 She also had a ultrasound of her left axilla performed which showed a lymphadenopathy that was previously noted had subsequently resolved. All of these findings are reassuring the patient simply has an MGUS and no other underlying hematological malignancies. We'll plan to see her back in 6 months time for repeat evaluation and if everything is stable at that time we could proceed forward with every 12 month follow-up.  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 --repeat MGUS labs today to include SPEP, SFLC, LDH, and beta 2 microglobulin.  --RTC in 6 months time for a repeat visit. If labs are stable at that time can extend to q 12 month visits.   No orders of the defined types were placed in this encounter.   All questions were answered. The patient knows to call the clinic with any problems, questions or concerns.  A total of more than 30 minutes were spent on this encounter and over half of that time was spent on counseling and coordination of care as outlined above.   Ledell Peoples, MD Department of Hematology/Oncology [facility]  [facility] Phone: [phone] Pager: [phone] Email: [email or web address]  11/21/2020 11:50 AM

## 2020-11-24 ENCOUNTER — Telehealth: Payer: Self-pay | Admitting: Hematology and Oncology

## 2020-11-24 MED FILL — SIMPONI 50 MG/0.5ML SOAJ: 50 | 28 days supply | Qty: 1 | Fill #0

## 2020-11-24 NOTE — Telephone Encounter (Signed)
Left message regarding follow-up labs and visit per 12/27 schedule message. Gave option to call back to reschedule appointment.

## 2020-12-03 ENCOUNTER — Other Ambulatory Visit: Payer: BC Managed Care – PPO

## 2020-12-03 DIAGNOSIS — Z20822 Contact with and (suspected) exposure to covid-19: Secondary | ICD-10-CM

## 2020-12-04 LAB — NOVEL CORONAVIRUS, NAA: SARS-CoV-2, NAA: NOT DETECTED

## 2020-12-04 LAB — SARS-COV-2, NAA 2 DAY TAT

## 2021-01-01 ENCOUNTER — Telehealth: Payer: Self-pay | Admitting: Pharmacy Technician

## 2021-01-01 ENCOUNTER — Telehealth: Payer: Self-pay

## 2021-01-01 NOTE — Telephone Encounter (Signed)
Received message from [facility] that patient does not have any active insurance when trying to refill her Simponi.   Ran eligibility check and was unable to find any coverage.  Called patient, left message.

## 2021-01-01 NOTE — Telephone Encounter (Signed)
Patient called stating she was returning Rachael's call.   

## 2021-01-01 NOTE — Telephone Encounter (Signed)
Returned call, left message. Advised patient to call me back at [phone]

## 2021-01-05 NOTE — Telephone Encounter (Signed)
I spoke to patient. Her husband's plan is switching over to [place] and should be active again in about a week. Will keep checking eligibility.  Patient will reach out to clinic to see about a Simponi sample.

## 2021-01-05 NOTE — Telephone Encounter (Signed)
Spoke with patient and advised she may come to the office to pick up a sample. Sample reserved in the fridge for patient.

## 2021-01-06 NOTE — Progress Notes (Deleted)
Office Visit Note  Patient: Samantha Graves             Date of Birth: 1971-09-13           MRN: [id number]             PCP: Fay Records, MD Referring: Fay Records, MD Visit Date: 01/07/2021 Occupation: @GUAROCC @  Subjective:  Right hip pain   History of Present Illness: Samantha Graves is a 50 y.o. female with history of psoriatic arthritis.  She is on simponi 50 mg sq injections every month.    CBC and CMP updated on 11/14/20.  She will be due to update lab work in March and every 3 months.  Standing orders for CBC and CMP are in place.  TB gold negative on 04/15/20 and will continue to be monitored yearly.   Sample provided yesterday   Activities of Daily Living:  Patient reports morning stiffness for *** {minute/hour:19697}.   Patient {ACTIONS;DENIES/REPORTS:21021675::"Denies"} nocturnal pain.  Difficulty dressing/grooming: {ACTIONS;DENIES/REPORTS:21021675::"Denies"} Difficulty climbing stairs: {ACTIONS;DENIES/REPORTS:21021675::"Denies"} Difficulty getting out of chair: {ACTIONS;DENIES/REPORTS:21021675::"Denies"} Difficulty using hands for taps, buttons, cutlery, and/or writing: {ACTIONS;DENIES/REPORTS:21021675::"Denies"}  No Rheumatology ROS completed.   PMFS History:  Patient Active Problem List   Diagnosis Date Noted  . Psoriasis 01/11/2017  . High risk medication use 01/11/2017  . PSORIATIC ARTHRITIS 10/14/2009  . ALLERGIC RHINITIS 09/06/2007    Past Medical History:  Diagnosis Date  . ADHD   . Bile duct injury 2020   mild  . Cholecystitis   . Elevated LFTs   . PONV (postoperative nausea and vomiting)   . Psoriatic arthritis ([place])   . Rib fracture     Family History  Problem Relation Age of Onset  . Hyperlipidemia Mother   . Ovarian cysts Mother        benign  . Endometriosis Mother   . Liver disease Mother        NASH  . Other Father        auto imune  . Coronary artery disease Father        cardio ablation  . COPD Maternal  Grandmother   . Bone cancer Maternal Grandfather 8  . Arthritis/Rheumatoid Sister 56  . Endometriosis Sister   . Retinitis pigmentosa Sister 63  . Ulcerative colitis Sister 21       surgical repair, J pouch  . Ovarian cancer Sister 36       negative genetic testing  . Liver disease Maternal Aunt        NASH  . Colon cancer Neg Hx   . Esophageal cancer Neg Hx   . Pancreatic cancer Neg Hx   . Stomach cancer Neg Hx    Past Surgical History:  Procedure Laterality Date  . CHOLECYSTECTOMY N/A 08/13/2019   Procedure: LAPAROSCOPIC CHOLECYSTECTOMY;  Surgeon: Rolm Bookbinder, MD;  Location: [place];  Service: General;  Laterality: N/A;  . INTRAUTERINE DEVICE INSERTION  05/30/15, 05/06/10   MIrena IUD  . TONSILLECTOMY AND ADENOIDECTOMY  1990  . WISDOM TOOTH EXTRACTION Bilateral 1991   Social History   Social History Narrative  . Not on file   Immunization History  Administered Date(s) Administered  . Influenza Whole 08/30/2003  . Influenza,inj,Quad PF,6+ Mos 09/01/2015, 09/14/2019, 11/14/2020  . PFIZER(Purple Top)SARS-COV-2 Vaccination 02/09/2020, 03/03/2020  . Tdap 02/25/2011     Objective: Vital Signs: There were no vitals taken for this visit.   Physical Exam   Musculoskeletal Exam: ***  CDAI Exam: CDAI Score: --  Patient Global: --; Provider Global: -- Swollen: --; Tender: -- Joint Exam 01/07/2021   No joint exam has been documented for this visit   There is currently no information documented on the homunculus. Go to the Rheumatology activity and complete the homunculus joint exam.  Investigation: No additional findings.  Imaging: No results found.  Recent Labs: Lab Results  Component Value Date   WBC 4.9 11/14/2020   HGB 14.8 11/14/2020   PLT 197 11/14/2020   NA 137 11/14/2020   K 4.1 11/14/2020   CL 104 11/14/2020   CO2 26 11/14/2020   GLUCOSE 103 (H) 11/14/2020   BUN 17 11/14/2020   CREATININE 0.87 11/14/2020   BILITOT 0.9 11/14/2020   ALKPHOS 47  11/14/2020   AST 23 11/14/2020   ALT 17 11/14/2020   PROT 6.8 11/14/2020   ALBUMIN 4.0 11/14/2020   CALCIUM 9.3 11/14/2020   GFRAA 103 09/25/2020   QFTBGOLD NEGATIVE 11/04/2017   QFTBGOLDPLUS NEGATIVE 04/15/2020    Speciality Comments: No specialty comments available.  Procedures:  No procedures performed Allergies: Oseltamivir phosphate and Tamiflu [oseltamivir phosphate]   Assessment / Plan:     Visit Diagnoses: Psoriatic arthritis ([place])  Psoriasis  High risk medication use - Simponi sq injections once monthly   Chronic SI joint pain  Trochanteric bursitis of both hips  Elevated LFTs  Orders: No orders of the defined types were placed in this encounter.  No orders of the defined types were placed in this encounter.   Face-to-face time spent with patient was *** minutes. Greater than 50% of time was spent in counseling and coordination of care.  Follow-Up Instructions: No follow-ups on file.   Ofilia Neas, PA-C  Note - This record has been created using Dragon software.  Chart creation errors have been sought, but may not always  have been located. Such creation errors do not reflect on  the standard of medical care.

## 2021-01-06 NOTE — Telephone Encounter (Signed)
Medication Samples have been provided to the patient.  Drug name: Simponi 50mg /0.35mL Strength: 50mg /0.47mL Qty: 1 pen LOT: [id number] Exp.Date: 09/28/2022  Cassandria Anger 11:22 AM 01/06/2021

## 2021-01-07 ENCOUNTER — Ambulatory Visit: Payer: BC Managed Care – PPO | Admitting: Physician Assistant

## 2021-01-07 DIAGNOSIS — M7061 Trochanteric bursitis, right hip: Secondary | ICD-10-CM

## 2021-01-07 DIAGNOSIS — Z79899 Other long term (current) drug therapy: Secondary | ICD-10-CM

## 2021-01-07 DIAGNOSIS — L409 Psoriasis, unspecified: Secondary | ICD-10-CM

## 2021-01-07 DIAGNOSIS — G8929 Other chronic pain: Secondary | ICD-10-CM

## 2021-01-07 DIAGNOSIS — L4050 Arthropathic psoriasis, unspecified: Secondary | ICD-10-CM

## 2021-01-07 DIAGNOSIS — R7989 Other specified abnormal findings of blood chemistry: Secondary | ICD-10-CM

## 2021-01-08 NOTE — Telephone Encounter (Signed)
Still no active coverage found, will keep checking

## 2021-01-12 NOTE — Telephone Encounter (Signed)
Still no active coverage found, will keep checking

## 2021-01-14 NOTE — Telephone Encounter (Signed)
Still no active coverage found

## 2021-01-16 NOTE — Telephone Encounter (Signed)
Patient's BCBS[place] is active again- new PA is needed.  Submitted a Prior Authorization request to [facility] for [facility] via Cover My Meds. Will update once we receive a response.   Key: [id number]

## 2021-01-16 NOTE — Telephone Encounter (Signed)
Received fax of additional clinical questions. Completed and faxed back to [facility]. Awaiting response.

## 2021-01-19 MED FILL — SIMPONI 50 MG/0.5ML SOAJ: 50 | 28 days supply | Qty: 1 | Fill #1

## 2021-01-19 NOTE — Telephone Encounter (Addendum)
Received notification from [facility] regarding a prior authorization for [facility]. Authorization has been APPROVED from 01/16/21 to 01/15/22.   Authorization # [id number]  Copay for 28 days is $2501.65. Copay savings card information as follows: BIN: [id number] GROUP: [id number] ID: [id number]  Emailed [facility] to notify along with copay card information.  Knox Saliva, PharmD, MPH Clinical Pharmacist (Rheumatology and Pulmonology)

## 2021-01-29 ENCOUNTER — Other Ambulatory Visit: Payer: Self-pay

## 2021-01-29 DIAGNOSIS — R7989 Other specified abnormal findings of blood chemistry: Secondary | ICD-10-CM

## 2021-02-09 ENCOUNTER — Ambulatory Visit (INDEPENDENT_AMBULATORY_CARE_PROVIDER_SITE_OTHER): Payer: BC Managed Care – PPO | Admitting: Obstetrics and Gynecology

## 2021-02-09 ENCOUNTER — Encounter: Payer: Self-pay | Admitting: Obstetrics and Gynecology

## 2021-02-09 ENCOUNTER — Other Ambulatory Visit: Payer: Self-pay

## 2021-02-09 VITALS — BP 134/74 | HR 62 | Ht 67.5 in | Wt 154.0 lb

## 2021-02-09 DIAGNOSIS — Z8041 Family history of malignant neoplasm of ovary: Secondary | ICD-10-CM

## 2021-02-09 DIAGNOSIS — Z01419 Encounter for gynecological examination (general) (routine) without abnormal findings: Secondary | ICD-10-CM

## 2021-02-09 NOTE — Progress Notes (Signed)
50 y.o. [id number] Married Caucasian female here for annual exam.    Periods started again in the last few months.  Mild cramping with her cycle recently.   Has tenderness of her left axillary node.   Dx with MGUS.  Followed at the [facility].  Will do a bone scan in June.   Vaccinated against Covid.   Running again.  Just back from [place].   PCP: None  Patient's last menstrual period was 01/27/2021 (approximate).     Period Cycle (Days): 30 Period Duration (Days): 2-3 Period Pattern: Regular Menstrual Flow: Light Menstrual Control:  (nothing) Dysmenorrhea: (!) Mild Dysmenorrhea Symptoms: Cramping      Sexually active: Yes.    The current method of family planning is IUD--Mirena 05-30-15 .    Exercising: Yes.    running Smoker:  no  Health Maintenance: Pap: 02-05-20 Neg:Neg HR HPV, 07-09-15 Neg:Neg HR HPV, 03-31-12 Neg:Neg HR HPV History of abnormal Pap:  no MMG: 09-26-20 [place] of Lt.axilla/Interval decrease in size and prominence of a solitary left axillary lymph node. No further follow-up required/due annual bil.screening 01/2021/BiRads2 Colonoscopy:  n/a BMD:   n/a  Result  n/a TDaP:  02-16-11 Gardasil:   no HIV: Neg with preg Hep C: 06-19-19 Neg Screening Labs:  Oncology and rheumatology.   reports that she has never smoked. She has never used smokeless tobacco. She reports current alcohol use of about 3.0 - 4.0 standard drinks of alcohol per week. She reports that she does not use drugs.  Past Medical History:  Diagnosis Date  . ADHD   . Bile duct injury 2020   mild  . Cholecystitis   . Elevated LFTs   . MGUS (monoclonal gammopathy of unknown significance) 2021  . PONV (postoperative nausea and vomiting)   . Psoriatic arthritis ([place])   . Rib fracture     Past Surgical History:  Procedure Laterality Date  . CHOLECYSTECTOMY N/A 08/13/2019   Procedure: LAPAROSCOPIC CHOLECYSTECTOMY;  Surgeon: Rolm Bookbinder, MD;  Location: [place];  Service:  General;  Laterality: N/A;  . INTRAUTERINE DEVICE INSERTION  05/30/15, 05/06/10   MIrena IUD  . TONSILLECTOMY AND ADENOIDECTOMY  1990  . WISDOM TOOTH EXTRACTION Bilateral 1991    Current Outpatient Medications  Medication Sig Dispense Refill  . Cholecalciferol (VITAMIN D3) 125 MCG (5000 UT) CAPS Take 1 capsule by mouth daily.    Marland Kitchen EVEKEO 10 MG TABS Take 10 mg by mouth 2 (two) times daily.   0  . L-THEANINE PO Take by mouth. Takes 1 tablet daily    . levonorgestrel (MIRENA) 20 MCG/24HR IUD 1 each by Intrauterine route once. Mirena IUD.    Marland Kitchen SIMPONI 50 MG/0.5ML SOAJ INJECT 50 MG INTO THE SKIN EVERY 28 (TWENTY-EIGHT) DAYS. 0.5 mL 2  . amphetamine-dextroamphetamine (ADDERALL) 10 MG tablet Take 10 mg by mouth 2 (two) times daily as needed.     No current facility-administered medications for this visit.    Family History  Problem Relation Age of Onset  . Hyperlipidemia Mother   . Ovarian cysts Mother        benign  . Endometriosis Mother   . Liver disease Mother        NASH  . Other Father        auto imune  . Coronary artery disease Father        cardio ablation  . COPD Maternal Grandmother   . Bone cancer Maternal Grandfather 13  . Arthritis/Rheumatoid Sister 59  .  Endometriosis Sister   . Retinitis pigmentosa Sister 23  . Ulcerative colitis Sister 21       surgical repair, J pouch  . Ovarian cancer Sister 55       negative genetic testing  . Liver disease Maternal Aunt        NASH  . Colon cancer Neg Hx   . Esophageal cancer Neg Hx   . Pancreatic cancer Neg Hx   . Stomach cancer Neg Hx     Review of Systems  All other systems reviewed and are negative.   Exam:   BP 134/74   Pulse 62   Ht 5' 7.5" (1.715 m)   Wt 154 lb (69.9 kg)   LMP 01/27/2021 (Approximate)   SpO2 100%   BMI 23.76 kg/m     General appearance: alert, cooperative and appears stated age Head: normocephalic, without obvious abnormality, atraumatic Neck: no adenopathy, supple, symmetrical, trachea  midline and thyroid normal to inspection and palpation Lungs: clear to auscultation bilaterally Breasts: normal appearance, no masses or tenderness, No nipple retraction or dimpling, No nipple discharge or bleeding, No axillary adenopathy Heart: regular rate and rhythm Abdomen: soft, non-tender; no masses, no organomegaly Extremities: extremities normal, atraumatic, no cyanosis or edema Skin: skin color, texture, turgor normal. No rashes or lesions Lymph nodes: cervical, supraclavicular, and axillary nodes normal. Neurologic: grossly normal  Pelvic: External genitalia:  no lesions              No abnormal inguinal nodes palpated.              Urethra:  normal appearing urethra with no masses, tenderness or lesions              Bartholins and Skenes: normal                 Vagina: normal appearing vagina with normal color and discharge, no lesions              Cervix: no lesions.. IUD strings noted.               Pap taken: No. Bimanual Exam:  Uterus:  normal size, contour, position, consistency, mobility, non-tender              Adnexa: no mass, fullness, tenderness              Rectal exam: Yes.  .  Confirms.              Anus:  normal sphincter tone, no lesions  Chaperone was present for exam.  Assessment:   Well woman visit with normal exam. FH ovarian cancer in sister.  Sister had negative genetic testing.  Mirena IUD.  MGUS.   Plan: Mammogram screening discussed.  She will schedule mammogram and left axillary [place] at [facility].   She is due for follow up.  Self breast awareness reviewed. Pap and HR HPV as above. Guidelines for Calcium, Vitamin D, regular exercise program including cardiovascular and weight bearing exercise. We discussed 7 year usage of Mirena IUD.   She will let me know if she would like to exchange her IUD before 7 years.  Return for pelvic US.  We discussed genetic counseling and testing for herself.  She will ask her oncologist.  Labs with rheumatology  and oncology.  Follow up annually and prn.

## 2021-02-09 NOTE — Patient Instructions (Signed)

## 2021-02-13 ENCOUNTER — Telehealth: Payer: Self-pay

## 2021-02-13 NOTE — Telephone Encounter (Signed)
Spoke with patient regarding benefits for scheduled Ultrasound. Patient acknowledges understanding of information presented. Encounter closed.

## 2021-02-17 ENCOUNTER — Ambulatory Visit (INDEPENDENT_AMBULATORY_CARE_PROVIDER_SITE_OTHER): Payer: BC Managed Care – PPO

## 2021-02-17 ENCOUNTER — Other Ambulatory Visit: Payer: Self-pay

## 2021-02-17 ENCOUNTER — Ambulatory Visit (INDEPENDENT_AMBULATORY_CARE_PROVIDER_SITE_OTHER): Payer: BC Managed Care – PPO | Admitting: Obstetrics and Gynecology

## 2021-02-17 ENCOUNTER — Encounter: Payer: Self-pay | Admitting: Obstetrics and Gynecology

## 2021-02-17 VITALS — BP 126/68 | HR 64 | Ht 67.5 in | Wt 154.0 lb

## 2021-02-17 DIAGNOSIS — Z23 Encounter for immunization: Secondary | ICD-10-CM

## 2021-02-17 DIAGNOSIS — Z8041 Family history of malignant neoplasm of ovary: Secondary | ICD-10-CM

## 2021-02-17 DIAGNOSIS — Z30431 Encounter for routine checking of intrauterine contraceptive device: Secondary | ICD-10-CM | POA: Diagnosis not present

## 2021-02-17 NOTE — Patient Instructions (Signed)
Tdap (Tetanus, Diphtheria, Pertussis) Vaccine: What You Need to Know 1. Why get vaccinated? Tdap vaccine can prevent tetanus, diphtheria, and pertussis. Diphtheria and pertussis spread from person to person. Tetanus enters the body through cuts or wounds.  TETANUS (T) causes painful stiffening of the muscles. Tetanus can lead to serious health problems, including being unable to open the mouth, having trouble swallowing and breathing, or death.  DIPHTHERIA (D) can lead to difficulty breathing, heart failure, paralysis, or death.  PERTUSSIS (aP), also known as "whooping cough," can cause uncontrollable, violent coughing that makes it hard to breathe, eat, or drink. Pertussis can be extremely serious especially in babies and young children, causing pneumonia, convulsions, brain damage, or death. In teens and adults, it can cause weight loss, loss of bladder control, passing out, and rib fractures from severe coughing. 2. Tdap vaccine Tdap is only for children 7 years and older, adolescents, and adults.  Adolescents should receive a single dose of Tdap, preferably at age 11 or 12 years. Pregnant people should get a dose of Tdap during every pregnancy, preferably during the early part of the third trimester, to help protect the newborn from pertussis. Infants are most at risk for severe, life-threatening complications from pertussis. Adults who have never received Tdap should get a dose of Tdap. Also, adults should receive a booster dose of either Tdap or Td (a different vaccine that protects against tetanus and diphtheria but not pertussis) every 10 years, or after 5 years in the case of a severe or dirty wound or burn. Tdap may be given at the same time as other vaccines. 3. Talk with your health care provider Tell your vaccine provider if the person getting the vaccine:  Has had an allergic reaction after a previous dose of any vaccine that protects against tetanus, diphtheria, or pertussis, or  has any severe, life-threatening allergies  Has had a coma, decreased level of consciousness, or prolonged seizures within 7 days after a previous dose of any pertussis vaccine (DTP, DTaP, or Tdap)  Has seizures or another nervous system problem  Has ever had Guillain-Barr Syndrome (also called "GBS")  Has had severe pain or swelling after a previous dose of any vaccine that protects against tetanus or diphtheria In some cases, your health care provider may decide to postpone Tdap vaccination until a future visit. People with minor illnesses, such as a cold, may be vaccinated. People who are moderately or severely ill should usually wait until they recover before getting Tdap vaccine.  Your health care provider can give you more information. 4. Risks of a vaccine reaction  Pain, redness, or swelling where the shot was given, mild fever, headache, feeling tired, and nausea, vomiting, diarrhea, or stomachache sometimes happen after Tdap vaccination. People sometimes faint after medical procedures, including vaccination. Tell your provider if you feel dizzy or have vision changes or ringing in the ears.  As with any medicine, there is a very remote chance of a vaccine causing a severe allergic reaction, other serious injury, or death. 5. What if there is a serious problem? An allergic reaction could occur after the vaccinated person leaves the clinic. If you see signs of a severe allergic reaction (hives, swelling of the face and throat, difficulty breathing, a fast heartbeat, dizziness, or weakness), call 9-1-1 and get the person to the nearest hospital. For other signs that concern you, call your health care provider.  Adverse reactions should be reported to the Vaccine Adverse Event Reporting System (VAERS). Your health   care provider will usually file this report, or you can do it yourself. Visit the VAERS website at www.vaers.hhs.gov or call 1-800-822-7967. VAERS is only for reporting  reactions, and VAERS staff members do not give medical advice. 6. The National Vaccine Injury Compensation Program The National Vaccine Injury Compensation Program (VICP) is a federal program that was created to compensate people who may have been injured by certain vaccines. Claims regarding alleged injury or death due to vaccination have a time limit for filing, which may be as short as two years. Visit the VICP website at www.hrsa.gov/vaccinecompensation or call 1-800-338-2382 to learn about the program and about filing a claim. 7. How can I learn more?  Ask your health care provider.  Call your local or state health department.  Visit the website of the Food and Drug Administration (FDA) for vaccine package inserts and additional information at www.fda.gov/vaccines-blood-biologics/vaccines.  Contact the Centers for Disease Control and Prevention (CDC): ? Call 1-800-232-4636 (1-800-CDC-INFO) or ? Visit CDC's website at www.cdc.gov/vaccines. Vaccine Information Statement Tdap (Tetanus, Diphtheria, Pertussis) Vaccine (07/04/2020) This information is not intended to replace advice given to you by your health care provider. Make sure you discuss any questions you have with your health care provider. Document Revised: 07/30/2020 Document Reviewed: 07/30/2020 Elsevier Patient Education  2021 Elsevier Inc.  

## 2021-02-17 NOTE — Progress Notes (Signed)
GYNECOLOGY  VISIT   HPI: 50 y.o.   Married  Caucasian  female   [id number] with Patient's last menstrual period was 01/27/2021 (approximate).   here for pelvic ultrasound.   FH of ovarian cancer.   Just accepted into nursing school.   Needs immunization records. Would like to update TDap.  GYNECOLOGIC HISTORY: Patient's last menstrual period was 01/27/2021 (approximate). Contraception:  Mirena IUD 05-30-15 Menopausal hormone therapy: none Last mammogram: 09-26-20 [place] of [place]/Interval decrease in size and prominence of a solitary left axillary lymph node. No further follow-up required/due annual bil.screening 01/2021/BiRads2 Last pap smear: 02-05-20 Neg:Neg HR HPV, 07-09-15 Neg:Neg HR HPV, 03-31-12 Neg:Neg HR HPV        OB History    Gravida  4   Para  4   Term  4   Preterm  0   AB  0   Living  4     SAB  0   IAB  0   Ectopic  0   Multiple  0   Live Births  4              Patient Active Problem List   Diagnosis Date Noted  . Psoriasis 01/11/2017  . High risk medication use 01/11/2017  . PSORIATIC ARTHRITIS 10/14/2009  . ALLERGIC RHINITIS 09/06/2007    Past Medical History:  Diagnosis Date  . ADHD   . Bile duct injury 2020   mild  . Cholecystitis   . Elevated LFTs   . MGUS (monoclonal gammopathy of unknown significance) 2021  . PONV (postoperative nausea and vomiting)   . Psoriatic arthritis ([place])   . Rib fracture     Past Surgical History:  Procedure Laterality Date  . CHOLECYSTECTOMY N/A 08/13/2019   Procedure: LAPAROSCOPIC CHOLECYSTECTOMY;  Surgeon: Rolm Bookbinder, MD;  Location: [facility];  Service: General;  Laterality: N/A;  . INTRAUTERINE DEVICE INSERTION  05/30/15, 05/06/10   MIrena IUD  . TONSILLECTOMY AND ADENOIDECTOMY  1990  . WISDOM TOOTH EXTRACTION Bilateral 1991    Current Outpatient Medications  Medication Sig Dispense Refill  . Cholecalciferol (VITAMIN D3) 125 MCG (5000 UT) CAPS Take 1 capsule by mouth daily.    Marland Kitchen EVEKEO 10 MG  TABS Take 10 mg by mouth 2 (two) times daily.   0  . L-THEANINE PO Take by mouth. Takes 1 tablet daily    . levonorgestrel (MIRENA) 20 MCG/24HR IUD 1 each by Intrauterine route once. Mirena IUD.    Marland Kitchen SIMPONI 50 MG/0.5ML SOAJ INJECT 50 MG INTO THE SKIN EVERY 28 (TWENTY-EIGHT) DAYS. 0.5 mL 2  . amphetamine-dextroamphetamine (ADDERALL) 10 MG tablet Take 10 mg by mouth 2 (two) times daily as needed.     No current facility-administered medications for this visit.     ALLERGIES: Tamiflu [oseltamivir phosphate]  Family History  Problem Relation Age of Onset  . Hyperlipidemia Mother   . Ovarian cysts Mother        benign  . Endometriosis Mother   . Liver disease Mother        NASH  . Other Father        auto imune  . Coronary artery disease Father        cardio ablation  . COPD Maternal Grandmother   . Bone cancer Maternal Grandfather 68  . Arthritis/Rheumatoid Sister 62  . Endometriosis Sister   . Retinitis pigmentosa Sister 36  . Ulcerative colitis Sister 21       surgical repair, J pouch  . Ovarian  cancer Sister 31       negative genetic testing  . Liver disease Maternal Aunt        NASH  . Colon cancer Neg Hx   . Esophageal cancer Neg Hx   . Pancreatic cancer Neg Hx   . Stomach cancer Neg Hx     Social History   Socioeconomic History  . Marital status: Married    Spouse name: Not on file  . Number of children: Not on file  . Years of education: Not on file  . Highest education level: Not on file  Occupational History  . Not on file  Tobacco Use  . Smoking status: Never Smoker  . Smokeless tobacco: Never Used  Vaping Use  . Vaping Use: Never used  Substance and Sexual Activity  . Alcohol use: Yes    Alcohol/week: 3.0 - 4.0 standard drinks    Types: 3 - 4 Glasses of wine per week    Comment: none since 06/30/2019 over the last 3 months has up to 3 glasses per week only as of 06/26/19  . Drug use: No  . Sexual activity: Yes    Birth control/protection: I.U.D.     Comment: Mirena 05-30-15  Other Topics Concern  . Not on file  Social History Narrative  . Not on file   Social Determinants of Health   Financial Resource Strain: Not on file  Food Insecurity: Not on file  Transportation Needs: Not on file  Physical Activity: Not on file  Stress: Not on file  Social Connections: Not on file  Intimate Partner Violence: Not on file    Review of Systems  All other systems reviewed and are negative.   PHYSICAL EXAMINATION:    BP 126/68   Pulse 64   Ht 5' 7.5" (1.715 m)   Wt 154 lb (69.9 kg)   LMP 01/27/2021 (Approximate)   SpO2 99%   BMI 23.76 kg/m     General appearance: alert, cooperative and appears stated age   Pelvic US Uterus no myometrial masses.  EMS 2.95 mm.  IUD in endometrial canal.  Ovaries normal with normal follicle pattern and normal perfusion bilaterally.  No adnexal masses.  No free fluid.   ASSESSMENT  FH ovarian cancer in sister, who had negative genetic testing.  No evidence of ovarian malignancy. IUD check up.  Update of vaccines.   PLAN  US findings and report reviewed with patient.  Reassurance given regarding pelvic anatomy and position of IUD.  Signs and symptoms of ovarian cancer reviewed.  Will do periodic pelvic ultrasound, which can be done yearly.  CA125 not recommended due to false positives. TDap.  FU for annual exam and prn.  20 min  total time was spent for this patient encounter, including preparation, face-to-face counseling with the patient, coordination of care, and documentation of the encounter.

## 2021-02-19 ENCOUNTER — Other Ambulatory Visit (HOSPITAL_COMMUNITY): Payer: Self-pay

## 2021-02-26 MED FILL — SIMPONI 50 MG/0.5ML SOAJ: 50 | 28 days supply | Qty: 1 | Fill #2

## 2021-03-04 ENCOUNTER — Telehealth: Payer: Self-pay

## 2021-03-04 ENCOUNTER — Other Ambulatory Visit: Payer: Self-pay | Admitting: *Deleted

## 2021-03-04 DIAGNOSIS — L4050 Arthropathic psoriasis, unspecified: Secondary | ICD-10-CM

## 2021-03-04 DIAGNOSIS — L409 Psoriasis, unspecified: Secondary | ICD-10-CM

## 2021-03-04 DIAGNOSIS — Z79899 Other long term (current) drug therapy: Secondary | ICD-10-CM

## 2021-03-04 NOTE — Telephone Encounter (Signed)
Patient called requesting a copy of her immunization records.

## 2021-03-04 NOTE — Telephone Encounter (Signed)
I called patient, printed report from Epic, report at front desk for patient to pick up, patient needs labs and appt,

## 2021-03-10 DIAGNOSIS — Z1152 Encounter for screening for COVID-19: Secondary | ICD-10-CM | POA: Diagnosis not present

## 2021-03-16 ENCOUNTER — Other Ambulatory Visit: Payer: Self-pay | Admitting: *Deleted

## 2021-03-16 DIAGNOSIS — Z79899 Other long term (current) drug therapy: Secondary | ICD-10-CM

## 2021-03-16 DIAGNOSIS — L409 Psoriasis, unspecified: Secondary | ICD-10-CM

## 2021-03-16 DIAGNOSIS — L4050 Arthropathic psoriasis, unspecified: Secondary | ICD-10-CM | POA: Diagnosis not present

## 2021-03-17 ENCOUNTER — Other Ambulatory Visit (INDEPENDENT_AMBULATORY_CARE_PROVIDER_SITE_OTHER): Payer: Self-pay

## 2021-03-17 DIAGNOSIS — Z23 Encounter for immunization: Secondary | ICD-10-CM | POA: Diagnosis not present

## 2021-03-17 DIAGNOSIS — R7989 Other specified abnormal findings of blood chemistry: Secondary | ICD-10-CM

## 2021-03-17 LAB — HEPATIC FUNCTION PANEL
ALT: 16 U/L (ref 0–35)
AST: 27 U/L (ref 0–37)
Albumin: 4.2 g/dL (ref 3.5–5.2)
Alkaline Phosphatase: 41 U/L (ref 39–117)
Bilirubin, Direct: 0.1 mg/dL (ref 0.0–0.3)
Total Bilirubin: 0.7 mg/dL (ref 0.2–1.2)
Total Protein: 6.6 g/dL (ref 6.0–8.3)

## 2021-03-18 LAB — COMPLETE METABOLIC PANEL WITH GFR
AG Ratio: 2 (calc) (ref 1.0–2.5)
ALT: 13 U/L (ref 6–29)
AST: 23 U/L (ref 10–35)
Albumin: 4.3 g/dL (ref 3.6–5.1)
Alkaline phosphatase (APISO): 43 U/L (ref 31–125)
BUN: 16 mg/dL (ref 7–25)
CO2: 28 mmol/L (ref 20–32)
Calcium: 9.1 mg/dL (ref 8.6–10.2)
Chloride: 103 mmol/L (ref 98–110)
Creat: 0.76 mg/dL (ref 0.50–1.10)
GFR, Est African American: 107 mL/min/{1.73_m2} (ref >=60)
GFR, Est Non African American: 92 mL/min/{1.73_m2} (ref >=60)
Globulin: 2.1 g/dL (calc) (ref 1.9–3.7)
Glucose, Bld: 97 mg/dL (ref 65–139)
Potassium: 4.2 mmol/L (ref 3.5–5.3)
Sodium: 139 mmol/L (ref 135–146)
Total Bilirubin: 0.5 mg/dL (ref 0.2–1.2)
Total Protein: 6.4 g/dL (ref 6.1–8.1)

## 2021-03-18 LAB — QUANTIFERON-TB GOLD PLUS
Mitogen-NIL: >10 IU/mL
NIL: 0.08 IU/mL
QuantiFERON-TB Gold Plus: NEGATIVE
TB1-NIL: <0 IU/mL
TB2-NIL: <0 IU/mL

## 2021-03-18 LAB — CBC WITH DIFFERENTIAL/PLATELET
Absolute Monocytes: 462 cells/uL (ref 200–950)
Basophils Absolute: 42 cells/uL (ref 0–200)
Basophils Relative: 0.7 %
Eosinophils Absolute: 120 cells/uL (ref 15–500)
Eosinophils Relative: 2 %
HCT: 40.2 % (ref 35.0–45.0)
Hemoglobin: 13.5 g/dL (ref 11.7–15.5)
Lymphs Abs: 1794 cells/uL (ref 850–3900)
MCH: 31.8 pg (ref 27.0–33.0)
MCHC: 33.6 g/dL (ref 32.0–36.0)
MCV: 94.6 fL (ref 80.0–100.0)
MPV: 11.1 fL (ref 7.5–12.5)
Monocytes Relative: 7.7 %
Neutro Abs: 3582 cells/uL (ref 1500–7800)
Neutrophils Relative %: 59.7 %
Platelets: 202 10*3/uL (ref 140–400)
RBC: 4.25 10*6/uL (ref 3.80–5.10)
RDW: 11.5 % (ref 11.0–15.0)
Total Lymphocyte: 29.9 %
WBC: 6 10*3/uL (ref 3.8–10.8)

## 2021-03-23 ENCOUNTER — Other Ambulatory Visit (HOSPITAL_COMMUNITY): Payer: Self-pay

## 2021-03-23 ENCOUNTER — Other Ambulatory Visit: Payer: Self-pay | Admitting: Rheumatology

## 2021-03-23 DIAGNOSIS — L4050 Arthropathic psoriasis, unspecified: Secondary | ICD-10-CM

## 2021-03-23 MED ORDER — SIMPONI 50 MG/0.5ML ~~LOC~~ SOAJ
SUBCUTANEOUS | 2 refills | Status: DC
  Filled 2021-03-23 (×2): qty 0.5, 28d supply, fill #0
  Filled 2021-04-28: qty 0.5, 28d supply, fill #1
  Filled 2021-06-04: qty 0.5, 28d supply, fill #2

## 2021-03-23 NOTE — Telephone Encounter (Signed)
Next Visit: was due in 11/2020. Message sent to the front desk to schedule.   Last Visit: 09/25/2020  Last Fill: 11/18/2020  TR:[id number] arthritis  Current Dose per office note on 09/25/2020: Simponi 50 mg sq injections once every 28 days  Labs: 03/16/2021 WNL   TB Gold:  03/16/2021 negative   Okay to refill simponi?

## 2021-03-25 ENCOUNTER — Other Ambulatory Visit (HOSPITAL_COMMUNITY): Payer: Self-pay

## 2021-04-01 ENCOUNTER — Ambulatory Visit (INDEPENDENT_AMBULATORY_CARE_PROVIDER_SITE_OTHER): Payer: BC Managed Care – PPO | Admitting: Physician Assistant

## 2021-04-01 ENCOUNTER — Encounter: Payer: Self-pay | Admitting: Physician Assistant

## 2021-04-01 ENCOUNTER — Other Ambulatory Visit: Payer: Self-pay

## 2021-04-01 VITALS — BP 107/63 | HR 68 | Resp 14 | Ht 67.25 in | Wt 153.0 lb

## 2021-04-01 DIAGNOSIS — Z23 Encounter for immunization: Secondary | ICD-10-CM

## 2021-04-01 DIAGNOSIS — G8929 Other chronic pain: Secondary | ICD-10-CM

## 2021-04-01 DIAGNOSIS — M722 Plantar fascial fibromatosis: Secondary | ICD-10-CM

## 2021-04-01 DIAGNOSIS — L409 Psoriasis, unspecified: Secondary | ICD-10-CM | POA: Diagnosis not present

## 2021-04-01 DIAGNOSIS — M533 Sacrococcygeal disorders, not elsewhere classified: Secondary | ICD-10-CM

## 2021-04-01 DIAGNOSIS — M7062 Trochanteric bursitis, left hip: Secondary | ICD-10-CM

## 2021-04-01 DIAGNOSIS — L4050 Arthropathic psoriasis, unspecified: Secondary | ICD-10-CM | POA: Diagnosis not present

## 2021-04-01 DIAGNOSIS — Z79899 Other long term (current) drug therapy: Secondary | ICD-10-CM | POA: Diagnosis not present

## 2021-04-01 DIAGNOSIS — L603 Nail dystrophy: Secondary | ICD-10-CM

## 2021-04-01 DIAGNOSIS — M7061 Trochanteric bursitis, right hip: Secondary | ICD-10-CM

## 2021-04-01 DIAGNOSIS — R7989 Other specified abnormal findings of blood chemistry: Secondary | ICD-10-CM

## 2021-04-01 DIAGNOSIS — M542 Cervicalgia: Secondary | ICD-10-CM

## 2021-04-01 LAB — SEDIMENTATION RATE: Sed Rate: 2 mm/h (ref 0–20)

## 2021-04-01 MED ORDER — CLOBETASOL PROPIONATE 0.05 % EX SHAM: MEDICATED_SHAMPOO | CUTANEOUS | 0 refills | Status: DC

## 2021-04-01 MED ORDER — METHOCARBAMOL 500 MG PO TABS
500.0000 mg | ORAL_TABLET | Freq: Every day | ORAL | 0 refills | Status: DC | PRN
Start: 2021-04-01 — End: 2021-11-16

## 2021-04-01 NOTE — Progress Notes (Addendum)
Office Visit Note  Patient: Samantha Graves             Date of Birth: 01-22-1971           MRN: [id number]             PCP: Fay Records, MD Referring: Fay Records, MD Visit Date: 04/01/2021 Occupation: '@GUAROCC' @  Subjective:  Nail changes   History of Present Illness: Samantha Graves is a 50 y.o. female with history of psoriatic arthritis.  She is on simponi 50 mg sq injections every 28 days, which was started in September 2021.  According to the patient she noticed significant improvement initially after starting on Simponi but over the past several months she has noticed a waning effect after each injection.  She usually experiences increased pain 2 weeks leading up to her next injection.  She states that most of her discomfort is soft tissue in origin.  She has been experiencing increased pain in the neck and both hips.  She denies any groin pain currently.  She has also been experiencing increased discomfort due to plantar fasciitis in both feet.  She denies any Achilles tendinitis.  She is not having any SI joint discomfort currently. She denies any joint swelling.  She takes diclofenac tablets very sparingly for pain relief. She has also noticed nail changes including thinning and ridging since switching from humira to simponi.  She has also noticed increased psoriasis on her scalp.  She has not seen her dermatologist recently.      Activities of Daily Living:  Patient reports morning stiffness for 2-3 hours.   Patient Reports nocturnal pain.  Difficulty dressing/grooming: Denies Difficulty climbing stairs: Denies Difficulty getting out of chair: Reports Difficulty using hands for taps, buttons, cutlery, and/or writing: Denies  Review of Systems  Constitutional: Negative for fatigue.  HENT: Negative for mouth sores, mouth dryness and nose dryness.   Eyes: Positive for itching and dryness. Negative for pain.  Respiratory: Negative for shortness of breath and  difficulty breathing.   Cardiovascular: Negative for chest pain and palpitations.  Gastrointestinal: Negative for blood in stool, constipation and diarrhea.  Endocrine: Negative for increased urination.  Genitourinary: Negative for difficulty urinating.  Musculoskeletal: Positive for arthralgias, joint pain, joint swelling, myalgias, morning stiffness, muscle tenderness and myalgias.  Skin: Negative for color change, rash and redness.  Allergic/Immunologic: Negative for susceptible to infections.  Neurological: Negative for dizziness, numbness, headaches, memory loss and weakness.  Hematological: Negative for bruising/bleeding tendency.  Psychiatric/Behavioral: Negative for confusion.    PMFS History:  Patient Active Problem List   Diagnosis Date Noted  . Psoriasis 01/11/2017  . High risk medication use 01/11/2017  . PSORIATIC ARTHRITIS 10/14/2009  . ALLERGIC RHINITIS 09/06/2007    Past Medical History:  Diagnosis Date  . ADHD   . Bile duct injury 2020   mild  . Cholecystitis   . Elevated LFTs   . MGUS (monoclonal gammopathy of unknown significance) 2021  . PONV (postoperative nausea and vomiting)   . Psoriatic arthritis ([place])   . Rib fracture     Family History  Problem Relation Age of Onset  . Hyperlipidemia Mother   . Ovarian cysts Mother        benign  . Endometriosis Mother   . Liver disease Mother        NASH  . Other Father        auto imune  . Coronary artery disease Father  cardio ablation  . COPD Maternal Grandmother   . Bone cancer Maternal Grandfather 24  . Arthritis/Rheumatoid Sister 76  . Endometriosis Sister   . Retinitis pigmentosa Sister 62  . Ulcerative colitis Sister 21       surgical repair, J pouch  . Ovarian cancer Sister 30       negative genetic testing  . Liver disease Maternal Aunt        NASH  . Colon cancer Neg Hx   . Esophageal cancer Neg Hx   . Pancreatic cancer Neg Hx   . Stomach cancer Neg Hx    Past Surgical History:   Procedure Laterality Date  . CHOLECYSTECTOMY N/A 08/13/2019   Procedure: LAPAROSCOPIC CHOLECYSTECTOMY;  Surgeon: Rolm Bookbinder, MD;  Location: [facility];  Service: General;  Laterality: N/A;  . INTRAUTERINE DEVICE INSERTION  05/30/15, 05/06/10   MIrena IUD  . TONSILLECTOMY AND ADENOIDECTOMY  1990  . WISDOM TOOTH EXTRACTION Bilateral 1991   Social History   Social History Narrative  . Not on file   Immunization History  Administered Date(s) Administered  . Influenza Whole 08/30/2003  . Influenza,inj,Quad PF,6+ Mos 09/01/2015, 09/14/2019, 11/14/2020  . PFIZER(Purple Top)SARS-COV-2 Vaccination 02/09/2020, 03/03/2020, 09/02/2020  . Tdap 02/25/2011, 02/17/2021     Objective: Vital Signs: BP 107/63 (BP Location: Left Arm, Patient Position: Sitting, Cuff Size: Normal)   Pulse 68   Resp 14   Ht 5' 7.25" (1.708 m)   Wt 153 lb (69.4 kg)   BMI 23.79 kg/m    Physical Exam Vitals and nursing note reviewed.  Constitutional:      Appearance: She is well-developed.  HENT:     Head: Normocephalic and atraumatic.  Eyes:     Conjunctiva/sclera: Conjunctivae normal.  Pulmonary:     Effort: Pulmonary effort is normal.  Abdominal:     Palpations: Abdomen is soft.  Musculoskeletal:     Cervical back: Normal range of motion.  Skin:    General: Skin is warm and dry.     Capillary Refill: Capillary refill takes less than 2 seconds.  Neurological:     Mental Status: She is alert and oriented to person, place, and time.  Psychiatric:        Behavior: Behavior normal.      Musculoskeletal Exam: C-spine limited ROM with lateral rotation.  Trapezius muscle tension and tenderness bilaterally.  Thoracic spine and lumbar spine good ROM. No midline spinal tenderness. No SI joint tenderness. Shoulder joints, elbow joints, wrist joints, MCPs, PIPs, and DIPs good ROM with no synovitis.  Complete fist formation bilaterally.  Hip joints, knee joints, and ankle joints good ROM with no discomfort.  No  tenderness to palpation over trochanteric bursa.  No warmth or effusion of knee joints.  No tenderness or swelling of ankle joints.    CDAI Exam: CDAI Score: 0.8  Patient Global: 4 mm; Provider Global: 4 mm Swollen: 0 ; Tender: 0  Joint Exam 04/01/2021   No joint exam has been documented for this visit   There is currently no information documented on the homunculus. Go to the Rheumatology activity and complete the homunculus joint exam.  Investigation: No additional findings.  Imaging: No results found.  Recent Labs: Lab Results  Component Value Date   WBC 6.0 03/16/2021   HGB 13.5 03/16/2021   PLT 202 03/16/2021   NA 139 03/16/2021   K 4.2 03/16/2021   CL 103 03/16/2021   CO2 28 03/16/2021   GLUCOSE 97 03/16/2021  BUN 16 03/16/2021   CREATININE 0.76 03/16/2021   BILITOT 0.7 03/17/2021   ALKPHOS 41 03/17/2021   AST 27 03/17/2021   ALT 16 03/17/2021   PROT 6.6 03/17/2021   ALBUMIN 4.2 03/17/2021   CALCIUM 9.1 03/16/2021   GFRAA 107 03/16/2021   QFTBGOLD NEGATIVE 11/04/2017   QFTBGOLDPLUS NEGATIVE 03/16/2021    Speciality Comments: No specialty comments available.  Procedures:  No procedures performed Allergies: Tamiflu [oseltamivir phosphate]   Assessment / Plan:     Visit Diagnoses: Psoriatic arthritis ([place]) -She has no synovitis or dactylitis on exam.  She has been experiencing increased arthralgias and joint stiffness over the past several months.  She was started on Simponi 50 mg subcutaneous injections every 28 days on 08/14/2020.  She initially noticed a significant improvement in her symptoms but over the past several months has been having breakthrough pain 2 weeks leading up to her next injection.  She has noticed an increase in scalp psoriasis, nail changes, increased discomfort due to plantar fasciitis in both feet, neck pain, and hip discomfort bilaterally.  It is unclear if the toe nail changes are due to underlying psoriasis or onychomycosis.  She was  advised to schedule up with her dermatologist for further evaluation.  According to the patient most of her discomfort seems soft tissue in origin and she has not noticed any joint swelling.  She remains active exercising on a daily basis without difficulty.  She had no inflammation on examination today.  She had no SI joint tenderness.  No evidence of Achilles tendinitis was noted.  Different treatment options were discussed today in detail.  She has not a good candidate for IL-17 inhibitors due to family history of IBD.  She is not a good candidate for combination therapy with methotrexate or Arava due to history of elevated LFTs although her hepatic function panel was within normal limits on 03/17/2021.  She has tried to take diclofenac tablets very sparingly for pain relief.  She may be a good candidate for adding on Otezla as combination therapy in the future.  We will check a sed rate today.  If her sed rate is elevated and she continues to have an increase in symptoms we will have to discuss other treatment options.  She does not want to make any medication changes at this time.  A prescription for clobetasol 0.05% shampoo was sent to the pharmacy which she can apply topically as needed for scalp psoriasis.  She was also given a prescription for methocarbamol 500 mg 1 tablet daily as needed for muscle spasms to try to alleviate the neck pain she is experiencing.  She was advised to notify us if her discomfort persists or worsens.  She will follow-up in the office in 3 months to reassess.  Plan: Sedimentation rate  Psoriasis: She has scattered patches of psoriasis on her scalp.  She has noticed increased scalp psoriasis since switching from Humira to Simponi.  A prescription for clobetasol shampoo 0.05% was sent to the pharmacy to apply topically as needed.  She was advised to notify us if this does not improve her scalp psoriasis.  Nail brittleness: She has noticed several nail changes in her fingernails  and toenails over the past several months.  She was previously getting any care and pedicures on a regular basis but has stopped due to increased nail brittleness.  No fingernail pitting or dystrophy was noted.  Longitudinal ridging in her fingernails were noted.  Some discoloration in her toenails  concerning for possible onychomycosis was noted.  It is unclear if the nail changes in her toenails is consistent with psoriasis or onychomycosis or an overlap.  She was advised to schedule an appointment with her dermatologist for further evaluation.  High risk medication use - Simponi 50 mg sq injections once every 28 days-started on 08/14/20. Inadequate response to Enbrel and Humira.CBC and CMP WNL on 03/16/21.  Hepatic function panel WNL on 03/17/21.  Her next lab work will be due in July and every 3 months to monitor for drug toxicity.  Standing orders for CBC and CMP are in place.  TB gold negative on 03/16/21 and will continue to monitor yearly.  Advised to hold Simponi if she develops signs or symptoms of an infection and to resume once the infection has completely cleared.  An exemption letter to postpone receiving the MMR vaccination was provided today.  Discussed that she would have to hold Simponi at least 1 month prior to and 1 month after receiving the vaccination since it is a live vaccine. The administration of live vaccines  Live attenuated vaccines should generally be avoided in immunosuppressed patients.   Chronic SI joint pain: She has no SI joint tenderness to palpation on examination today.  Trochanteric bursitis of both hips: She experiences intermittent discomfort due to trochanteric bursitis of both hips and stiffness in hip flexors.   Neck pain: She has been experiencing increased neck pain and stiffness especially with lateral rotation over the past several weeks. According to the patient she has been experiencing increased neck pain since receiving her Tdap vaccine recently. She was  previously followed by Dr. Marlou Sa for neck pain and right-sided radiculopathy.  She had an MRI of the C-spine performed in 2016 which revealed moderate sized right foraminal disc osteophyte complex at C5-C6.  She also had mild disc bulging at C6-C7 without cord deformity or significant foraminal compromise.   She declined updated x-rays today.  She was strongly encouraged to try to avoid Tylenol and NSAIDs due to history of elevated LFTs.  She was given a prescription for methocarbamol 500 mg 1 tablet by mouth daily as needed for muscle spasms.  She was advised to notify us if her discomfort persists or worsens.  Plantar fasciitis: She has been experiencing symptoms of plantar fasciitis in both feet.  She continues to remain active and exercises on a daily basis.  We discussed the importance of wearing proper fitting shoes.  She was advised to notify us if her symptoms persist or worsen.  Need for MMR vaccine: Current to the patient she was notified that she will require an updated MMR vaccine prior to restarting school in August 2022.  We discussed that the MMR vaccine is a live vaccine thus she would have to hold simponi for at least 1 month prior to and at least 1 month after receiving the vaccine.  Since she has been having signs and symptoms of active disease it is not a good time to hold her medication.  She was provided an exemption letter to postpone the vaccine at this time.  We will rediscuss scheduling this vaccine at her future follow-up visit.  Elevated LFTs: Hepatic function panel was within normal limits on 03/17/2021.  Orders: Orders Placed This Encounter  Procedures  . Sedimentation rate   Meds ordered this encounter  Medications  . methocarbamol (ROBAXIN) 500 MG tablet    Sig: Take 1 tablet (500 mg total) by mouth daily as needed for muscle spasms.  Dispense:  14 tablet    Refill:  0  . Clobetasol Propionate 0.05 % shampoo    Sig: Apply topically to the scalp once daily as  needed.    Dispense:  118 mL    Refill:  0     Follow-Up Instructions: Return in about 5 months (around 09/01/2021) for Psoriatic arthritis.   Ofilia Neas, PA-C  Note - This record has been created using Dragon software.  Chart creation errors have been sought, but may not always  have been located. Such creation errors do not reflect on  the standard of medical care.

## 2021-04-02 NOTE — Progress Notes (Signed)
ESR is WNL.  Continue on Simponi as prescribed.

## 2021-04-03 ENCOUNTER — Telehealth: Payer: Self-pay

## 2021-04-03 NOTE — Telephone Encounter (Signed)
Simponi PA APPROVAL already on file. Closing this encounter.

## 2021-04-03 NOTE — Telephone Encounter (Deleted)
Submitted a Prior Authorization request to [facility] for [facility] via CoverMyMeds. Will update once we receive a response.   Key: [id number]

## 2021-04-21 ENCOUNTER — Other Ambulatory Visit (HOSPITAL_COMMUNITY): Payer: Self-pay

## 2021-04-28 ENCOUNTER — Other Ambulatory Visit (HOSPITAL_COMMUNITY): Payer: Self-pay

## 2021-05-22 ENCOUNTER — Other Ambulatory Visit (HOSPITAL_COMMUNITY): Payer: Self-pay

## 2021-05-24 ENCOUNTER — Other Ambulatory Visit: Payer: Self-pay | Admitting: Hematology and Oncology

## 2021-05-24 DIAGNOSIS — D472 Monoclonal gammopathy: Secondary | ICD-10-CM

## 2021-05-25 ENCOUNTER — Inpatient Hospital Stay: Payer: BC Managed Care – PPO | Admitting: Hematology and Oncology

## 2021-05-25 ENCOUNTER — Inpatient Hospital Stay: Payer: BC Managed Care – PPO | Attending: Internal Medicine

## 2021-06-02 DIAGNOSIS — Z23 Encounter for immunization: Secondary | ICD-10-CM | POA: Diagnosis not present

## 2021-06-04 ENCOUNTER — Other Ambulatory Visit (HOSPITAL_COMMUNITY): Payer: Self-pay

## 2021-06-26 ENCOUNTER — Other Ambulatory Visit (HOSPITAL_COMMUNITY): Payer: Self-pay

## 2021-06-30 ENCOUNTER — Other Ambulatory Visit (HOSPITAL_COMMUNITY): Payer: Self-pay

## 2021-06-30 ENCOUNTER — Other Ambulatory Visit: Payer: Self-pay | Admitting: Physician Assistant

## 2021-06-30 DIAGNOSIS — L4050 Arthropathic psoriasis, unspecified: Secondary | ICD-10-CM

## 2021-06-30 MED ORDER — SIMPONI 50 MG/0.5ML ~~LOC~~ SOAJ
SUBCUTANEOUS | 2 refills | Status: DC
  Filled 2021-06-30: qty 0.5, 28d supply, fill #0
  Filled 2021-07-23: qty 0.5, 28d supply, fill #1
  Filled 2021-09-30 – 2021-10-01 (×2): qty 0.5, 28d supply, fill #2

## 2021-06-30 NOTE — Telephone Encounter (Signed)
Next Visit: Message sent to front desk to schedule f/u appt, Return in about 5 months (around 09/01/2021) for Psoriatic arthritis  Last Visit: 04/01/2021,   Last Fill: 03/23/2021  [id number] arthritis  Current Dose per office note 04/01/2021: Simponi 50 mg sq injections once every 28 days  Labs: 03/16/2021, normal  TB Gold: 03/16/2021, negative  Okay to refill Simponi?

## 2021-06-30 NOTE — Telephone Encounter (Signed)
Please call patient to schedule f/u appt. Thank you.  Return in about 5 months (around 09/01/2021) for Psoriatic arthritis

## 2021-07-01 ENCOUNTER — Other Ambulatory Visit (HOSPITAL_COMMUNITY): Payer: Self-pay

## 2021-07-23 ENCOUNTER — Other Ambulatory Visit (HOSPITAL_COMMUNITY): Payer: Self-pay

## 2021-08-10 ENCOUNTER — Telehealth: Payer: Self-pay

## 2021-08-10 NOTE — Telephone Encounter (Signed)
Patient left a voicemail stating she was in last week with immunization forms to be filled out for [facility].  I left them at the front desk.  Patient states she received a call from [facility] on Friday afternoon late saying they had not been received.  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  Please call back at [phone]

## 2021-08-10 NOTE — Telephone Encounter (Signed)
Attempted to contact patient and left message on machine to advise patient that form was faxed today and we have confirmation that fax went through. Advised patient she can pick up a copy of that confirmation if needed.

## 2021-08-24 ENCOUNTER — Other Ambulatory Visit (HOSPITAL_COMMUNITY): Payer: Self-pay

## 2021-09-30 ENCOUNTER — Other Ambulatory Visit (HOSPITAL_COMMUNITY): Payer: Self-pay

## 2021-09-30 ENCOUNTER — Telehealth: Payer: Self-pay

## 2021-09-30 NOTE — Progress Notes (Signed)
Office Visit Note  Patient: Samantha Graves             Date of Birth: [birth year]           MRN: [id number]             PCP: Fay Records, MD Referring: Fay Records, MD Visit Date: 10/01/2021 Occupation: @GUAROCC @  Subjective:  Pain and stiffness in multiple joints.   History of Present Illness: Samantha Graves is a 50 y.o. female with a history of psoriatic arthritis and psoriasis.  She states that Simponi subcu injections are not as effective.  She has been having breakthrough pain and discomfort.  She states that the relief lasted only for 2 weeks and then the symptoms come back.  She complains of discomfort in her right shoulder, left SI joint and left trochanteric area.  She complains of left trochanteric bursa pain at night when she sleeping on her left side.  There is no history of joint swelling.  She ran out of Simponi about a month ago and could not get the refill as she has not had labs since April 2022.  She is having psoriasis rash in her scalp.    Activities of Daily Living:  Patient reports morning stiffness for several  hours.   Patient Reports nocturnal pain.  Difficulty dressing/grooming: Denies Difficulty climbing stairs: Denies Difficulty getting out of chair: Denies Difficulty using hands for taps, buttons, cutlery, and/or writing: Denies  Review of Systems  Constitutional:  Negative for fatigue.  HENT:  Negative for mouth sores, mouth dryness and nose dryness.   Eyes:  Positive for itching and dryness. Negative for pain.  Respiratory:  Negative for shortness of breath and difficulty breathing.   Cardiovascular:  Negative for chest pain and palpitations.  Gastrointestinal:  Negative for blood in stool, constipation and diarrhea.  Endocrine: Negative for increased urination.  Genitourinary:  Negative for difficulty urinating.  Musculoskeletal:  Positive for joint pain, joint pain, myalgias, morning stiffness, muscle tenderness and myalgias. Negative  for joint swelling.  Skin:  Positive for rash. Negative for color change, redness and sensitivity to sunlight.  Allergic/Immunologic: Negative for susceptible to infections.  Neurological:  Positive for headaches. Negative for dizziness, numbness, memory loss and weakness.  Hematological:  Negative for bruising/bleeding tendency.  Psychiatric/Behavioral:  Negative for depressed mood, confusion and sleep disturbance. The patient is nervous/anxious.    PMFS History:  Patient Active Problem List   Diagnosis Date Noted   Psoriasis 01/11/2017   High risk medication use 01/11/2017   PSORIATIC ARTHRITIS 10/14/2009   ALLERGIC RHINITIS 09/06/2007    Past Medical History:  Diagnosis Date   ADHD    Bile duct injury 2020   mild   Cholecystitis    Elevated LFTs    MGUS (monoclonal gammopathy of unknown significance) 2021   PONV (postoperative nausea and vomiting)    Psoriatic arthritis (HCC)    Rib fracture     Family History  Problem Relation Age of Onset   Hyperlipidemia Mother    Ovarian cysts Mother        benign   Endometriosis Mother    Liver disease Mother        NASH   Other Father        auto imune   Coronary artery disease Father        cardio ablation   COPD Maternal Grandmother    Bone cancer Maternal Grandfather 99   Arthritis/Rheumatoid Sister 24  Endometriosis Sister    Retinitis pigmentosa Sister 15   Ulcerative colitis Sister 62       surgical repair, J pouch   Ovarian cancer Sister 55       negative genetic testing   Liver disease Maternal Aunt        NASH   Colon cancer Neg Hx    Esophageal cancer Neg Hx    Pancreatic cancer Neg Hx    Stomach cancer Neg Hx    Past Surgical History:  Procedure Laterality Date   CHOLECYSTECTOMY N/A 08/13/2019   Procedure: LAPAROSCOPIC CHOLECYSTECTOMY;  Surgeon: Rolm Bookbinder, MD;  Location: [facility];  Service: General;  Laterality: N/A;   INTRAUTERINE DEVICE INSERTION  05/30/15, 05/06/10   MIrena IUD   TONSILLECTOMY AND  [place] EXTRACTION Bilateral 1991   Social History   Social History Narrative   Not on file   Immunization History  Administered Date(s) Administered   Influenza Whole 08/30/2003   Influenza,inj,Quad PF,6+ Mos 09/01/2015, 09/14/2019, 11/14/2020   PFIZER(Purple Top)SARS-COV-2 Vaccination 02/09/2020, 03/03/2020, 09/02/2020   Tdap 02/25/2011, 02/17/2021     Objective: Vital Signs: BP 119/82 (BP Location: Left Arm, Patient Position: Sitting, Cuff Size: Normal)   Pulse 73   Ht 5\' 7"  (1.702 m)   Wt 152 lb (68.9 kg)   BMI 23.81 kg/m    Physical Exam Vitals and nursing note reviewed.  Constitutional:      Appearance: She is well-developed.  HENT:     Head: Normocephalic and atraumatic.  Eyes:     Conjunctiva/sclera: Conjunctivae normal.  Cardiovascular:     Rate and Rhythm: Normal rate and regular rhythm.     Heart sounds: Normal heart sounds.  Pulmonary:     Effort: Pulmonary effort is normal.     Breath sounds: Normal breath sounds.  Abdominal:     General: Bowel sounds are normal.     Palpations: Abdomen is soft.  Musculoskeletal:     Cervical back: Normal range of motion.  Lymphadenopathy:     Cervical: No cervical adenopathy.  Skin:    General: Skin is warm and dry.     Capillary Refill: Capillary refill takes less than 2 seconds.  Neurological:     Mental Status: She is alert and oriented to person, place, and time.  Psychiatric:        Behavior: Behavior normal.     Musculoskeletal Exam: C-spine thoracic and lumbar spine were in good range of motion.  She had no SI joint tenderness.  Shoulder joints, elbow joints, wrist joints, MCPs PIPs and DIPs with good range of motion with no synovitis.  Hip joints, knee joints, ankles, MTPs and PIPs with good range of motion with no synovitis.  She had tenderness on palpation over left trochanteric bursa consistent with left trochanteric bursitis.  CDAI Exam: CDAI Score: 1.4  Patient Global: 2  mm; Provider Global: 2 mm Swollen: 0 ; Tender: 2  Joint Exam 10/01/2021      Right  Left  Glenohumeral   Tender     Sacroiliac      Tender     Investigation: No additional findings.  Imaging: No results found.  Recent Labs: Lab Results  Component Value Date   WBC 6.0 03/16/2021   HGB 13.5 03/16/2021   PLT 202 03/16/2021   NA 139 03/16/2021   K 4.2 03/16/2021   CL 103 03/16/2021   CO2 28 03/16/2021   GLUCOSE 97 03/16/2021   BUN  16 03/16/2021   CREATININE 0.76 03/16/2021   BILITOT 0.7 03/17/2021   ALKPHOS 41 03/17/2021   AST 27 03/17/2021   ALT 16 03/17/2021   PROT 6.6 03/17/2021   ALBUMIN 4.2 03/17/2021   CALCIUM 9.1 03/16/2021   GFRAA 107 03/16/2021   QFTBGOLD NEGATIVE 11/04/2017   QFTBGOLDPLUS NEGATIVE 03/16/2021    Speciality Comments: Enbrel, Humira-inadequate response Simponi-04/22  Procedures:  Large Joint Inj: L greater trochanter on 10/01/2021 3:57 PM Indications: pain Details: 27 G 1.5 in needle, lateral approach  Arthrogram: No  Medications: 40 mg triamcinolone acetonide 40 MG/ML; 1.5 mL lidocaine 1 % Aspirate: 0 mL Outcome: tolerated well, no immediate complications Procedure, treatment alternatives, risks and benefits explained, specific risks discussed. Consent was given by the patient. Immediately prior to procedure a time out was called to verify the correct patient, procedure, equipment, support staff and site/side marked as required. Patient was prepped and draped in the usual sterile fashion.    Allergies: Tamiflu [oseltamivir phosphate]   Assessment / Plan:     Visit Diagnoses: Psoriatic arthritis (HCC)-history of dactylitis, plantar fasciitis, sacroiliitis in the past.  She has done better on Simponi and subcutaneous injections but she states that the effect of Simponi lasted only 2 weeks and then she has no recurrence of symptoms.  She has been out of Simponi for almost a month as she could not get refills due to noncompliance with the  labs.  I detailed discussion with the patient today regarding adding leflunomide.  Indications side effects contraindications were discussed at length.  She was in agreement and wants to proceed with leflunomide.  Handout was given and consent was taken.  We will obtain labs today.  If her labs are stable we will call in a prescription for leflunomide 10 mg p.o. daily.  Will obtain labs in 2 weeks if labs are normal we will increase the dose of clindamycin to 20 mg p.o. daily.  She will get labs again in 2 weeks and then every 3 months to monitor for drug toxicity.  She will stay on combination therapy of leflunomide and Simponi subcutaneous.  Medication counseling:   Baseline Immunosuppressant Therapy Labs  Quantiferon TB Gold Latest Ref Rng & Units 03/16/2021  Quantiferon TB Gold Plus NEGATIVE NEGATIVE    Hepatitis Latest Ref Rng & Units 06/29/2019  Hep B Surface Ag NON-REACTI NON-REACTIVE  Hep C Ab NON-REACTI NON-REACTIVE  Hep C Ab NON-REACTI NON-REACTIVE    Lab Results  Component Value Date   HIV NON-REACTIVE 04/15/2020    Immunoglobulin Electrophoresis Latest Ref Rng & Units 11/14/2020  IgA  47 - 310 mg/dL -  IgG 586 - 1,602 mg/dL 814  IgM 26 - 217 mg/dL 190    Serum Protein Electrophoresis Latest Ref Rng & Units 03/17/2021  Total Protein 6.0 - 8.3 g/dL 6.6  Albumin 3.8 - 4.8 g/dL -  Alpha-1 0.2 - 0.3 g/dL -  Alpha-2 0.5 - 0.9 g/dL -  Beta Globulin 0.4 - 0.6 g/dL -  Beta 2 0.2 - 0.5 g/dL -  Gamma Globulin 0.8 - 1.7 g/dL -     Patient was counseled on the purpose, proper use, and adverse effects of leflunomide including risk of infection, nausea/diarrhea/weight loss, increase in blood pressure, rash, hair loss, tingling in the hands and feet, and signs and symptoms of interstitial lung disease.   Also counseled on Black Box warning of liver injury and importance of avoiding alcohol while on therapy. Discussed that there is the possibility of  an increased risk of malignancy but  it is not well understood if this increased risk is due to the medication or the disease state.  Counseled patient to avoid live vaccines. Recommend annual influenza, Pneumovax 23, Prevnar 13, and Shingrix as indicated.   Discussed the importance of frequent monitoring of liver function and blood count.  Standing orders placed.  Discussed importance of birth control while on leflunomide due to risk of congenital abnormalities, and patient confirms use of Mirena IUD.  Provided patient with educational materials on leflunomide and answered all questions.  Patient consented to [place] use, and consent will be uploaded into the media tab.   Patient dose will be leflunomide 10 mg p.o. daily for 2 weeks and if labs are normal we will increase the dose to leflunomide 20 mg p.o. daily.  Prescription pending lab results and/or insurance approval.   Psoriasis-she had psoriasis patches in her scalp.  High risk medication use - Simponi 50 mg sq injections once every 28 days-started on 08/14/20. 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  She had no tenderness on palpation today.  Trochanteric bursitis of both hips -she complains of trochanteric bursitis in the bilateral hips but more prominent on left side.  She states the pain is severe enough that she is waking up in the middle of the night.  Different treatment options were discussed.  I given stretches were discussed.  She requested left trochanteric bursa injection.  After informed consent was obtained left trochanteric bursa was injected with lidocaine and cortisone as described above.  She tolerated the procedure well.   Neck pain-she complains of neck stiffness off and on.  Plantar fasciitis-she denies any recurrence of plantar fasciitis.  Elevated  LFTs-she had elevated LFTs in  2020 which she relates to have been cholecystitis.  She underwent cholecystectomy.  Her LFTs have been normal since then.  Monoclonal gammopathy - followed by dr. Lorenso Courier.  On chart review her last visit with oncology was on May 02, 2020.  She did not go for follow-up visit.  I advised her to schedule an appointment with Dr. Lorenso Courier.  Orders: Orders Placed This Encounter  Procedures   Large Joint Inj   CBC with Differential/Platelet   COMPLETE METABOLIC PANEL WITH GFR    No orders of the defined types were placed in this encounter.    Follow-Up Instructions: Return in about 6 weeks (around 11/12/2021) for Psoriatic arthritis.   Bo Merino, MD  Note - This record has been created using Editor, commissioning.  Chart creation errors have been sought, but may not always  have been located. Such creation errors do not reflect on  the standard of medical care.

## 2021-09-30 NOTE — Telephone Encounter (Signed)
Patient scheduled for 10/01/2021.

## 2021-09-30 NOTE — Telephone Encounter (Signed)
Patient called stating she is past due to take her Simponi injection due to the pharmacy waiting on a PA.  Patient is requesting to stop by the office tomorrow, 10/01/21 to pick up a sample.

## 2021-10-01 ENCOUNTER — Other Ambulatory Visit: Payer: Self-pay

## 2021-10-01 ENCOUNTER — Ambulatory Visit (INDEPENDENT_AMBULATORY_CARE_PROVIDER_SITE_OTHER): Payer: BC Managed Care – PPO | Admitting: Rheumatology

## 2021-10-01 ENCOUNTER — Telehealth: Payer: Self-pay

## 2021-10-01 ENCOUNTER — Encounter: Payer: Self-pay | Admitting: Rheumatology

## 2021-10-01 ENCOUNTER — Other Ambulatory Visit (HOSPITAL_COMMUNITY): Payer: Self-pay

## 2021-10-01 VITALS — BP 119/82 | HR 73 | Ht 67.0 in | Wt 152.0 lb

## 2021-10-01 DIAGNOSIS — L4050 Arthropathic psoriasis, unspecified: Secondary | ICD-10-CM | POA: Diagnosis not present

## 2021-10-01 DIAGNOSIS — Z79899 Other long term (current) drug therapy: Secondary | ICD-10-CM | POA: Diagnosis not present

## 2021-10-01 DIAGNOSIS — M7061 Trochanteric bursitis, right hip: Secondary | ICD-10-CM

## 2021-10-01 DIAGNOSIS — M7062 Trochanteric bursitis, left hip: Secondary | ICD-10-CM | POA: Diagnosis not present

## 2021-10-01 DIAGNOSIS — R7989 Other specified abnormal findings of blood chemistry: Secondary | ICD-10-CM

## 2021-10-01 DIAGNOSIS — L603 Nail dystrophy: Secondary | ICD-10-CM

## 2021-10-01 DIAGNOSIS — L409 Psoriasis, unspecified: Secondary | ICD-10-CM

## 2021-10-01 DIAGNOSIS — M533 Sacrococcygeal disorders, not elsewhere classified: Secondary | ICD-10-CM

## 2021-10-01 DIAGNOSIS — D472 Monoclonal gammopathy: Secondary | ICD-10-CM

## 2021-10-01 DIAGNOSIS — M542 Cervicalgia: Secondary | ICD-10-CM

## 2021-10-01 DIAGNOSIS — M722 Plantar fascial fibromatosis: Secondary | ICD-10-CM

## 2021-10-01 DIAGNOSIS — G8929 Other chronic pain: Secondary | ICD-10-CM

## 2021-10-01 MED ORDER — LIDOCAINE HCL 1 % IJ SOLN
1.5000 mL | INTRAMUSCULAR | Status: AC | PRN
  Administered 2021-10-01: 1.5 mL

## 2021-10-01 MED ORDER — TRIAMCINOLONE ACETONIDE 40 MG/ML IJ SUSP
40.0000 mg | INTRAMUSCULAR | Status: AC | PRN
  Administered 2021-10-01: 40 mg via INTRA_ARTICULAR

## 2021-10-01 NOTE — Patient Instructions (Addendum)
Leflunomide tablets What is this medication? LEFLUNOMIDE (le FLOO na mide) is for rheumatoid arthritis. This medicine may be used for other purposes; ask your health care provider or pharmacist if you have questions. COMMON BRAND NAME(S): Arava What should I tell my care team before I take this medication? They need to know if you have any of these conditions: diabetes have a fever or infection high blood pressure immune system problems kidney disease liver disease low blood cell counts, like low white cell, platelet, or red cell counts lung or breathing disease, like asthma recently received or scheduled to receive a vaccine receiving treatment for cancer skin conditions or sensitivity tingling of the fingers or toes, or other nerve disorder tuberculosis an unusual or allergic reaction to leflunomide, teriflunomide, other medicines, food, dyes, or preservatives pregnant or trying to get pregnant breast-feeding How should I use this medication? Take this medicine by mouth with a full glass of water. Follow the directions on the prescription label. Take your medicine at regular intervals. Do not take your medicine more often than directed. Do not stop taking except on your doctor's advice. Talk to your pediatrician regarding the use of this medicine in children. Special care may be needed. Overdosage: If you think you have taken too much of this medicine contact a poison control center or emergency room at once. NOTE: This medicine is only for you. Do not share this medicine with others. What if I miss a dose? If you miss a dose, take it as soon as you can. If it is almost time for your next dose, take only that dose. Do not take double or extra doses. What may interact with this medication? Do not take this medicine with any of the following medications: teriflunomide This medicine may also interact with the following medications: alosetron birth control  pills caffeine cefaclor certain medicines for diabetes like nateglinide, repaglinide, rosiglitazone, pioglitazone certain medicines for high cholesterol like atorvastatin, pravastatin, rosuvastatin, simvastatin charcoal cholestyramine ciprofloxacin duloxetine furosemide ketoprofen live virus vaccines medicines that increase your risk for infection methotrexate mitoxantrone paclitaxel penicillin theophylline tizanidine warfarin This list may not describe all possible interactions. Give your health care provider a list of all the medicines, herbs, non-prescription drugs, or dietary supplements you use. Also tell them if you smoke, drink alcohol, or use illegal drugs. Some items may interact with your medicine. What should I watch for while using this medication? Visit your health care provider for regular checks on your progress. Tell your doctor or health care provider if your symptoms do not start to get better or if they get worse. You may need blood work done while you are taking this medicine. This medicine may cause serious skin reactions. They can happen weeks to months after starting the medicine. Contact your health care provider right away if you notice fevers or flu-like symptoms with a rash. The rash may be red or purple and then turn into blisters or peeling of the skin. Or, you might notice a red rash with swelling of the face, lips or lymph nodes in your neck or under your arms. This medicine may stay in your body for up to 2 years after your last dose. Tell your doctor about any unusual side effects or symptoms. A medicine can be given to help lower your blood levels of this medicine more quickly. Women must use effective birth control with this medicine. There is a potential for serious side effects to an unborn child. Do not become pregnant while  taking this medicine. Inform your doctor if you wish to become pregnant. This medicine remains in your blood after you stop taking  it. You must continue using effective birth control until the blood levels have been checked and they are low enough. A medicine can be given to help lower your blood levels of this medicine more quickly. Immediately talk to your doctor if you think you may be pregnant. You may need a pregnancy test. Talk to your health care provider or pharmacist for more information. You should not receive certain vaccines during your treatment and for a certain time after your treatment with this medication ends. Talk to your health care provider for more information. What side effects may I notice from receiving this medication? Side effects that you should report to your doctor or health care professional as soon as possible: allergic reactions like skin rash, itching or hives, swelling of the face, lips, or tongue breathing problems cough increased blood pressure low blood counts - this medicine may decrease the number of white blood cells and platelets. You may be at increased risk for infections and bleeding. pain, tingling, numbness in the hands or feet rash, fever, and swollen lymph nodes redness, blistering, peeing or loosening of the skin, including inside the mouth signs of decreased platelets or bleeding - bruising, pinpoint red spots on the skin, black, tarry stools, blood in urine signs of infection - fever or chills, cough, sore throat, pain or trouble passing urine signs and symptoms of liver injury like dark yellow or brown urine; general ill feeling or flu-like symptoms; light-colored stools; loss of appetite; nausea; right upper belly pain; unusually weak or tired; yellowing of the eyes or skin trouble passing urine or change in the amount of urine vomiting Side effects that usually do not require medical attention (report to your doctor or health care professional if they continue or are bothersome): diarrhea hair thinning or loss headache nausea tiredness This list may not describe all  possible side effects. Call your doctor for medical advice about side effects. You may report side effects to FDA at 1-800-FDA-1088. Where should I keep my medication? Keep out of the reach of children. Store at room temperature between 15 and 30 degrees C (59 and 86 degrees F). Protect from moisture and light. Throw away any unused medicine after the expiration date. NOTE: This sheet is a summary. It may not cover all possible information. If you have questions about this medicine, talk to your doctor, pharmacist, or health care provider.  2022 Elsevier/Gold Standard (2019-02-16 15:06:48)  Standing Labs We placed an order today for your standing lab work.   Please have your standing labs drawn in 2 weeks x 2 and then every 99-month  If possible, please have your labs drawn 2 weeks prior to your appointment so that the provider can discuss your results at your appointment.  Please note that you may see your imaging and lab results in [place] before we have reviewed them. We may be awaiting multiple results to interpret others before contacting you. Please allow our office up to 72 hours to thoroughly review all of the results before contacting the office for clarification of your results.  We have open lab daily: Monday through Thursday from 1:30-4:30 PM and Friday from 1:30-4:00 PM at the office of Dr. Bo Merino, [facility].   Please be advised, all patients with office appointments requiring lab work will take precedent over walk-in lab work.  If possible, please come  for your lab work on Monday and Friday afternoons, as you may experience shorter wait times. The office is located at [address], [place], [place], [place] [address] No appointment is necessary.   Labs are drawn by [facility]. Please bring your co-pay at the time of your lab draw.  You may receive a bill from [facility] for your lab work.  If you wish to have your labs drawn at another location, please call  the office 24 hours in advance to send orders.  If you have any questions regarding directions or hours of operation,  please call [phone].   As a reminder, please drink plenty of water prior to coming for your lab work. Thanks!   Vaccines You are taking a medication(s) that can suppress your immune system.  The following immunizations are recommended: Flu annually Covid-19  Td/Tdap (tetanus, diphtheria, pertussis) every 10 years Pneumonia (Prevnar 15 then Pneumovax 23 at least 1 year apart.  Alternatively, can take Prevnar 20 without needing additional dose) Shingrix: 2 doses from 4 weeks to 6 months apart  Please check with your PCP to make sure you are up to date.  If you have signs or symptoms of an infection or start antibiotics: First, call your PCP for workup of your infection. Hold your medication through the infection, until you complete your antibiotics, and until symptoms resolve if you take the following: Injectable medication (Actemra, Benlysta, Cimzia, Cosentyx, Enbrel, Humira, Kevzara, Orencia, Remicade, Simponi, Stelara, Taltz, Tremfya) Methotrexate Leflunomide (Arava) Mycophenolate (Cellcept) Roma Kayser, or Rinvoq   The recommendations are to get annual skin examination to rule out for skin cancer while you are taking Simponi.   Iliotibial Band Syndrome Rehab Ask your health care provider which exercises are safe for you. Do exercises exactly as told by your health care provider and adjust them as directed. It is normal to feel mild stretching, pulling, tightness, or discomfort as you do these exercises. Stop right away if you feel sudden pain or your pain gets significantly worse. Do not begin these exercises until told by your health care provider. Stretching and range-of-motion exercises These exercises warm up your muscles and joints and improve the movement and flexibility of your hip and pelvis. Quadriceps stretch, prone  Lie on your abdomen (prone  position) on a firm surface, such as a bed or padded floor. Bend your left / right knee and reach back to hold your ankle or pant leg. If you cannot reach your ankle or pant leg, loop a belt around your foot and grab the belt instead. Gently pull your heel toward your buttocks. Your knee should not slide out to the side. You should feel a stretch in the front of your thigh and knee (quadriceps). Hold this position for __________ seconds. Repeat __________ times. Complete this exercise __________ times a day. Iliotibial band stretch An iliotibial band is a strong band of muscle tissue that runs from the outer side of your hip to the outer side of your thigh and knee. Lie on your side with your left / right leg in the top position. Bend both of your knees and grab your left / right ankle. Stretch out your bottom arm to help you balance. Slowly bring your top knee back so your thigh goes behind your trunk. Slowly lower your top leg toward the floor until you feel a gentle stretch on the outside of your left / right hip and thigh. If you do not feel a stretch and your knee will  not fall farther, place the heel of your other foot on top of your knee and pull your knee down toward the floor with your foot. Hold this position for __________ seconds. Repeat __________ times. Complete this exercise __________ times a day. Strengthening exercises These exercises build strength and endurance in your hip and pelvis. Endurance is the ability to use your muscles for a long time, even after they get tired. Straight leg raises, side-lying This exercise strengthens the muscles that rotate the leg at the hip and move it away from your body (hip abductors). Lie on your side with your left / right leg in the top position. Lie so your head, shoulder, hip, and knee line up. You may bend your bottom knee to help you balance. Roll your hips slightly forward so your hips are stacked directly over each other and your left /  right knee is facing forward. Tense the muscles in your outer thigh and lift your top leg 4-6 inches (10-15 cm). Hold this position for __________ seconds. Slowly lower your leg to return to the starting position. Let your muscles relax completely before doing another repetition. Repeat __________ times. Complete this exercise __________ times a day. Leg raises, prone This exercise strengthens the muscles that move the hips backward (hip extensors). Lie on your abdomen (prone position) on your bed or a firm surface. You can put a pillow under your hips if that is more comfortable for your lower back. Bend your left / right knee so your foot is straight up in the air. Squeeze your buttocks muscles and lift your left / right thigh off the bed. Do not let your back arch. Tense your thigh muscle as hard as you can without increasing any knee pain. Hold this position for __________ seconds. Slowly lower your leg to return to the starting position and allow it to relax completely. Repeat __________ times. Complete this exercise __________ times a day. Hip hike Stand sideways on a bottom step. Stand on your left / right leg with your other foot unsupported next to the step. You can hold on to a railing or wall for balance if needed. Keep your knees straight and your torso square. Then lift your left / right hip up toward the ceiling. Slowly let your left / right hip lower toward the floor, past the starting position. Your foot should get closer to the floor. Do not lean or bend your knees. Repeat __________ times. Complete this exercise __________ times a day. This information is not intended to replace advice given to you by your health care provider. Make sure you discuss any questions you have with your health care provider. Document Revised: 01/23/2020 Document Reviewed: 01/23/2020 Elsevier Patient Education  [employer].

## 2021-10-01 NOTE — Telephone Encounter (Signed)
Patient has appt and can receive Simponi sample at visit.  Pharmacy team will ensure PA is renewed  Knox Saliva, PharmD, MPH, BCPS Clinical Pharmacist (Rheumatology and Pulmonology)

## 2021-10-01 NOTE — Telephone Encounter (Signed)
PA request canceled by CMM due to medication being previously approved under authorization # [id number] from 01/16/2021 until 01/15/2022.

## 2021-10-01 NOTE — Telephone Encounter (Signed)
Submitted a Prior Authorization request to [facility] via CoverMyMeds. Will update once we receive a response.   Key: [id number]

## 2021-10-01 NOTE — Telephone Encounter (Signed)
Pending lab results, patient will be starting arava per Dr. Estanislado Pandy. Thanks!   Consent obtained and sent to the scan center.

## 2021-10-01 NOTE — Progress Notes (Signed)
Medication Samples have been provided to the patient.  Drug name: Simponi       Strength: 50mg /0.18mL        Qty: 1  LOT: [id number]  Exp.Date: 05/29/2023  Dosing instructions: INJECT 50MG  INTO THE SKIN EVERY 28 DAYS.

## 2021-10-01 NOTE — Telephone Encounter (Signed)
Sent email to [facility] to process patient's Simponi prescription  Knox Saliva, PharmD, MPH, BCPS Clinical Pharmacist (Rheumatology and Pulmonology)

## 2021-10-02 LAB — CBC WITH DIFFERENTIAL/PLATELET
Absolute Monocytes: 431 cells/uL (ref 200–950)
Basophils Absolute: 41 cells/uL (ref 0–200)
Basophils Relative: 0.7 %
Eosinophils Absolute: 71 cells/uL (ref 15–500)
Eosinophils Relative: 1.2 %
HCT: 40 % (ref 35.0–45.0)
Hemoglobin: 13.7 g/dL (ref 11.7–15.5)
Lymphs Abs: 1923 cells/uL (ref 850–3900)
MCH: 32.2 pg (ref 27.0–33.0)
MCHC: 34.3 g/dL (ref 32.0–36.0)
MCV: 93.9 fL (ref 80.0–100.0)
MPV: 10.9 fL (ref 7.5–12.5)
Monocytes Relative: 7.3 %
Neutro Abs: 3434 cells/uL (ref 1500–7800)
Neutrophils Relative %: 58.2 %
Platelets: 203 10*3/uL (ref 140–400)
RBC: 4.26 10*6/uL (ref 3.80–5.10)
RDW: 11.7 % (ref 11.0–15.0)
Total Lymphocyte: 32.6 %
WBC: 5.9 10*3/uL (ref 3.8–10.8)

## 2021-10-02 LAB — COMPLETE METABOLIC PANEL WITH GFR
AG Ratio: 1.9 (calc) (ref 1.0–2.5)
ALT: 13 U/L (ref 6–29)
AST: 20 U/L (ref 10–35)
Albumin: 4.4 g/dL (ref 3.6–5.1)
Alkaline phosphatase (APISO): 41 U/L (ref 31–125)
BUN: 18 mg/dL (ref 7–25)
CO2: 27 mmol/L (ref 20–32)
Calcium: 9.5 mg/dL (ref 8.6–10.2)
Chloride: 103 mmol/L (ref 98–110)
Creat: 0.83 mg/dL (ref 0.50–0.99)
Globulin: 2.3 g/dL (calc) (ref 1.9–3.7)
Glucose, Bld: 113 mg/dL — ABNORMAL HIGH (ref 65–99)
Potassium: 4.2 mmol/L (ref 3.5–5.3)
Sodium: 139 mmol/L (ref 135–146)
Total Bilirubin: 0.6 mg/dL (ref 0.2–1.2)
Total Protein: 6.7 g/dL (ref 6.1–8.1)
eGFR: 86 mL/min/{1.73_m2} (ref >=60)

## 2021-10-02 MED ORDER — LEFLUNOMIDE 10 MG PO TABS: ORAL_TABLET | ORAL | 0 refills | Status: DC

## 2021-10-02 NOTE — Progress Notes (Signed)
CBC and CMP are normal.  Glucose is mildly elevated as it was not a fasting sample.  We can send a prescription for leflunomide as discussed in the office yesterday.

## 2021-10-02 NOTE — Telephone Encounter (Signed)
CBC and CMP are normal.  Glucose is mildly elevated as it was not a fasting sample.  We can send a prescription for leflunomide as discussed in the office yesterday.   Per office note on 10/01/2021:  If her labs are stable we will call in a prescription for leflunomide 10 mg p.o. daily.  Will obtain labs in 2 weeks if labs are normal we will increase the dose of clindamycin to 20 mg p.o. daily.  She will get labs again in 2 weeks and then every 3 months to monitor for drug toxicity.

## 2021-10-16 DIAGNOSIS — M79641 Pain in right hand: Secondary | ICD-10-CM | POA: Diagnosis not present

## 2021-10-16 DIAGNOSIS — M79631 Pain in right forearm: Secondary | ICD-10-CM | POA: Diagnosis not present

## 2021-10-26 ENCOUNTER — Other Ambulatory Visit (HOSPITAL_COMMUNITY): Payer: Self-pay

## 2021-10-30 NOTE — Progress Notes (Deleted)
Office Visit Note  Patient: Samantha Graves             Date of Birth: Aug 29, 1971           MRN: [id number]             PCP: Fay Records, MD Referring: Fay Records, MD Visit Date: 11/13/2021 Occupation: @GUAROCC @  Subjective:  Medication monitoring   History of Present Illness: Samantha Graves is a 50 y.o. female with history of psoriatic arthritis.  Patient remains on Simponi 50 mg subcu injections every 28 days.  She was started on [place] after her last office visit.  Simponi 50 mg sq injections once every 28 days-started on 08/14/20. Inadequate response to Enbrel and Humira (she discontinued Humira due to fear of COVID-19 infection and immunosuppression)  Added arava at last visit   Activities of Daily Living:  Patient reports morning stiffness for *** {minute/hour:19697}.   Patient {ACTIONS;DENIES/REPORTS:21021675::"Denies"} nocturnal pain.  Difficulty dressing/grooming: {ACTIONS;DENIES/REPORTS:21021675::"Denies"} Difficulty climbing stairs: {ACTIONS;DENIES/REPORTS:21021675::"Denies"} Difficulty getting out of chair: {ACTIONS;DENIES/REPORTS:21021675::"Denies"} Difficulty using hands for taps, buttons, cutlery, and/or writing: {ACTIONS;DENIES/REPORTS:21021675::"Denies"}  No Rheumatology ROS completed.   PMFS History:  Patient Active Problem List   Diagnosis Date Noted   Psoriasis 01/11/2017   High risk medication use 01/11/2017   PSORIATIC ARTHRITIS 10/14/2009   ALLERGIC RHINITIS 09/06/2007    Past Medical History:  Diagnosis Date   ADHD    Bile duct injury 2020   mild   Cholecystitis    Elevated LFTs    MGUS (monoclonal gammopathy of unknown significance) 2021   PONV (postoperative nausea and vomiting)    Psoriatic arthritis ([place])    Rib fracture     Family History  Problem Relation Age of Onset   Hyperlipidemia Mother    Ovarian cysts Mother        benign   Endometriosis Mother    Liver disease Mother        NASH   Other Father        auto  imune   Coronary artery disease Father        cardio ablation   COPD Maternal Grandmother    Bone cancer Maternal Grandfather 85   Arthritis/Rheumatoid Sister 56   Endometriosis Sister    Retinitis pigmentosa Sister 52   Ulcerative colitis Sister 21       surgical repair, J pouch   Ovarian cancer Sister 79       negative genetic testing   Liver disease Maternal Aunt        NASH   Colon cancer Neg Hx    Esophageal cancer Neg Hx    Pancreatic cancer Neg Hx    Stomach cancer Neg Hx    Past Surgical History:  Procedure Laterality Date   CHOLECYSTECTOMY N/A 08/13/2019   Procedure: LAPAROSCOPIC CHOLECYSTECTOMY;  Surgeon: Rolm Bookbinder, MD;  Location: [place];  Service: General;  Laterality: N/A;   INTRAUTERINE DEVICE INSERTION  05/30/15, 05/06/10   MIrena IUD   TONSILLECTOMY AND ADENOIDECTOMY  1990   WISDOM TOOTH EXTRACTION Bilateral 1991   Social History   Social History Narrative   Not on file   Immunization History  Administered Date(s) Administered   Influenza Whole 08/30/2003   Influenza,inj,Quad PF,6+ Mos 09/01/2015, 09/14/2019, 11/14/2020   PFIZER(Purple Top)SARS-COV-2 Vaccination 02/09/2020, 03/03/2020, 09/02/2020   Tdap 02/25/2011, 02/17/2021     Objective: Vital Signs: There were no vitals taken for this visit.   Physical Exam   Musculoskeletal Exam: ***  CDAI Exam: CDAI Score: -- Patient Global: --; Provider Global: -- Swollen: --; Tender: -- Joint Exam 11/13/2021   No joint exam has been documented for this visit   There is currently no information documented on the homunculus. Go to the Rheumatology activity and complete the homunculus joint exam.  Investigation: No additional findings.  Imaging: No results found.  Recent Labs: Lab Results  Component Value Date   WBC 5.9 10/01/2021   HGB 13.7 10/01/2021   PLT 203 10/01/2021   NA 139 10/01/2021   K 4.2 10/01/2021   CL 103 10/01/2021   CO2 27 10/01/2021   GLUCOSE 113 (H) 10/01/2021   BUN  18 10/01/2021   CREATININE 0.83 10/01/2021   BILITOT 0.6 10/01/2021   ALKPHOS 41 03/17/2021   AST 20 10/01/2021   ALT 13 10/01/2021   PROT 6.7 10/01/2021   ALBUMIN 4.2 03/17/2021   CALCIUM 9.5 10/01/2021   GFRAA 107 03/16/2021   QFTBGOLD NEGATIVE 11/04/2017   QFTBGOLDPLUS NEGATIVE 03/16/2021    Speciality Comments: Enbrel, Humira-inadequate response Simponi-04/22  Procedures:  No procedures performed Allergies: Tamiflu [oseltamivir phosphate]   Assessment / Plan:     Visit Diagnoses: Psoriatic arthritis ([place])  Psoriasis  High risk medication use  Chronic SI joint pain  Trochanteric bursitis of both hips  Neck pain  Plantar fasciitis  Elevated LFTs  Monoclonal gammopathy  Orders: No orders of the defined types were placed in this encounter.  No orders of the defined types were placed in this encounter.   Face-to-face time spent with patient was *** minutes. Greater than 50% of time was spent in counseling and coordination of care.  Follow-Up Instructions: No follow-ups on file.   Samantha Neas, PA-C  Note - This record has been created using Dragon software.  Chart creation errors have been sought, but may not always  have been located. Such creation errors do not reflect on  the standard of medical care.

## 2021-11-13 ENCOUNTER — Ambulatory Visit: Payer: BC Managed Care – PPO | Admitting: Physician Assistant

## 2021-11-13 DIAGNOSIS — R7989 Other specified abnormal findings of blood chemistry: Secondary | ICD-10-CM

## 2021-11-13 DIAGNOSIS — G8929 Other chronic pain: Secondary | ICD-10-CM

## 2021-11-13 DIAGNOSIS — D472 Monoclonal gammopathy: Secondary | ICD-10-CM

## 2021-11-13 DIAGNOSIS — M7061 Trochanteric bursitis, right hip: Secondary | ICD-10-CM

## 2021-11-13 DIAGNOSIS — M542 Cervicalgia: Secondary | ICD-10-CM

## 2021-11-13 DIAGNOSIS — M722 Plantar fascial fibromatosis: Secondary | ICD-10-CM

## 2021-11-13 DIAGNOSIS — Z79899 Other long term (current) drug therapy: Secondary | ICD-10-CM

## 2021-11-13 DIAGNOSIS — L409 Psoriasis, unspecified: Secondary | ICD-10-CM

## 2021-11-13 DIAGNOSIS — L4050 Arthropathic psoriasis, unspecified: Secondary | ICD-10-CM

## 2021-11-16 ENCOUNTER — Encounter: Payer: Self-pay | Admitting: Physician Assistant

## 2021-11-16 ENCOUNTER — Ambulatory Visit (INDEPENDENT_AMBULATORY_CARE_PROVIDER_SITE_OTHER): Payer: BC Managed Care – PPO | Admitting: Physician Assistant

## 2021-11-16 ENCOUNTER — Other Ambulatory Visit: Payer: Self-pay

## 2021-11-16 VITALS — BP 126/84 | HR 71 | Ht 67.25 in | Wt 141.0 lb

## 2021-11-16 DIAGNOSIS — M722 Plantar fascial fibromatosis: Secondary | ICD-10-CM

## 2021-11-16 DIAGNOSIS — M7062 Trochanteric bursitis, left hip: Secondary | ICD-10-CM

## 2021-11-16 DIAGNOSIS — M533 Sacrococcygeal disorders, not elsewhere classified: Secondary | ICD-10-CM | POA: Diagnosis not present

## 2021-11-16 DIAGNOSIS — L409 Psoriasis, unspecified: Secondary | ICD-10-CM | POA: Diagnosis not present

## 2021-11-16 DIAGNOSIS — G8929 Other chronic pain: Secondary | ICD-10-CM

## 2021-11-16 DIAGNOSIS — Z79899 Other long term (current) drug therapy: Secondary | ICD-10-CM

## 2021-11-16 DIAGNOSIS — R7989 Other specified abnormal findings of blood chemistry: Secondary | ICD-10-CM

## 2021-11-16 DIAGNOSIS — M7061 Trochanteric bursitis, right hip: Secondary | ICD-10-CM

## 2021-11-16 DIAGNOSIS — D472 Monoclonal gammopathy: Secondary | ICD-10-CM

## 2021-11-16 DIAGNOSIS — L4050 Arthropathic psoriasis, unspecified: Secondary | ICD-10-CM

## 2021-11-16 MED ORDER — METHOCARBAMOL 500 MG PO TABS: 500.0000 mg | ORAL_TABLET | Freq: Every day | ORAL | 0 refills | Status: DC | PRN

## 2021-11-16 NOTE — Progress Notes (Signed)
Office Visit Note  Patient: Samantha Graves             Date of Birth: 11-May-1971           MRN: [id number]             PCP: Fay Records, MD Referring: Fay Records, MD Visit Date: 11/16/2021 Occupation: @GUAROCC @  Subjective:  Discuss arava   History of Present Illness: Samantha Graves is a 50 y.o. female with history of psoriatic arthritis.  She is on Simponi 50 mg subcutaneous injections every 28 days.  She did not start on [place] after her last office visit at the beginning of November due to being apprehensive of potential side effects.  She was concerned about the possibility of a rise in her liver enzymes.  She has not been taking any Tylenol or NSAIDs.  Her discomfort has been manageable.  Her left hip pain has improved since having a cortisone injection performed on 10/01/2021.  She has continued home exercises as recommended.  She denies any joint swelling at this time.  She has some psoriasis on her scalp which has been tolerable with Neutrogena shampoo with Tgel.     Activities of Daily Living:  Patient reports morning stiffness for a couple of hours.   Patient Denies nocturnal pain.  Difficulty dressing/grooming: Denies Difficulty climbing stairs: Denies Difficulty getting out of chair: Denies Difficulty using hands for taps, buttons, cutlery, and/or writing: Denies  Review of Systems  Constitutional:  Positive for fatigue.  HENT:  Negative for mouth sores, mouth dryness and nose dryness.   Eyes:  Negative for pain, itching and dryness.  Respiratory:  Negative for shortness of breath and difficulty breathing.   Cardiovascular:  Negative for chest pain and palpitations.  Gastrointestinal:  Negative for blood in stool, constipation and diarrhea.  Endocrine: Negative for increased urination.  Genitourinary:  Negative for difficulty urinating.  Musculoskeletal:  Positive for myalgias, morning stiffness, muscle tenderness and myalgias. Negative for joint pain,  joint pain and joint swelling.  Skin:  Negative for color change, rash and redness.  Allergic/Immunologic: Negative for susceptible to infections.  Neurological:  Positive for numbness. Negative for dizziness, headaches, memory loss and weakness.  Hematological:  Negative for bruising/bleeding tendency.  Psychiatric/Behavioral:  Negative for confusion.    PMFS History:  Patient Active Problem List   Diagnosis Date Noted   Psoriasis 01/11/2017   High risk medication use 01/11/2017   PSORIATIC ARTHRITIS 10/14/2009   ALLERGIC RHINITIS 09/06/2007    Past Medical History:  Diagnosis Date   ADHD    Bile duct injury 2020   mild   Cholecystitis    Elevated LFTs    MGUS (monoclonal gammopathy of unknown significance) 2021   PONV (postoperative nausea and vomiting)    Psoriatic arthritis ([place])    Rib fracture     Family History  Problem Relation Age of Onset   Hyperlipidemia Mother    Ovarian cysts Mother        benign   Endometriosis Mother    Liver disease Mother        NASH   Other Father        auto imune   Coronary artery disease Father        cardio ablation   COPD Maternal Grandmother    Bone cancer Maternal Grandfather 56   Arthritis/Rheumatoid Sister 85   Endometriosis Sister    Retinitis pigmentosa Sister 52   Ulcerative colitis Sister 26  surgical repair, J pouch   Ovarian cancer Sister 67       negative genetic testing   Liver disease Maternal Aunt        NASH   Colon cancer Neg Hx    Esophageal cancer Neg Hx    Pancreatic cancer Neg Hx    Stomach cancer Neg Hx    Past Surgical History:  Procedure Laterality Date   CHOLECYSTECTOMY N/A 08/13/2019   Procedure: LAPAROSCOPIC CHOLECYSTECTOMY;  Surgeon: Rolm Bookbinder, MD;  Location: [facility];  Service: General;  Laterality: N/A;   INTRAUTERINE DEVICE INSERTION  05/30/15, 05/06/10   MIrena IUD   TONSILLECTOMY AND [place] EXTRACTION Bilateral 1991   Social History   Social  History Narrative   Not on file   Immunization History  Administered Date(s) Administered   Influenza Whole 08/30/2003   Influenza,inj,Quad PF,6+ Mos 09/01/2015, 09/14/2019, 11/14/2020   PFIZER(Purple Top)SARS-COV-2 Vaccination 02/09/2020, 03/03/2020, 09/02/2020   Tdap 02/25/2011, 02/17/2021     Objective: Vital Signs: BP 126/84 (BP Location: Left Arm, Patient Position: Sitting, Cuff Size: Normal)    Pulse 71    Ht 5' 7.25" (1.708 m)    Wt 141 lb (64 kg)    BMI 21.92 kg/m    Physical Exam Vitals and nursing note reviewed.  Constitutional:      Appearance: She is well-developed.  HENT:     Head: Normocephalic and atraumatic.  Eyes:     Conjunctiva/sclera: Conjunctivae normal.  Pulmonary:     Effort: Pulmonary effort is normal.  Abdominal:     Palpations: Abdomen is soft.  Musculoskeletal:     Cervical back: Normal range of motion.  Skin:    General: Skin is warm and dry.     Capillary Refill: Capillary refill takes less than 2 seconds.  Neurological:     Mental Status: She is alert and oriented to person, place, and time.  Psychiatric:        Behavior: Behavior normal.     Musculoskeletal Exam: C-spine, thoracic spine, and lumbar spine good ROM.  No midline spinal tenderness or SI joint tenderness.  Shoulder joints, elbow joints, wrist joints, MCPs, PIPs, DIPs have good range of motion with no synovitis.  Complete fist formation bilaterally.  Hip joints have good range of motion with no groin pain.  Some tenderness over the left trochanteric bursa.  Knee joints have good range of motion with no warmth or effusion.  Ankle joints have good range of motion with no tenderness or joint swelling.  No evidence of Achilles tendinitis or plantar fasciitis.  CDAI Exam: CDAI Score: -- Patient Global: --; Provider Global: -- Swollen: --; Tender: -- Joint Exam 11/16/2021   No joint exam has been documented for this visit   There is currently no information documented on the  homunculus. Go to the Rheumatology activity and complete the homunculus joint exam.  Investigation: No additional findings.  Imaging: No results found.  Recent Labs: Lab Results  Component Value Date   WBC 5.9 10/01/2021   HGB 13.7 10/01/2021   PLT 203 10/01/2021   NA 139 10/01/2021   K 4.2 10/01/2021   CL 103 10/01/2021   CO2 27 10/01/2021   GLUCOSE 113 (H) 10/01/2021   BUN 18 10/01/2021   CREATININE 0.83 10/01/2021   BILITOT 0.6 10/01/2021   ALKPHOS 41 03/17/2021   AST 20 10/01/2021   ALT 13 10/01/2021   PROT 6.7 10/01/2021   ALBUMIN 4.2 03/17/2021  CALCIUM 9.5 10/01/2021   GFRAA 107 03/16/2021   QFTBGOLD NEGATIVE 11/04/2017   QFTBGOLDPLUS NEGATIVE 03/16/2021    Speciality Comments: Enbrel, Humira-inadequate response Simponi-04/22  Procedures:  No procedures performed Allergies: Tamiflu [oseltamivir phosphate]   Assessment / Plan:     Visit Diagnoses: Psoriatic arthritis ([place]): She has no synovitis or dactylitis on examination today.  She remains on Simponi 50 mg sq injections every 28 days as monotherapy.  She did not start on [place] as discussed at her last office visit on 10/01/2021.  She was apprehensive of potential side effects as well as the potential for elevated LFTs.  Her arthralgias and joint stiffness have been manageable.  She has not been taking any over-the-counter products for pain relief.  She remains active exercising on a daily basis.  She has no evidence of Achilles tendinitis or plantar fasciitis.  She had no SI joint tenderness to palpation.  She will remain on Simponi as monotherapy.  She was advised to notify us if she develops increased joint pain or joint swelling.  She will follow-up in the office in 3 months.  Psoriasis: Scalp-She has been using over-the-counter Neutrogena T-Gel shampoo.  The prescription for clobetasol shampoo which was sent to the pharmacy in May was too expensive.  She will remain on Simponi as prescribed.  She was advised to  notify us if she develops any new or worsening patches.  High risk medication use: Simponi 50 mg subcutaneous injections every 28 days.  CBC and CMP updated on 10/01/21. She will be due to update lab work in February and every 3 months. Standing orders for CBC and CMP are in place. TB gold negative 03/16/21.   Discussed the importance of holding Simponi if she develops signs or symptoms of an infection and to resume once the infection has completely cleared.  She voiced understanding. My note was provided stating that none of the medication prescribed by our office had a contraindication with getting Botox performed.  Chronic SI joint pain: Resolved.  She has no tenderness to palpation over both SI joints.  She has not had any nocturnal pain or increased morning stiffness.  Trochanteric bursitis of both hips: She has good range of motion of both hip joints with no groin pain on exam.  She had a left trochanteric bursa cortisone injection performed on 10/01/2021 which provided significant relief.  She continues to perform stretching exercises on a daily basis.  Plantar fasciitis: Resolved.  She is wearing proper fitting shoes.  Elevated LFTs: LFTs within normal limits on 10/01/2021.  She has been avoiding Tylenol and NSAID use.  She was apprehensive to start on a [place] due to the possibility of raising LFTs.  She does not want to start on a [place] at this time.  Other medical conditions are listed as follows:  Monoclonal gammopathy  Orders: Orders Placed This Encounter  Procedures   CBC with Differential/Platelet   COMPLETE METABOLIC PANEL WITH GFR   Meds ordered this encounter  Medications   methocarbamol (ROBAXIN) 500 MG tablet    Sig: Take 1 tablet (500 mg total) by mouth daily as needed for muscle spasms.    Dispense:  14 tablet    Refill:  0      Follow-Up Instructions: Return in 3 months (on 02/14/2022) for Psoriatic arthritis.   Ofilia Neas, PA-C  Note - This record has  been created using Dragon software.  Chart creation errors have been sought, but may not always  have been  located. Such creation errors do not reflect on  the standard of medical care.

## 2021-11-16 NOTE — Patient Instructions (Signed)
Standing Labs We placed an order today for your standing lab work.   Please have your standing labs drawn in February and every 3 months   If possible, please have your labs drawn 2 weeks prior to your appointment so that the provider can discuss your results at your appointment.  Please note that you may see your imaging and lab results in MyChart before we have reviewed them. We may be awaiting multiple results to interpret others before contacting you. Please allow our office up to 72 hours to thoroughly review all of the results before contacting the office for clarification of your results.  We have open lab daily: Monday through Thursday from 1:30-4:30 PM and Friday from 1:30-4:00 PM at the office of Dr. Shaili Deveshwar, [facility].   Please be advised, all patients with office appointments requiring lab work will take precedent over walk-in lab work.  If possible, please come for your lab work on Monday and Friday afternoons, as you may experience shorter wait times. The office is located at [address], [address], [place], [place] [address] No appointment is necessary.   Labs are drawn by [facility]. Please bring your co-pay at the time of your lab draw.  You may receive a bill from [facility] for your lab work.  If you wish to have your labs drawn at another location, please call the office 24 hours in advance to send orders.  If you have any questions regarding directions or hours of operation,  please call [phone].   As a reminder, please drink plenty of water prior to coming for your lab work. Thanks!  

## 2021-11-17 DIAGNOSIS — Z23 Encounter for immunization: Secondary | ICD-10-CM | POA: Diagnosis not present

## 2021-12-04 ENCOUNTER — Telehealth: Payer: Self-pay

## 2021-12-04 ENCOUNTER — Other Ambulatory Visit (HOSPITAL_COMMUNITY): Payer: Self-pay

## 2021-12-04 ENCOUNTER — Other Ambulatory Visit: Payer: Self-pay | Admitting: Physician Assistant

## 2021-12-04 DIAGNOSIS — L4050 Arthropathic psoriasis, unspecified: Secondary | ICD-10-CM

## 2021-12-04 MED ORDER — SIMPONI 50 MG/0.5ML ~~LOC~~ SOAJ
SUBCUTANEOUS | 2 refills | Status: DC
  Filled 2021-12-04 – 2021-12-25 (×2): qty 0.5, 28d supply, fill #0
  Filled 2022-01-19: qty 0.5, 28d supply, fill #1
  Filled 2022-02-12: qty 0.5, 28d supply, fill #2

## 2021-12-04 NOTE — Telephone Encounter (Signed)
Due to high-deductible plan, patient required additional copay savings. Applied for copay card through [employer].  RxBIN: [id number] RxPCN: N/A RxGRP: [id number] ID: [id number]  Copy of card sent to scan center.

## 2021-12-04 NOTE — Telephone Encounter (Signed)
Next Visit: 02/16/2022  Last Visit: 11/16/2021  Last Fill: 06/30/2021   WI:OMBTDHRCB arthritis   Current Dose per office note 11/16/2021: Simponi 50 mg subcutaneous injections every 28 days.  Labs: 10/01/2021 CBC and CMP are normal.  Glucose is mildly elevated as it was not a fasting sample.   TB Gold: 03/16/2021 Neg   Okay to refill Simponi?

## 2021-12-12 ENCOUNTER — Other Ambulatory Visit (HOSPITAL_COMMUNITY): Payer: Self-pay

## 2021-12-23 ENCOUNTER — Other Ambulatory Visit (HOSPITAL_COMMUNITY): Payer: Self-pay

## 2021-12-25 ENCOUNTER — Other Ambulatory Visit (HOSPITAL_COMMUNITY): Payer: Self-pay

## 2021-12-28 ENCOUNTER — Other Ambulatory Visit (HOSPITAL_COMMUNITY): Payer: Self-pay

## 2022-01-06 ENCOUNTER — Other Ambulatory Visit (HOSPITAL_COMMUNITY): Payer: Self-pay

## 2022-01-06 NOTE — Telephone Encounter (Signed)
Per Rinaldo Ratel, current Prior Authorization is expiring.  Submitted a Prior Authorization request to [facility] for [facility] via CoverMyMeds. Will update once we receive a response.   Key: [id number]

## 2022-01-06 NOTE — Telephone Encounter (Signed)
Received notification from [facility] regarding a prior authorization for [facility]. Authorization has been APPROVED from 01/06/2022 to 01/05/2023.  [employer] has been updated with new PA info.  Authorization # [id number]

## 2022-01-19 ENCOUNTER — Other Ambulatory Visit (HOSPITAL_COMMUNITY): Payer: Self-pay

## 2022-01-20 ENCOUNTER — Other Ambulatory Visit (HOSPITAL_COMMUNITY): Payer: Self-pay

## 2022-02-12 ENCOUNTER — Other Ambulatory Visit (HOSPITAL_COMMUNITY): Payer: Self-pay

## 2022-02-15 NOTE — Progress Notes (Signed)
? ?Office Visit Note ? ?Patient: Samantha Graves             ?Date of Birth: [birth year]           ?MRN: [id number]             ?PCP: Fay Records, MD ?Referring: Fay Records, MD ?Visit Date: 02/17/2022 ?Occupation: '@GUAROCC'$ @ ? ?Subjective:  ?Left hip pain ? ?History of Present Illness: Samantha Graves is a 51 y.o. female with  history of psoriatic arthritis and psoriasis.  She is currently on Simponi 50 mg subcu injections.  She states she continues to have some stiffness in her joints and between the Simponi injection.  She was given a prescription of leflunomide in November but she did not start as she was concerned about the possible side effect of elevated LFTs.  She takes Tylenol and NSAIDs on as needed basis.  She states she continues to have pain and discomfort in her left trochanteric bursa.  She has difficulty sleeping on her side.  She denies any joint swelling.  She continues to have some psoriasis patches on her elbows and her scalp.  She has been using over-the-counter products which has been helpful. ? ?Activities of Daily Living:  ?Patient reports morning stiffness for 2-3 hours.   ?Patient Reports nocturnal pain.  ?Difficulty dressing/grooming: Reports ?Difficulty climbing stairs: Denies ?Difficulty getting out of chair: Denies ?Difficulty using hands for taps, buttons, cutlery, and/or writing: Denies ? ?Review of Systems  ?Constitutional:  Negative for fatigue.  ?HENT:  Negative for mouth sores, mouth dryness and nose dryness.   ?Eyes:  Negative for pain, itching and dryness.  ?Respiratory:  Negative for shortness of breath and difficulty breathing.   ?Cardiovascular:  Negative for chest pain and palpitations.  ?Gastrointestinal:  Negative for blood in stool, constipation and diarrhea.  ?Endocrine: Negative for increased urination.  ?Genitourinary:  Negative for difficulty urinating.  ?Musculoskeletal:  Positive for joint pain, joint pain and morning stiffness. Negative for joint  swelling, myalgias, muscle tenderness and myalgias.  ?Skin:  Negative for color change, rash and redness.  ?Allergic/Immunologic: Positive for susceptible to infections.  ?Neurological:  Positive for numbness. Negative for dizziness, headaches, memory loss and weakness.  ?Hematological:  Negative for bruising/bleeding tendency.  ?Psychiatric/Behavioral:  Negative for confusion.   ? ?PMFS History:  ?Patient Active Problem List  ? Diagnosis Date Noted  ? Psoriasis 01/11/2017  ? High risk medication use 01/11/2017  ? PSORIATIC ARTHRITIS 10/14/2009  ? ALLERGIC RHINITIS 09/06/2007  ?  ?Past Medical History:  ?Diagnosis Date  ? ADHD   ? Bile duct injury 2020  ? mild  ? Cholecystitis   ? Elevated LFTs   ? MGUS (monoclonal gammopathy of unknown significance) 2021  ? PONV (postoperative nausea and vomiting)   ? Psoriatic arthritis ([place])   ? Rib fracture   ?  ?Family History  ?Problem Relation Age of Onset  ? Hyperlipidemia Mother   ? Ovarian cysts Mother   ?     benign  ? Endometriosis Mother   ? Liver disease Mother   ?     NASH  ? Other Father   ?     auto imune  ? Coronary artery disease Father   ?     cardio ablation  ? COPD Maternal Grandmother   ? Bone cancer Maternal Grandfather 90  ? Arthritis/Rheumatoid Sister 21  ? Endometriosis Sister   ? Retinitis pigmentosa Sister 62  ? Ulcerative colitis Sister 28  ?  surgical repair, J pouch  ? Ovarian cancer Sister 83  ?     negative genetic testing  ? Liver disease Maternal Aunt   ?     NASH  ? Colon cancer Neg Hx   ? Esophageal cancer Neg Hx   ? Pancreatic cancer Neg Hx   ? Stomach cancer Neg Hx   ? ?Past Surgical History:  ?Procedure Laterality Date  ? CHOLECYSTECTOMY N/A 08/13/2019  ? Procedure: LAPAROSCOPIC CHOLECYSTECTOMY;  Surgeon: Rolm Bookbinder, MD;  Location: [place];  Service: General;  Laterality: N/A;  ? INTRAUTERINE DEVICE INSERTION  05/30/15, 05/06/10  ? MIrena IUD  ? TONSILLECTOMY AND ADENOIDECTOMY  1990  ? [place] EXTRACTION Bilateral 1991  ? ?Social  History  ? ?Social History Narrative  ? Not on file  ? ?Immunization History  ?Administered Date(s) Administered  ? Influenza Whole 08/30/2003  ? Influenza,inj,Quad PF,6+ Mos 09/01/2015, 09/14/2019, 11/14/2020  ? PFIZER(Purple Top)SARS-COV-2 Vaccination 02/09/2020, 03/03/2020, 09/02/2020  ? Tdap 02/25/2011, 02/17/2021  ?  ? ?Objective: ?Vital Signs: BP 109/73 (BP Location: Left Arm, Patient Position: Sitting, Cuff Size: Normal)   Pulse 72   Ht 5' 7.5" (1.715 m)   Wt 154 lb 3.2 oz (69.9 kg)   BMI 23.79 kg/m?   ? ?Physical Exam ?Vitals and nursing note reviewed.  ?Constitutional:   ?   Appearance: She is well-developed.  ?HENT:  ?   Head: Normocephalic and atraumatic.  ?Eyes:  ?   Conjunctiva/sclera: Conjunctivae normal.  ?Cardiovascular:  ?   Rate and Rhythm: Normal rate and regular rhythm.  ?   Heart sounds: Normal heart sounds.  ?Pulmonary:  ?   Effort: Pulmonary effort is normal.  ?   Breath sounds: Normal breath sounds.  ?Abdominal:  ?   General: Bowel sounds are normal.  ?   Palpations: Abdomen is soft.  ?Musculoskeletal:  ?   Cervical back: Normal range of motion.  ?Lymphadenopathy:  ?   Cervical: No cervical adenopathy.  ?Skin: ?   General: Skin is warm and dry.  ?   Capillary Refill: Capillary refill takes less than 2 seconds.  ?   Comments: Mild erythema was noted over her right elbow.  ?Neurological:  ?   Mental Status: She is alert and oriented to person, place, and time.  ?Psychiatric:     ?   Behavior: Behavior normal.  ?  ? ?Musculoskeletal Exam: C-spine thoracic and lumbar spine with good range of motion.  She had no SI joint tenderness.  Shoulder joints, elbow joints, wrist joints, MCPs PIPs and DIPs with good range of motion with no synovitis.  Hip joints, knee joints, ankles, MTPs and PIPs with good range of motion with no synovitis.  She had tenderness on palpation over left trochanteric bursa. ? ?CDAI Exam: ?CDAI Score: -- ?Patient Global: --; Provider Global: -- ?Swollen: --; Tender:  -- ?Joint Exam 02/17/2022  ? ?No joint exam has been documented for this visit  ? ?There is currently no information documented on the homunculus. Go to the Rheumatology activity and complete the homunculus joint exam. ? ?Investigation: ?No additional findings. ? ?Imaging: ?No results found. ? ?Recent Labs: ?Lab Results  ?Component Value Date  ? WBC 5.9 10/01/2021  ? HGB 13.7 10/01/2021  ? PLT 203 10/01/2021  ? NA 139 10/01/2021  ? K 4.2 10/01/2021  ? CL 103 10/01/2021  ? CO2 27 10/01/2021  ? GLUCOSE 113 (H) 10/01/2021  ? BUN 18 10/01/2021  ? CREATININE 0.83 10/01/2021  ? BILITOT  0.6 10/01/2021  ? ALKPHOS 41 03/17/2021  ? AST 20 10/01/2021  ? ALT 13 10/01/2021  ? PROT 6.7 10/01/2021  ? ALBUMIN 4.2 03/17/2021  ? CALCIUM 9.5 10/01/2021  ? GFRAA 107 03/16/2021  ? QFTBGOLD NEGATIVE 11/04/2017  ? QFTBGOLDPLUS NEGATIVE 03/16/2021  ? ? ?Speciality Comments: Enbrel, Humira-inadequate response ?Simponi-04/22 ? ?Procedures:  ?Large Joint Inj: L greater trochanter on 02/17/2022 2:00 PM ?Indications: pain ?Details: 27 G 1.5 in needle, lateral approach ? ?Arthrogram: No ? ?Medications: 40 mg triamcinolone acetonide 40 MG/ML; 1.5 mL lidocaine 1 % ?Aspirate: 0 mL ?Outcome: tolerated well, no immediate complications ?Procedure, treatment alternatives, risks and benefits explained, specific risks discussed. Consent was given by the patient. Immediately prior to procedure a time out was called to verify the correct patient, procedure, equipment, support staff and site/side marked as required. Patient was prepped and draped in the usual sterile fashion.  ? ? ?Allergies: Tamiflu [oseltamivir phosphate]  ? ?Assessment / Plan:     ?Visit Diagnoses: Psoriatic arthritis ([facility])-she continues to have some stiffness in her joints and between Simponi injection.  No synovitis was noted.  We discussed leflunomide back in November 2022.  She did not like the idea of adding leflunomide as she was concerned about possible side effect of elevated  LFTs.  At this point she does not want to add any additional medications. ? ?Psoriasis-she gets free psoriasis patches on her scalp.  She uses over-the-counter shampoo mostly T-Gel shampoo.  She also had some rash on her elbow

## 2022-02-16 ENCOUNTER — Other Ambulatory Visit (HOSPITAL_COMMUNITY): Payer: Self-pay

## 2022-02-16 ENCOUNTER — Ambulatory Visit: Payer: BC Managed Care – PPO | Admitting: Rheumatology

## 2022-02-17 ENCOUNTER — Ambulatory Visit (INDEPENDENT_AMBULATORY_CARE_PROVIDER_SITE_OTHER): Payer: BC Managed Care – PPO | Admitting: Rheumatology

## 2022-02-17 ENCOUNTER — Encounter: Payer: Self-pay | Admitting: Rheumatology

## 2022-02-17 ENCOUNTER — Other Ambulatory Visit: Payer: Self-pay

## 2022-02-17 VITALS — BP 109/73 | HR 72 | Ht 67.5 in | Wt 154.2 lb

## 2022-02-17 DIAGNOSIS — D472 Monoclonal gammopathy: Secondary | ICD-10-CM

## 2022-02-17 DIAGNOSIS — M533 Sacrococcygeal disorders, not elsewhere classified: Secondary | ICD-10-CM | POA: Diagnosis not present

## 2022-02-17 DIAGNOSIS — R7989 Other specified abnormal findings of blood chemistry: Secondary | ICD-10-CM

## 2022-02-17 DIAGNOSIS — M722 Plantar fascial fibromatosis: Secondary | ICD-10-CM

## 2022-02-17 DIAGNOSIS — L409 Psoriasis, unspecified: Secondary | ICD-10-CM

## 2022-02-17 DIAGNOSIS — G8929 Other chronic pain: Secondary | ICD-10-CM

## 2022-02-17 DIAGNOSIS — M7061 Trochanteric bursitis, right hip: Secondary | ICD-10-CM

## 2022-02-17 DIAGNOSIS — M7062 Trochanteric bursitis, left hip: Secondary | ICD-10-CM | POA: Diagnosis not present

## 2022-02-17 DIAGNOSIS — Z79899 Other long term (current) drug therapy: Secondary | ICD-10-CM | POA: Diagnosis not present

## 2022-02-17 DIAGNOSIS — L4050 Arthropathic psoriasis, unspecified: Secondary | ICD-10-CM

## 2022-02-17 MED ORDER — METHOCARBAMOL 500 MG PO TABS: 500.0000 mg | ORAL_TABLET | Freq: Every day | ORAL | 0 refills | Status: DC | PRN

## 2022-02-17 MED ORDER — LIDOCAINE HCL 1 % IJ SOLN
1.5000 mL | INTRAMUSCULAR | Status: AC | PRN
  Administered 2022-02-17: 1.5 mL

## 2022-02-17 MED ORDER — TRIAMCINOLONE ACETONIDE 40 MG/ML IJ SUSP
40.0000 mg | INTRAMUSCULAR | Status: AC | PRN
  Administered 2022-02-17: 40 mg via INTRA_ARTICULAR

## 2022-02-17 NOTE — Patient Instructions (Signed)
Standing Labs ?We placed an order today for your standing lab work.  ? ?Please have your standing labs drawn in June and every 3 months ? ?If possible, please have your labs drawn 2 weeks prior to your appointment so that the provider can discuss your results at your appointment. ? ?Please note that you may see your imaging and lab results in [facility] before we have reviewed them. ?We may be awaiting multiple results to interpret others before contacting you. ?Please allow our office up to 72 hours to thoroughly review all of the results before contacting the office for clarification of your results. ? ?We have open lab daily: ?Monday through Thursday from 1:30-4:30 PM and Friday from 1:30-4:00 PM ?at the office of Dr. Bo Merino, [facility].   ?Please be advised, all patients with office appointments requiring lab work will take precedent over walk-in lab work.  ?If possible, please come for your lab work on Monday and Friday afternoons, as you may experience shorter wait times. ?The office is located at [address], [place], [place], [place] [address] ?No appointment is necessary.   ?Labs are drawn by [facility]. Please bring your co-pay at the time of your lab draw.  You may receive a bill from [facility] for your lab work. ? ?Please note if you are on Hydroxychloroquine and and an order has been placed for a Hydroxychloroquine level, you will need to have it drawn 4 hours or more after your last dose. ? ?If you wish to have your labs drawn at another location, please call the office 24 hours in advance to send orders. ? ?If you have any questions regarding directions or hours of operation,  ?please call [phone].   ?As a reminder, please drink plenty of water prior to coming for your lab work. Thanks!  ? ?Vaccines ?You are taking a medication(s) that can suppress your immune system.  The following immunizations are recommended: ?Flu annually ?Covid-19  ?Td/Tdap (tetanus, diphtheria, pertussis)  every 10 years ?Pneumonia (Prevnar 15 then Pneumovax 23 at least 1 year apart.  Alternatively, can take Prevnar 20 without needing additional dose) ?Shingrix: 2 doses from 4 weeks to 6 months apart ? ?Please check with your PCP to make sure you are up to date.  ? ?If you have signs or symptoms of an infection or start antibiotics: ?First, call your PCP for workup of your infection. ?Hold your medication through the infection, until you complete your antibiotics, and until symptoms resolve if you take the following: ?Injectable medication (Actemra, Benlysta, Cimzia, Cosentyx, Enbrel, Humira, Kevzara, Orencia, Remicade, Simponi, [place], [place], [place]) ?Methotrexate ?Leflunomide Jolee Ewing) ?Mycophenolate (Cellcept) ?Roma Kayser, or Rinvoq  ?Iliotibial Band Syndrome Rehab ?Ask your health care provider which exercises are safe for you. Do exercises exactly as told by your health care provider and adjust them as directed. It is normal to feel mild stretching, pulling, tightness, or discomfort as you do these exercises. Stop right away if you feel sudden pain or your pain gets significantly worse. Do not begin these exercises until told by your health care provider. ?Stretching and range-of-motion exercises ?These exercises warm up your muscles and joints and improve the movement and flexibility of your hip and pelvis. ?Quadriceps stretch, prone ? ?Lie on your abdomen (prone position) on a firm surface, such as a bed or padded floor. ?Bend your left / right knee and reach back to hold your ankle or pant leg. If you cannot reach your ankle or pant leg, loop a  belt around your foot and grab the belt instead. ?Gently pull your heel toward your buttocks. Your knee should not slide out to the side. You should feel a stretch in the front of your thigh and knee (quadriceps). ?Hold this position for __________ seconds. ?Repeat __________ times. Complete this exercise __________ times a day. ?Iliotibial band stretch ?An  iliotibial band is a strong band of muscle tissue that runs from the outer side of your hip to the outer side of your thigh and knee. ?Lie on your side with your left / right leg in the top position. ?Bend both of your knees and grab your left / right ankle. Stretch out your bottom arm to help you balance. ?Slowly bring your top knee back so your thigh goes behind your trunk. ?Slowly lower your top leg toward the floor until you feel a gentle stretch on the outside of your left / right hip and thigh. If you do not feel a stretch and your knee will not fall farther, place the heel of your other foot on top of your knee and pull your knee down toward the floor with your foot. ?Hold this position for __________ seconds. ?Repeat __________ times. Complete this exercise __________ times a day. ?Strengthening exercises ?These exercises build strength and endurance in your hip and pelvis. Endurance is the ability to use your muscles for a long time, even after they get tired. ?Straight leg raises, side-lying ?This exercise strengthens the muscles that rotate the leg at the hip and move it away from your body (hip abductors). ?Lie on your side with your left / right leg in the top position. Lie so your head, shoulder, hip, and knee line up. You may bend your bottom knee to help you balance. ?Roll your hips slightly forward so your hips are stacked directly over each other and your left / right knee is facing forward. ?Tense the muscles in your outer thigh and lift your top leg 4-6 inches (10-15 cm). ?Hold this position for __________ seconds. ?Slowly lower your leg to return to the starting position. Let your muscles relax completely before doing another repetition. ?Repeat __________ times. Complete this exercise __________ times a day. ?Leg raises, prone ?This exercise strengthens the muscles that move the hips backward (hip extensors). ?Lie on your abdomen (prone position) on your bed or a firm surface. You can put a  pillow under your hips if that is more comfortable for your lower back. ?Bend your left / right knee so your foot is straight up in the air. ?Squeeze your buttocks muscles and lift your left / right thigh off the bed. Do not let your back arch. ?Tense your thigh muscle as hard as you can without increasing any knee pain. ?Hold this position for __________ seconds. ?Slowly lower your leg to return to the starting position and allow it to relax completely. ?Repeat __________ times. Complete this exercise __________ times a day. ?Hip hike ?Stand sideways on a bottom step. Stand on your left / right leg with your other foot unsupported next to the step. You can hold on to a railing or wall for balance if needed. ?Keep your knees straight and your torso square. Then lift your left / right hip up toward the ceiling. ?Slowly let your left / right hip lower toward the floor, past the starting position. Your foot should get closer to the floor. Do not lean or bend your knees. ?Repeat __________ times. Complete this exercise __________ times a day. ?This  information is not intended to replace advice given to you by your health care provider. Make sure you discuss any questions you have with your health care provider. ?Document Revised: 01/23/2020 Document Reviewed: 01/23/2020 ?Elsevier Patient Education ? [place]. ? ?

## 2022-02-21 LAB — COMPLETE METABOLIC PANEL WITH GFR
AG Ratio: 2 (calc) (ref 1.0–2.5)
ALT: 11 U/L (ref 6–29)
AST: 20 U/L (ref 10–35)
Albumin: 4.2 g/dL (ref 3.6–5.1)
Alkaline phosphatase (APISO): 41 U/L (ref 37–153)
BUN: 17 mg/dL (ref 7–25)
CO2: 28 mmol/L (ref 20–32)
Calcium: 9 mg/dL (ref 8.6–10.4)
Chloride: 103 mmol/L (ref 98–110)
Creat: 0.88 mg/dL (ref 0.50–1.03)
Globulin: 2.1 g/dL (calc) (ref 1.9–3.7)
Glucose, Bld: 95 mg/dL (ref 65–99)
Potassium: 4 mmol/L (ref 3.5–5.3)
Sodium: 139 mmol/L (ref 135–146)
Total Bilirubin: 0.5 mg/dL (ref 0.2–1.2)
Total Protein: 6.3 g/dL (ref 6.1–8.1)
eGFR: 80 mL/min/{1.73_m2} (ref >=60)

## 2022-02-21 LAB — CBC WITH DIFFERENTIAL/PLATELET
Absolute Monocytes: 488 cells/uL (ref 200–950)
Basophils Absolute: 53 cells/uL (ref 0–200)
Basophils Relative: 0.8 %
Eosinophils Absolute: 79 cells/uL (ref 15–500)
Eosinophils Relative: 1.2 %
HCT: 41 % (ref 35.0–45.0)
Hemoglobin: 13.8 g/dL (ref 11.7–15.5)
Lymphs Abs: 1947 cells/uL (ref 850–3900)
MCH: 31.9 pg (ref 27.0–33.0)
MCHC: 33.7 g/dL (ref 32.0–36.0)
MCV: 94.7 fL (ref 80.0–100.0)
MPV: 10.9 fL (ref 7.5–12.5)
Monocytes Relative: 7.4 %
Neutro Abs: 4033 cells/uL (ref 1500–7800)
Neutrophils Relative %: 61.1 %
Platelets: 218 10*3/uL (ref 140–400)
RBC: 4.33 10*6/uL (ref 3.80–5.10)
RDW: 11.4 % (ref 11.0–15.0)
Total Lymphocyte: 29.5 %
WBC: 6.6 10*3/uL (ref 3.8–10.8)

## 2022-02-21 LAB — QUANTIFERON-TB GOLD PLUS
Mitogen-NIL: 5.05 IU/mL
NIL: 0.04 IU/mL
QuantiFERON-TB Gold Plus: NEGATIVE
TB1-NIL: <0 IU/mL
TB2-NIL: <0 IU/mL

## 2022-02-21 NOTE — Progress Notes (Signed)
TB Gold negative

## 2022-03-09 ENCOUNTER — Other Ambulatory Visit (HOSPITAL_COMMUNITY): Payer: Self-pay

## 2022-03-11 ENCOUNTER — Other Ambulatory Visit (HOSPITAL_COMMUNITY): Payer: Self-pay

## 2022-03-11 ENCOUNTER — Other Ambulatory Visit: Payer: Self-pay | Admitting: Physician Assistant

## 2022-03-11 DIAGNOSIS — L4050 Arthropathic psoriasis, unspecified: Secondary | ICD-10-CM

## 2022-03-11 MED ORDER — SIMPONI 50 MG/0.5ML ~~LOC~~ SOAJ
SUBCUTANEOUS | 2 refills | Status: DC
  Filled 2022-03-11: qty 0.5, 28d supply, fill #0
  Filled 2022-04-05: qty 0.5, 28d supply, fill #1
  Filled 2022-05-10: qty 0.5, 28d supply, fill #2

## 2022-03-11 NOTE — Telephone Encounter (Signed)
Next Visit: 05/26/2022 ? ?Last Visit: 02/17/2022 ? ?Last Fill: 12/04/2021 ? ?[id number] arthritis ? ?Current Dose per office note 02/17/2022: Simponi 50 mg subcutaneous injections every 28 days.  ? ?Labs: 02/17/2022 CBC and CMP WNL ? ?TB Gold: 02/17/2022 Neg  ? ?Okay to refill Simponi?  ?

## 2022-03-15 ENCOUNTER — Other Ambulatory Visit (HOSPITAL_COMMUNITY): Payer: Self-pay

## 2022-04-01 ENCOUNTER — Other Ambulatory Visit (HOSPITAL_COMMUNITY): Payer: Self-pay

## 2022-04-05 ENCOUNTER — Other Ambulatory Visit (HOSPITAL_COMMUNITY): Payer: Self-pay

## 2022-04-07 NOTE — Progress Notes (Signed)
51 y.o. [id number] Married Caucasian female here for annual exam. Would like to discuss IUD expiration--reports intermittent spotting within the last yr. Also would like to discuss colon cancer screening recommendations.    ? ?She wants to check her hormones.  ?Having monthly light spotting for a day.  ?She would like to continue with a Mirena IUD.  ? ?Dealing with vaginal dryness.  ? ?Has some night sweats.  ? ?PCP: Dorris Carnes, MD   ? ?No LMP recorded. (Menstrual status: IUD).     ?Mirena IUD placed 05/30/15. ?  ?    ?Sexually active: Yes.    ?The current method of family planning is IUD.    ?Exercising: Yes.     Daily walking, Running 2-3x a week.  ?Smoker:  no ? ?Health Maintenance: ?Pap: 02/05/20-WNL, HPV- neg, 07/09/2015-WNL, HPV- neg ?History of abnormal Pap:  no ?MMG: Overdue ?Colonoscopy: never had  ?BMD: never  Result never  ?TDaP: 01/28/21 ?Gardasil:   no ?HIV: 04/15/20- NR ?Hep C: 06/29/19-NR ?Screening Labs:  PCP ? ? reports that she has never smoked. She has been exposed to tobacco smoke. She has never used smokeless tobacco. She reports current alcohol use of about 3.0 - 4.0 standard drinks per week. She reports that she does not use drugs. ? ?Past Medical History:  ?Diagnosis Date  ? ADHD   ? Bile duct injury 2020  ? mild  ? Cholecystitis   ? Elevated LFTs   ? MGUS (monoclonal gammopathy of unknown significance) 2021  ? PONV (postoperative nausea and vomiting)   ? Psoriatic arthritis ([place])   ? Rib fracture   ? ? ?Past Surgical History:  ?Procedure Laterality Date  ? CHOLECYSTECTOMY N/A 08/13/2019  ? Procedure: LAPAROSCOPIC CHOLECYSTECTOMY;  Surgeon: Rolm Bookbinder, MD;  Location: [facility];  Service: General;  Laterality: N/A;  ? INTRAUTERINE DEVICE INSERTION  05/30/15, 05/06/10  ? MIrena IUD  ? TONSILLECTOMY AND ADENOIDECTOMY  1990  ? [place] EXTRACTION Bilateral 1991  ? ? ?Current Outpatient Medications  ?Medication Sig Dispense Refill  ? Cholecalciferol (VITAMIN D3) 125 MCG (5000 UT) CAPS Take 1 capsule by  mouth daily.    ? EVEKEO 10 MG TABS Take 10 mg by mouth as needed.  0  ? Golimumab (SIMPONI) 50 MG/0.5ML SOAJ INJECT 50 MG INTO THE SKIN EVERY 28 DAYS. 0.5 mL 2  ? levonorgestrel (MIRENA) 20 MCG/24HR IUD 1 each by Intrauterine route once. Mirena IUD.    ? methocarbamol (ROBAXIN) 500 MG tablet Take 1 tablet (500 mg total) by mouth daily as needed for muscle spasms. (Patient not taking: Reported on 04/14/2022) 14 tablet 0  ? ?No current facility-administered medications for this visit.  ? ? ?Family History  ?Problem Relation Age of Onset  ? Hyperlipidemia Mother   ? Ovarian cysts Mother   ?     benign  ? Endometriosis Mother   ? Liver disease Mother   ?     NASH  ? Other Father   ?     auto imune  ? Coronary artery disease Father   ?     cardio ablation  ? COPD Maternal Grandmother   ? Bone cancer Maternal Grandfather 32  ? Arthritis/Rheumatoid Sister 36  ? Endometriosis Sister   ? Retinitis pigmentosa Sister 90  ? Ulcerative colitis Sister 29  ?     surgical repair, J pouch  ? Ovarian cancer Sister 52  ?     negative genetic testing  ? Liver disease Maternal Aunt   ?  NASH  ? Colon cancer Neg Hx   ? Esophageal cancer Neg Hx   ? Pancreatic cancer Neg Hx   ? Stomach cancer Neg Hx   ? ? ?Review of Systems  ?All other systems reviewed and are negative. ? ?Exam:   ?BP 108/78   Pulse 76   Ht '5\' 8"'$  (1.727 m)   Wt 155 lb (70.3 kg)   SpO2 96%   BMI 23.57 kg/m?     ?General appearance: alert, cooperative and appears stated age ?Head: normocephalic, without obvious abnormality, atraumatic ?Neck: no adenopathy, supple, symmetrical, trachea midline and thyroid normal to inspection and palpation ?Lungs: clear to auscultation bilaterally ?Breasts: normal appearance, no masses or tenderness, No nipple retraction or dimpling, No nipple discharge or bleeding, No axillary adenopathy ?Heart: regular rate and rhythm ?Abdomen: soft, non-tender; no masses, no organomegaly ?Extremities: extremities normal, atraumatic, no cyanosis or  edema ?Skin: skin color, texture, turgor normal. No rashes or lesions ?Lymph nodes: cervical, supraclavicular, and axillary nodes normal. ?Neurologic: grossly normal ? ?Pelvic: External genitalia:  no lesions ?             No abnormal inguinal nodes palpated. ?             Urethra:  normal appearing urethra with no masses, tenderness or lesions ?             Bartholins and Skenes: normal    ?             Vagina: normal appearing vagina with normal color and discharge, no lesions ?             Cervix: no lesions.  IUD strings noted.  ?             Pap taken: no ?Bimanual Exam:  Uterus:  normal size, contour, position, consistency, mobility, non-tender ?             Adnexa: no mass, fullness, tenderness ?             Rectal exam: yes.  Confirms. ?             Anus:  normal sphincter tone, no lesions ? ?Chaperone was present for exam:  Lovena Le, CMA ? ?Assessment:   ?Well woman visit with gynecologic exam. ?Menopausal symptoms.  ?FH ovarian cancer in sister.  Sister had negative genetic testing.  ?Mirena IUD.  IUD expires 05/2023. ?Colon cancer screening.  ?MGUS.  ? ?Plan: ?Mammogram screening discussed.  She will schedule.  ?Self breast awareness reviewed. ?Pap and HR HPV as above. ?Guidelines for Calcium, Vitamin D, regular exercise program including cardiovascular and weight bearing exercise. ?Will consider Vagifem after mammogram is back and normal.  I discussed potential effect on breast cancer.  ?Will check FSH and estradiol. ?Pelvic US. ?Cologuard ordered. ?Follow up annually and prn.  ? ?After visit summary provided.  ? ? ? ?

## 2022-04-12 ENCOUNTER — Other Ambulatory Visit (HOSPITAL_COMMUNITY): Payer: Self-pay

## 2022-04-14 ENCOUNTER — Encounter: Payer: Self-pay | Admitting: Obstetrics and Gynecology

## 2022-04-14 ENCOUNTER — Ambulatory Visit (INDEPENDENT_AMBULATORY_CARE_PROVIDER_SITE_OTHER): Payer: BC Managed Care – PPO | Admitting: Obstetrics and Gynecology

## 2022-04-14 VITALS — BP 108/78 | HR 76 | Ht 68.0 in | Wt 155.0 lb

## 2022-04-14 DIAGNOSIS — Z01419 Encounter for gynecological examination (general) (routine) without abnormal findings: Secondary | ICD-10-CM

## 2022-04-14 DIAGNOSIS — N926 Irregular menstruation, unspecified: Secondary | ICD-10-CM | POA: Diagnosis not present

## 2022-04-14 DIAGNOSIS — Z1211 Encounter for screening for malignant neoplasm of colon: Secondary | ICD-10-CM

## 2022-04-14 DIAGNOSIS — R945 Abnormal results of liver function studies: Secondary | ICD-10-CM | POA: Diagnosis not present

## 2022-04-14 DIAGNOSIS — Z8041 Family history of malignant neoplasm of ovary: Secondary | ICD-10-CM

## 2022-04-14 DIAGNOSIS — N951 Menopausal and female climacteric states: Secondary | ICD-10-CM

## 2022-04-14 DIAGNOSIS — D472 Monoclonal gammopathy: Secondary | ICD-10-CM | POA: Diagnosis not present

## 2022-04-14 NOTE — Patient Instructions (Signed)

## 2022-04-15 LAB — FOLLICLE STIMULATING HORMONE: FSH: 4.3 m[IU]/mL

## 2022-04-15 LAB — ESTRADIOL: Estradiol: 90 pg/mL

## 2022-05-05 ENCOUNTER — Other Ambulatory Visit (HOSPITAL_COMMUNITY): Payer: Self-pay

## 2022-05-06 DIAGNOSIS — Z1211 Encounter for screening for malignant neoplasm of colon: Secondary | ICD-10-CM | POA: Diagnosis not present

## 2022-05-10 ENCOUNTER — Other Ambulatory Visit (HOSPITAL_COMMUNITY): Payer: Self-pay

## 2022-05-13 ENCOUNTER — Other Ambulatory Visit (HOSPITAL_COMMUNITY): Payer: Self-pay

## 2022-05-13 NOTE — Progress Notes (Deleted)
Office Visit Note  Patient: Samantha Graves             Date of Birth: [birth year]           MRN: [id number]             PCP: Fay Records, MD Referring: Fay Records, MD Visit Date: 05/26/2022 Occupation: '@GUAROCC'$ @  Subjective:  No chief complaint on file.   History of Present Illness: Kamaiyah Uselton Duce is a 51 y.o. female ***   Activities of Daily Living:  Patient reports morning stiffness for *** {minute/hour:19697}.   Patient {ACTIONS;DENIES/REPORTS:21021675::"Denies"} nocturnal pain.  Difficulty dressing/grooming: {ACTIONS;DENIES/REPORTS:21021675::"Denies"} Difficulty climbing stairs: {ACTIONS;DENIES/REPORTS:21021675::"Denies"} Difficulty getting out of chair: {ACTIONS;DENIES/REPORTS:21021675::"Denies"} Difficulty using hands for taps, buttons, cutlery, and/or writing: {ACTIONS;DENIES/REPORTS:21021675::"Denies"}  No Rheumatology ROS completed.   PMFS History:  Patient Active Problem List   Diagnosis Date Noted   Psoriasis 01/11/2017   High risk medication use 01/11/2017   PSORIATIC ARTHRITIS 10/14/2009   ALLERGIC RHINITIS 09/06/2007    Past Medical History:  Diagnosis Date   ADHD    Bile duct injury 2020   mild   Cholecystitis    Elevated LFTs    MGUS (monoclonal gammopathy of unknown significance) 2021   PONV (postoperative nausea and vomiting)    Psoriatic arthritis ([place])    Rib fracture     Family History  Problem Relation Age of Onset   Hyperlipidemia Mother    Ovarian cysts Mother        benign   Endometriosis Mother    Liver disease Mother        NASH   Other Father        auto imune   Coronary artery disease Father        cardio ablation   COPD Maternal Grandmother    Bone cancer Maternal Grandfather 36   Arthritis/Rheumatoid Sister 39   Endometriosis Sister    Retinitis pigmentosa Sister 51   Ulcerative colitis Sister 21       surgical repair, J pouch   Ovarian cancer Sister 61       negative genetic testing   Liver disease  Maternal Aunt        NASH   Colon cancer Neg Hx    Esophageal cancer Neg Hx    Pancreatic cancer Neg Hx    Stomach cancer Neg Hx    Past Surgical History:  Procedure Laterality Date   CHOLECYSTECTOMY N/A 08/13/2019   Procedure: LAPAROSCOPIC CHOLECYSTECTOMY;  Surgeon: Rolm Bookbinder, MD;  Location: [facility];  Service: General;  Laterality: N/A;   INTRAUTERINE DEVICE INSERTION  05/30/15, 05/06/10   MIrena IUD   TONSILLECTOMY AND ADENOIDECTOMY  1990   WISDOM TOOTH EXTRACTION Bilateral 1991   Social History   Social History Narrative   Not on file   Immunization History  Administered Date(s) Administered   Influenza Whole 08/30/2003   Influenza,inj,Quad PF,6+ Mos 09/01/2015, 09/14/2019, 11/14/2020   PFIZER(Purple Top)SARS-COV-2 Vaccination 02/09/2020, 03/03/2020, 09/02/2020   Tdap 02/25/2011, 02/17/2021     Objective: Vital Signs: There were no vitals taken for this visit.   Physical Exam   Musculoskeletal Exam: ***  CDAI Exam: CDAI Score: -- Patient Global: --; Provider Global: -- Swollen: --; Tender: -- Joint Exam 05/26/2022   No joint exam has been documented for this visit   There is currently no information documented on the homunculus. Go to the Rheumatology activity and complete the homunculus joint exam.  Investigation: No additional findings.  Imaging: No  results found.  Recent Labs: Lab Results  Component Value Date   WBC 6.6 02/17/2022   HGB 13.8 02/17/2022   PLT 218 02/17/2022   NA 139 02/17/2022   K 4.0 02/17/2022   CL 103 02/17/2022   CO2 28 02/17/2022   GLUCOSE 95 02/17/2022   BUN 17 02/17/2022   CREATININE 0.88 02/17/2022   BILITOT 0.5 02/17/2022   ALKPHOS 41 03/17/2021   AST 20 02/17/2022   ALT 11 02/17/2022   PROT 6.3 02/17/2022   ALBUMIN 4.2 03/17/2021   CALCIUM 9.0 02/17/2022   GFRAA 107 03/16/2021   QFTBGOLD NEGATIVE 11/04/2017   QFTBGOLDPLUS NEGATIVE 02/17/2022    Speciality Comments: Enbrel, Humira-inadequate  response Simponi-04/22 Leflunomide-patient did not to start as she was concerned about the side effects of elevated LFTs.  Procedures:  No procedures performed Allergies: Tamiflu [oseltamivir phosphate]   Assessment / Plan:     Visit Diagnoses: Psoriatic arthritis ([place])  Psoriasis  High risk medication use  Chronic SI joint pain  Trochanteric bursitis of both hips  Plantar fasciitis  Elevated LFTs  Monoclonal gammopathy  Orders: No orders of the defined types were placed in this encounter.  No orders of the defined types were placed in this encounter.   Face-to-face time spent with patient was *** minutes. Greater than 50% of time was spent in counseling and coordination of care.  Follow-Up Instructions: No follow-ups on file.   Ofilia Neas, PA-C  Note - This record has been created using Dragon software.  Chart creation errors have been sought, but may not always  have been located. Such creation errors do not reflect on  the standard of medical care.

## 2022-05-17 LAB — COLOGUARD: COLOGUARD: NEGATIVE

## 2022-05-25 ENCOUNTER — Other Ambulatory Visit: Payer: BC Managed Care – PPO

## 2022-05-26 ENCOUNTER — Ambulatory Visit: Payer: BC Managed Care – PPO | Admitting: Physician Assistant

## 2022-05-26 DIAGNOSIS — M7061 Trochanteric bursitis, right hip: Secondary | ICD-10-CM

## 2022-05-26 DIAGNOSIS — M722 Plantar fascial fibromatosis: Secondary | ICD-10-CM

## 2022-05-26 DIAGNOSIS — L4050 Arthropathic psoriasis, unspecified: Secondary | ICD-10-CM

## 2022-05-26 DIAGNOSIS — L409 Psoriasis, unspecified: Secondary | ICD-10-CM

## 2022-05-26 DIAGNOSIS — R7989 Other specified abnormal findings of blood chemistry: Secondary | ICD-10-CM

## 2022-05-26 DIAGNOSIS — Z79899 Other long term (current) drug therapy: Secondary | ICD-10-CM

## 2022-05-26 DIAGNOSIS — G8929 Other chronic pain: Secondary | ICD-10-CM

## 2022-05-26 DIAGNOSIS — D472 Monoclonal gammopathy: Secondary | ICD-10-CM

## 2022-05-28 ENCOUNTER — Other Ambulatory Visit (HOSPITAL_COMMUNITY): Payer: Self-pay

## 2022-06-03 NOTE — Progress Notes (Deleted)
Office Visit Note  Patient: Samantha Graves             Date of Birth: 1971-04-03           MRN: [id number]             PCP: Fay Records, MD Referring: Fay Records, MD Visit Date: 06/16/2022 Occupation: '@GUAROCC'$ @  Subjective:     History of Present Illness: Samantha Graves is a 51 y.o. female with history of psoriatic arthritis. She is on Simponi 50 mg subcutaneous injections every 28 days.   CBC and CMP were drawn on 02/17/2022.  Orders for CBC and CMP were released today.  Her next lab work will be due in October and every 3 months to monitor for drug toxicity. TB Gold negative on 02/17/2022. Discussed the importance of holding simponi if she develops signs or symptoms of an infection and to resume once the infection has completely cleared.  Activities of Daily Living:  Patient reports morning stiffness for *** {minute/hour:19697}.   Patient {ACTIONS;DENIES/REPORTS:21021675::"Denies"} nocturnal pain.  Difficulty dressing/grooming: {ACTIONS;DENIES/REPORTS:21021675::"Denies"} Difficulty climbing stairs: {ACTIONS;DENIES/REPORTS:21021675::"Denies"} Difficulty getting out of chair: {ACTIONS;DENIES/REPORTS:21021675::"Denies"} Difficulty using hands for taps, buttons, cutlery, and/or writing: {ACTIONS;DENIES/REPORTS:21021675::"Denies"}  No Rheumatology ROS completed.   PMFS History:  Patient Active Problem List   Diagnosis Date Noted   Psoriasis 01/11/2017   High risk medication use 01/11/2017   PSORIATIC ARTHRITIS 10/14/2009   ALLERGIC RHINITIS 09/06/2007    Past Medical History:  Diagnosis Date   ADHD    Bile duct injury 2020   mild   Cholecystitis    Elevated LFTs    MGUS (monoclonal gammopathy of unknown significance) 2021   PONV (postoperative nausea and vomiting)    Psoriatic arthritis ([place])    Rib fracture     Family History  Problem Relation Age of Onset   Hyperlipidemia Mother    Ovarian cysts Mother        benign   Endometriosis Mother     Liver disease Mother        NASH   Other Father        auto imune   Coronary artery disease Father        cardio ablation   COPD Maternal Grandmother    Bone cancer Maternal Grandfather 84   Arthritis/Rheumatoid Sister 61   Endometriosis Sister    Retinitis pigmentosa Sister 79   Ulcerative colitis Sister 21       surgical repair, J pouch   Ovarian cancer Sister 61       negative genetic testing   Liver disease Maternal Aunt        NASH   Colon cancer Neg Hx    Esophageal cancer Neg Hx    Pancreatic cancer Neg Hx    Stomach cancer Neg Hx    Past Surgical History:  Procedure Laterality Date   CHOLECYSTECTOMY N/A 08/13/2019   Procedure: LAPAROSCOPIC CHOLECYSTECTOMY;  Surgeon: Rolm Bookbinder, MD;  Location: [facility];  Service: General;  Laterality: N/A;   INTRAUTERINE DEVICE INSERTION  05/30/15, 05/06/10   MIrena IUD   TONSILLECTOMY AND ADENOIDECTOMY  1990   WISDOM TOOTH EXTRACTION Bilateral 1991   Social History   Social History Narrative   Not on file   Immunization History  Administered Date(s) Administered   Influenza Whole 08/30/2003   Influenza,inj,Quad PF,6+ Mos 09/01/2015, 09/14/2019, 11/14/2020   PFIZER(Purple Top)SARS-COV-2 Vaccination 02/09/2020, 03/03/2020, 09/02/2020   Tdap 02/25/2011, 02/17/2021     Objective: Vital Signs: There  were no vitals taken for this visit.   Physical Exam Vitals and nursing note reviewed.  Constitutional:      Appearance: She is well-developed.  HENT:     Head: Normocephalic and atraumatic.  Eyes:     Conjunctiva/sclera: Conjunctivae normal.  Cardiovascular:     Rate and Rhythm: Normal rate and regular rhythm.     Heart sounds: Normal heart sounds.  Pulmonary:     Effort: Pulmonary effort is normal.     Breath sounds: Normal breath sounds.  Abdominal:     General: Bowel sounds are normal.     Palpations: Abdomen is soft.  Musculoskeletal:     Cervical back: Normal range of motion.  Skin:    General: Skin is warm and  dry.     Capillary Refill: Capillary refill takes less than 2 seconds.  Neurological:     Mental Status: She is alert and oriented to person, place, and time.  Psychiatric:        Behavior: Behavior normal.      Musculoskeletal Exam: ***  CDAI Exam: CDAI Score: -- Patient Global: --; Provider Global: -- Swollen: --; Tender: -- Joint Exam 06/16/2022   No joint exam has been documented for this visit   There is currently no information documented on the homunculus. Go to the Rheumatology activity and complete the homunculus joint exam.  Investigation: No additional findings.  Imaging: No results found.  Recent Labs: Lab Results  Component Value Date   WBC 6.6 02/17/2022   HGB 13.8 02/17/2022   PLT 218 02/17/2022   NA 139 02/17/2022   K 4.0 02/17/2022   CL 103 02/17/2022   CO2 28 02/17/2022   GLUCOSE 95 02/17/2022   BUN 17 02/17/2022   CREATININE 0.88 02/17/2022   BILITOT 0.5 02/17/2022   ALKPHOS 41 03/17/2021   AST 20 02/17/2022   ALT 11 02/17/2022   PROT 6.3 02/17/2022   ALBUMIN 4.2 03/17/2021   CALCIUM 9.0 02/17/2022   GFRAA 107 03/16/2021   QFTBGOLD NEGATIVE 11/04/2017   QFTBGOLDPLUS NEGATIVE 02/17/2022    Speciality Comments: Enbrel, Humira-inadequate response Simponi-04/22 Leflunomide-patient did not to start as she was concerned about the side effects of elevated LFTs.  Procedures:  No procedures performed Allergies: Tamiflu [oseltamivir phosphate]   Assessment / Plan:     Visit Diagnoses: Psoriatic arthritis ([place])  Psoriasis  High risk medication use  Chronic SI joint pain  Trochanteric bursitis of both hips  Plantar fasciitis  Elevated LFTs  Monoclonal gammopathy  Orders: No orders of the defined types were placed in this encounter.  No orders of the defined types were placed in this encounter.   Face-to-face time spent with patient was *** minutes. Greater than 50% of time was spent in counseling and coordination of  care.  Follow-Up Instructions: No follow-ups on file.   Ofilia Neas, PA-C  Note - This record has been created using Dragon software.  Chart creation errors have been sought, but may not always  have been located. Such creation errors do not reflect on  the standard of medical care.

## 2022-06-07 ENCOUNTER — Other Ambulatory Visit (HOSPITAL_COMMUNITY): Payer: Self-pay

## 2022-06-16 ENCOUNTER — Ambulatory Visit: Payer: BC Managed Care – PPO | Admitting: Physician Assistant

## 2022-06-16 DIAGNOSIS — M7061 Trochanteric bursitis, right hip: Secondary | ICD-10-CM

## 2022-06-16 DIAGNOSIS — Z79899 Other long term (current) drug therapy: Secondary | ICD-10-CM

## 2022-06-16 DIAGNOSIS — G8929 Other chronic pain: Secondary | ICD-10-CM

## 2022-06-16 DIAGNOSIS — M722 Plantar fascial fibromatosis: Secondary | ICD-10-CM

## 2022-06-16 DIAGNOSIS — L4050 Arthropathic psoriasis, unspecified: Secondary | ICD-10-CM

## 2022-06-16 DIAGNOSIS — R7989 Other specified abnormal findings of blood chemistry: Secondary | ICD-10-CM

## 2022-06-16 DIAGNOSIS — D472 Monoclonal gammopathy: Secondary | ICD-10-CM

## 2022-06-16 DIAGNOSIS — L409 Psoriasis, unspecified: Secondary | ICD-10-CM

## 2022-06-16 NOTE — Progress Notes (Unsigned)
Office Visit Note  Patient: Samantha Graves             Date of Birth: 01-28-1971           MRN: [id number]             PCP: Fay Records, MD Referring: Fay Records, MD Visit Date: 06/24/2022 Occupation: '@GUAROCC'$ @  Subjective:  Left sided low back pain    History of Present Illness: Samantha Graves is a 51 y.o. female with history of psoriatic arthritis.  She remains on simponi 50 mg sq injections every month.  She continues to tolerate Simponi without any side effects or injection site reactions.  She has not had any signs or symptoms of a sort of arthritis flare.  She experiences small patches of psoriasis on both elbows and on her scalp leading up to her Simponi injections.  She is due for an injection currently.  She states that she has been crocheting more frequently and occasionally has pain in the left second MCP joint but denies any tenderness or swelling at this time.  She denies any knee joint pain or swelling despite starting to run 3 days a week again.  She has been having ongoing pain in her lower back on the left side which radiates into the groin at times.  She states that the left trochanteric bursa injection in March 2023 provided no relief.  She is concerned her discomfort is more so from her back not her hip.  She plans on following up with Dr. Marlou Sa for further evaluation.  She states that methocarbamol has been alleviating her symptoms when they are severe.  She denies any eye pain or sensitivity to light. She denies any recent infections.  She denies any new medical conditions.    Activities of Daily Living:  Patient reports morning stiffness for 1 hour.   Patient Reports nocturnal pain.  Difficulty dressing/grooming: Denies Difficulty climbing stairs: Denies Difficulty getting out of chair: Reports Difficulty using hands for taps, buttons, cutlery, and/or writing: Denies  Review of Systems  Constitutional:  Negative for fatigue.  HENT:  Negative for  mouth dryness.   Eyes:  Negative for dryness.  Respiratory:  Negative for shortness of breath.   Cardiovascular:  Negative for swelling in legs/feet.  Gastrointestinal:  Negative for constipation.  Endocrine: Negative for excessive thirst.  Genitourinary:  Negative for difficulty urinating.  Musculoskeletal:  Positive for joint pain, joint pain, joint swelling, muscle weakness, morning stiffness and muscle tenderness.  Skin:  Negative for rash.  Allergic/Immunologic: Negative for susceptible to infections.  Neurological:  Negative for numbness.  Hematological:  Negative for bruising/bleeding tendency.  Psychiatric/Behavioral:  Positive for sleep disturbance.     PMFS History:  Patient Active Problem List   Diagnosis Date Noted   Psoriasis 01/11/2017   High risk medication use 01/11/2017   PSORIATIC ARTHRITIS 10/14/2009   ALLERGIC RHINITIS 09/06/2007    Past Medical History:  Diagnosis Date   ADHD    Bile duct injury 2020   mild   Cholecystitis    Elevated LFTs    MGUS (monoclonal gammopathy of unknown significance) 2021   PONV (postoperative nausea and vomiting)    Psoriatic arthritis (HCC)    Rib fracture     Family History  Problem Relation Age of Onset   Hyperlipidemia Mother    Ovarian cysts Mother        benign   Endometriosis Mother    Liver disease Mother  NASH   Other Father        auto imune   Coronary artery disease Father        cardio ablation   COPD Maternal Grandmother    Bone cancer Maternal Grandfather 75   Arthritis/Rheumatoid Sister 48   Endometriosis Sister    Retinitis pigmentosa Sister 10   Ulcerative colitis Sister 21       surgical repair, J pouch   Ovarian cancer Sister 46       negative genetic testing   Liver disease Maternal Aunt        NASH   Colon cancer Neg Hx    Esophageal cancer Neg Hx    Pancreatic cancer Neg Hx    Stomach cancer Neg Hx    Past Surgical History:  Procedure Laterality Date   CHOLECYSTECTOMY N/A  08/13/2019   Procedure: LAPAROSCOPIC CHOLECYSTECTOMY;  Surgeon: Rolm Bookbinder, MD;  Location: [facility];  Service: General;  Laterality: N/A;   INTRAUTERINE DEVICE INSERTION  05/30/15, 05/06/10   MIrena IUD   TONSILLECTOMY AND [place] EXTRACTION Bilateral 1991   Social History   Social History Narrative   Not on file   Immunization History  Administered Date(s) Administered   Influenza Whole 08/30/2003   Influenza,inj,Quad PF,6+ Mos 09/01/2015, 09/14/2019, 11/14/2020   PFIZER(Purple Top)SARS-COV-2 Vaccination 02/09/2020, 03/03/2020, 09/02/2020   Tdap 02/25/2011, 02/17/2021     Objective: Vital Signs: BP 126/80 (BP Location: Left Arm, Patient Position: Sitting, Cuff Size: Normal)   Pulse 64   Resp 15   Ht 5' 7.5" (1.715 m)   Wt 155 lb (70.3 kg)   BMI 23.92 kg/m    Physical Exam Vitals and nursing note reviewed.  Constitutional:      Appearance: She is well-developed.  HENT:     Head: Normocephalic and atraumatic.  Eyes:     Conjunctiva/sclera: Conjunctivae normal.  Cardiovascular:     Rate and Rhythm: Normal rate and regular rhythm.     Heart sounds: Normal heart sounds.  Pulmonary:     Effort: Pulmonary effort is normal.     Breath sounds: Normal breath sounds.  Abdominal:     General: Bowel sounds are normal.     Palpations: Abdomen is soft.  Musculoskeletal:     Cervical back: Normal range of motion.  Skin:    General: Skin is warm and dry.     Capillary Refill: Capillary refill takes less than 2 seconds.  Neurological:     Mental Status: She is alert and oriented to person, place, and time.  Psychiatric:        Behavior: Behavior normal.      Musculoskeletal Exam: C-spine has good ROM with no discomfort.  Shoulder joints, elbow joints, wrist joints, MCPs, PIPs, and DIPs good ROM with no synovitis. Complete fist formation bilaterally. Hip joints have good ROM with no groin pain.  No tenderness over trochanteric bursa at this time.  Tenderness over left piriformis muscle.  Knee joints have good ROM with no warmth or effusion.  Ankle joints have good ROM with no tenderness or joint swelling.   CDAI Exam: CDAI Score: -- Patient Global: --; Provider Global: -- Swollen: --; Tender: -- Joint Exam 06/24/2022   No joint exam has been documented for this visit   There is currently no information documented on the homunculus. Go to the Rheumatology activity and complete the homunculus joint exam.  Investigation: No additional findings.  Imaging: No results found.  Recent  Labs: Lab Results  Component Value Date   WBC 6.6 02/17/2022   HGB 13.8 02/17/2022   PLT 218 02/17/2022   NA 139 02/17/2022   K 4.0 02/17/2022   CL 103 02/17/2022   CO2 28 02/17/2022   GLUCOSE 95 02/17/2022   BUN 17 02/17/2022   CREATININE 0.88 02/17/2022   BILITOT 0.5 02/17/2022   ALKPHOS 41 03/17/2021   AST 20 02/17/2022   ALT 11 02/17/2022   PROT 6.3 02/17/2022   ALBUMIN 4.2 03/17/2021   CALCIUM 9.0 02/17/2022   GFRAA 107 03/16/2021   QFTBGOLD NEGATIVE 11/04/2017   QFTBGOLDPLUS NEGATIVE 02/17/2022    Speciality Comments: Enbrel, Humira-inadequate response Simponi-04/22 Leflunomide-patient did not to start as she was concerned about the side effects of elevated LFTs.  Procedures:  No procedures performed Allergies: Tamiflu [oseltamivir phosphate]     Assessment / Plan:     Visit Diagnoses: Psoriatic arthritis ([place]): She has no synovitis or dactylitis on examination.  She has not had any signs or symptoms of a psoriatic arthritis flare.  She has clinically been doing well on Simponi 50 mg sq injections every 28 days.  She is tolerating Simponi without any side effects or injection site reactions.  She has not missed any doses of Simponi recently and is due for her next dose today.  She noticed this a few small patches of psoriasis on her scalp and on the extensor surface of both elbows several days prior to when she is due for  Simponi.  She was advised to notify us if she would like a prescription for clobetasol cream to be sent to the pharmacy in the future. She has no evidence of Achilles tendinitis or plantar fasciitis.  She has no SI joint tenderness upon palpation.  No signs or symptoms of uveitis.  She will remain on Simponi as prescribed.  She was advised to notify us if she develops increased joint pain or joint swelling.  She will follow-up in the office in 5 months or sooner if needed.  Psoriasis: A few small patches of psoriasis noted on extensor surface of both elbows.  High risk medication use - Simponi 50 mg subcutaneous injections every 28 days.  Patient declined leflunomide in the past. - Plan: CBC with Differential/Platelet, COMPLETE METABOLIC PANEL WITH GFR CBC and CMP updated on 02/17/22.  Orders for CBC and CMP were released today.  Her next lab work will be due in October and every 3 months to monitor for drug toxicity.  Standing orders for CBC and CMP remain in place. TB gold negative on 02/17/22. She has not had any recent or recurrent infections.  Discussed the importance of holding Simponi if she develops signs or symptoms of infection and to resume once the infection has completely cleared.  Chronic SI joint pain: She has no SI joint tenderness upon palpation today.    Trochanteric bursitis of both hips: Resolved.   Chronic left-sided low back pain with left-sided sciatica: She presents today with ongoing pain on her left hip.  Her discomfort seems to be coming from her lower back.  She has been experiencing left-sided sciatica intermittently.  She has started to run 3 days a week and has noticed some instability in the left knee as well as some neuralgias.  On examination today she has no tenderness palpation over the left trochanteric bursa.  She is good range of motion of the left hip joint with no groin pain.  No midline spinal tenderness was noted.  She has some piriformis tenderness on exam.  No  SI joint tenderness upon palpation.  Her plan is for her to follow up with Dr. Marlou Sa for further evaluation and management.  Plantar fasciitis - Resolved.  Elevated LFTs: WNL on 02/17/22.   Monoclonal gammopathy - She is followed by Dr. Lorenso Courier.  Orders: Orders Placed This Encounter  Procedures   CBC with Differential/Platelet   COMPLETE METABOLIC PANEL WITH GFR   Meds ordered this encounter  Medications   methocarbamol (ROBAXIN) 500 MG tablet    Sig: Take 1 tablet (500 mg total) by mouth daily as needed for muscle spasms.    Dispense:  14 tablet    Refill:  0     Follow-Up Instructions: Return in about 5 months (around 11/24/2022) for Psoriatic arthritis.   Ofilia Neas, PA-C  Note - This record has been created using Dragon software.  Chart creation errors have been sought, but may not always  have been located. Such creation errors do not reflect on  the standard of medical care.

## 2022-06-21 NOTE — Progress Notes (Unsigned)
GYNECOLOGY  VISIT   HPI: 51 y.o.   Married  Caucasian  female   [phone] with No LMP recorded. (Menstrual status: IUD).   here for pelvic ultrasound.    Regular spotting every month for about the last year.   Has FH ovarian cancer in a sister.  Normal CA125 per patient.  Sister did have negative genetic testing.  Patient would like to start vaginal estrogen for dryness.  She will update her mammogram.   GYNECOLOGIC HISTORY: No LMP recorded. (Menstrual status: IUD). Contraception:  Mirena IUD 05-30-15 Menopausal hormone therapy:  n/a Last mammogram:  09-26-20 Lt.Br.US//decrease in size of lymph nodes;screening 01/2021.  02-26-20 diag.w/Lt.Br.US.//glandular tissue LT.Br/Neg/BiRads3-0-HAS APPT NEXT WEEK Last pap smear: 02-05-20 Neg:Neg HR HPV, 07-09-15 Neg:Neg HR HPV        OB History     Gravida  4   Para  4   Term  4   Preterm  0   AB  0   Living  4      SAB  0   IAB  0   Ectopic  0   Multiple  0   Live Births  4              Patient Active Problem List   Diagnosis Date Noted   Psoriasis 01/11/2017   High risk medication use 01/11/2017   PSORIATIC ARTHRITIS 10/14/2009   ALLERGIC RHINITIS 09/06/2007    Past Medical History:  Diagnosis Date   ADHD    Bile duct injury 2020   mild   Cholecystitis    Elevated LFTs    MGUS (monoclonal gammopathy of unknown significance) 2021   PONV (postoperative nausea and vomiting)    Psoriatic arthritis ([place])    Rib fracture     Past Surgical History:  Procedure Laterality Date   CHOLECYSTECTOMY N/A 08/13/2019   Procedure: LAPAROSCOPIC CHOLECYSTECTOMY;  Surgeon: Rolm Bookbinder, MD;  Location: [facility];  Service: General;  Laterality: N/A;   INTRAUTERINE DEVICE INSERTION  05/30/15, 05/06/10   MIrena IUD   TONSILLECTOMY AND ADENOIDECTOMY  1990   WISDOM TOOTH EXTRACTION Bilateral 1991    Current Outpatient Medications  Medication Sig Dispense Refill   amphetamine-dextroamphetamine (ADDERALL) 10 MG tablet 1 TABLET BY  MOUTH ONCE A DAY AS NEEDED DEPENDING UPON THE DEMANDS OF THE AFTERNOON     Cholecalciferol (VITAMIN D3) 125 MCG (5000 UT) CAPS Take 1 capsule by mouth daily.     EVEKEO 10 MG TABS Take 10 mg by mouth as needed.  0   Golimumab (SIMPONI) 50 MG/0.5ML SOAJ INJECT 50 MG INTO THE SKIN EVERY 28 DAYS. 0.5 mL 2   levonorgestrel (MIRENA) 20 MCG/24HR IUD 1 each by Intrauterine route once. Mirena IUD.     methocarbamol (ROBAXIN) 500 MG tablet Take 1 tablet (500 mg total) by mouth daily as needed for muscle spasms. 14 tablet 0   No current facility-administered medications for this visit.     ALLERGIES: Tamiflu [oseltamivir phosphate]  Family History  Problem Relation Age of Onset   Hyperlipidemia Mother    Ovarian cysts Mother        benign   Endometriosis Mother    Liver disease Mother        NASH   Other Father        auto imune   Coronary artery disease Father        cardio ablation   COPD Maternal Grandmother    Bone cancer Maternal Grandfather 52   Arthritis/Rheumatoid Sister  75   Endometriosis Sister    Retinitis pigmentosa Sister 49   Ulcerative colitis Sister 52       surgical repair, J pouch   Ovarian cancer Sister 94       negative genetic testing   Liver disease Maternal Aunt        NASH   Colon cancer Neg Hx    Esophageal cancer Neg Hx    Pancreatic cancer Neg Hx    Stomach cancer Neg Hx     Social History   Socioeconomic History   Marital status: Married    Spouse name: Not on file   Number of children: Not on file   Years of education: Not on file   Highest education level: Not on file  Occupational History   Not on file  Tobacco Use   Smoking status: Never    Passive exposure: Past   Smokeless tobacco: Never  Vaping Use   Vaping Use: Never used  Substance and Sexual Activity   Alcohol use: Yes    Alcohol/week: 3.0 - 4.0 standard drinks of alcohol    Types: 3 - 4 Glasses of wine per week    Comment: occ   Drug use: No   Sexual activity: Yes    Birth  control/protection: I.U.D.    Comment: Mirena 05-30-15  Other Topics Concern   Not on file  Social History Narrative   Not on file   Social Determinants of Health   Financial Resource Strain: Not on file  Food Insecurity: Not on file  Transportation Needs: Not on file  Physical Activity: Not on file  Stress: Not on file  Social Connections: Not on file  Intimate Partner Violence: Not on file    Review of Systems  All other systems reviewed and are negative.   PHYSICAL EXAMINATION:    BP 104/64   Ht 5' 7.5" (1.715 m)   Wt 155 lb (70.3 kg)   BMI 23.92 kg/m     General appearance: alert, cooperative and appears stated age   Pelvic US Uterus 8.61 x 5.18 x 4.28 cm.  No fibroids.  EMS 2.8 mm.  No masses.    IUD in normal position. Ovaries normal size and normal follicle pattern.  No adnexal masses.  No free fluid.  ASSESSMENT  FH ovarian cancer.  IUD in normal position.  Vaginal atrophy.   PLAN  Normal ultrasound images and report reviewed.  She declines CA125.  We discussed false positives and negatives with CA125.  After mammogram back and normal, she would like to start local vaginal estrogen therapy.  Signs and symptoms of ovarian cancer reviewed.  We discussed Vagifem, vaginal estrogen cream and Estring.   She would like to use Vagifem.  I instructed her is use:  place in vagina nightly for 2 weeks and then two times per week.     An After Visit Summary was printed and given to the patient.  21 min  total time was spent for this patient encounter, including preparation, face-to-face counseling with the patient, coordination of care, and documentation of the encounter.

## 2022-06-22 ENCOUNTER — Ambulatory Visit (INDEPENDENT_AMBULATORY_CARE_PROVIDER_SITE_OTHER): Payer: BC Managed Care – PPO | Admitting: Obstetrics and Gynecology

## 2022-06-22 ENCOUNTER — Encounter: Payer: Self-pay | Admitting: Obstetrics and Gynecology

## 2022-06-22 ENCOUNTER — Other Ambulatory Visit (HOSPITAL_COMMUNITY): Payer: Self-pay

## 2022-06-22 ENCOUNTER — Other Ambulatory Visit: Payer: BC Managed Care – PPO | Admitting: Obstetrics and Gynecology

## 2022-06-22 ENCOUNTER — Ambulatory Visit (INDEPENDENT_AMBULATORY_CARE_PROVIDER_SITE_OTHER): Payer: BC Managed Care – PPO

## 2022-06-22 ENCOUNTER — Other Ambulatory Visit: Payer: Self-pay | Admitting: Physician Assistant

## 2022-06-22 VITALS — BP 104/64 | Ht 67.5 in | Wt 155.0 lb

## 2022-06-22 DIAGNOSIS — Z8041 Family history of malignant neoplasm of ovary: Secondary | ICD-10-CM | POA: Diagnosis not present

## 2022-06-22 DIAGNOSIS — L4050 Arthropathic psoriasis, unspecified: Secondary | ICD-10-CM

## 2022-06-22 DIAGNOSIS — N952 Postmenopausal atrophic vaginitis: Secondary | ICD-10-CM

## 2022-06-22 NOTE — Patient Instructions (Signed)
Estradiol Vaginal Insert What is this medication? ESTRADIOL (es tra DYE ole) reduces vaginal pain during sex due to menopause. It is an estrogen hormone. This medicine may be used for other purposes; ask your health care provider or pharmacist if you have questions. COMMON BRAND NAME(S): Imvexxy, Vagifem, Yuvafem What should I tell my care team before I take this medication? They need to know if you have any of these conditions: Abnormal vaginal bleeding Blood vessel disease or blood clots Breast, cervical, endometrial, ovarian, liver, or uterine cancer Dementia Diabetes Gallbladder disease Heart disease or recent heart attack High blood pressure High cholesterol High level of calcium in the blood Hysterectomy Kidney disease Liver disease Migraine headaches Protein C/S deficiency Stroke Systemic lupus erythematosus (SLE) Tobacco smoker An unusual or allergic reaction to estrogens, other hormones, medications, foods, dyes, or preservatives Pregnant or trying to get pregnant Breast-feeding How should I use this medication? This medication is for vaginal use only. Do not take by mouth. Follow the directions on the prescription label. Read package directions carefully before using. Wash hands before and after use. Use this medication at bedtime. Do not use it more often than directed. Do not stop using except on your care team's advice. Talk to your care team regarding the use of this medication in children. This medication is not approved for use in children. Overdosage: If you think you have taken too much of this medicine contact a poison control center or emergency room at once. NOTE: This medicine is only for you. Do not share this medicine with others. What if I miss a dose? If you miss a dose, use it as soon as you can. If it is almost time for your next dose, use only that dose. Do not use double or extra doses. What may interact with this medication? Do not take this  medication with any of the following: Aromatase inhibitors like aminoglutethimide, anastrozole, exemestane, letrozole, testolactone This medication may also interact with the following: Antibiotics used to treat tuberculosis like rifabutin, rifampin and rifapentine Raloxifene or tamoxifen Warfarin This list may not describe all possible interactions. Give your health care provider a list of all the medicines, herbs, non-prescription drugs, or dietary supplements you use. Also tell them if you smoke, drink alcohol, or use illegal drugs. Some items may interact with your medicine. What should I watch for while using this medication? Visit your care team for regular checks on your progress. You will need a regular breast and pelvic exam. You should also discuss the need for regular mammograms with your care team, and follow their guidelines. This medication can make your body retain fluid, making your fingers, hands, or ankles swell. Your blood pressure can go up. Contact your care team if you feel you are retaining fluid. Tobacco smoking increases the risk of getting a blood clot or having a stroke, especially if you are more than 51 years old. You are strongly advised not to smoke. If you wear contact lenses and notice visual changes, or if the lenses begin to feel uncomfortable, consult your eye care specialist. If you are going to have elective surgery, you may need to stop taking this medication beforehand. Consult your care team for advice prior to scheduling the surgery. If you have any reason to think you are pregnant; stop taking this medication at once and contact your care team. What side effects may I notice from receiving this medication? Side effects that you should report to your care team as soon as  possible: Allergic reactions--skin rash, itching, hives, swelling of the face, lips, tongue, or throat Blood clot--pain, swelling, or warmth in the leg, shortness of breath, chest  pain Breast tissue changes, new lumps, redness, pain, or discharge from the nipple Gallbladder problems--severe stomach pain, nausea, vomiting, fever Increase in blood pressure Liver injury--right upper belly pain, loss of appetite, nausea, light-colored stool, dark yellow or brown urine, yellowing skin or eyes, unusual weakness or fatigue Stroke--sudden numbness or weakness of the face, arm, or leg, trouble speaking, confusion, trouble walking, loss of balance or coordination, dizziness, severe headache, change in vision Unusual vaginal discharge, itching, or odor Vaginal bleeding after menopause, pelvic pain Side effects that usually do not require medical attention (report these to your care team if they continue or are bothersome): Bloating Breast pain or tenderness Nausea Vaginal irritation at application site Vomiting This list may not describe all possible side effects. Call your doctor for medical advice about side effects. You may report side effects to FDA at 1-800-FDA-1088. Where should I keep my medication? Keep out of the reach of children and pets. Store at room temperature between 15 and 30 degrees C (59 and 86 degrees F). Throw away any unused medication after the expiration date. NOTE: This sheet is a summary. It may not cover all possible information. If you have questions about this medicine, talk to your doctor, pharmacist, or health care provider.  2023 Elsevier/Gold Standard (2020-12-12 00:00:00)

## 2022-06-23 ENCOUNTER — Other Ambulatory Visit (HOSPITAL_COMMUNITY): Payer: Self-pay

## 2022-06-24 ENCOUNTER — Encounter: Payer: Self-pay | Admitting: Physician Assistant

## 2022-06-24 ENCOUNTER — Ambulatory Visit (INDEPENDENT_AMBULATORY_CARE_PROVIDER_SITE_OTHER): Payer: BC Managed Care – PPO | Admitting: Physician Assistant

## 2022-06-24 VITALS — BP 126/80 | HR 64 | Resp 15 | Ht 67.5 in | Wt 155.0 lb

## 2022-06-24 DIAGNOSIS — L409 Psoriasis, unspecified: Secondary | ICD-10-CM | POA: Diagnosis not present

## 2022-06-24 DIAGNOSIS — Z79899 Other long term (current) drug therapy: Secondary | ICD-10-CM | POA: Diagnosis not present

## 2022-06-24 DIAGNOSIS — G8929 Other chronic pain: Secondary | ICD-10-CM

## 2022-06-24 DIAGNOSIS — Z111 Encounter for screening for respiratory tuberculosis: Secondary | ICD-10-CM

## 2022-06-24 DIAGNOSIS — M5442 Lumbago with sciatica, left side: Secondary | ICD-10-CM

## 2022-06-24 DIAGNOSIS — L4050 Arthropathic psoriasis, unspecified: Secondary | ICD-10-CM

## 2022-06-24 DIAGNOSIS — M722 Plantar fascial fibromatosis: Secondary | ICD-10-CM

## 2022-06-24 DIAGNOSIS — D472 Monoclonal gammopathy: Secondary | ICD-10-CM

## 2022-06-24 DIAGNOSIS — M533 Sacrococcygeal disorders, not elsewhere classified: Secondary | ICD-10-CM | POA: Diagnosis not present

## 2022-06-24 DIAGNOSIS — M7061 Trochanteric bursitis, right hip: Secondary | ICD-10-CM

## 2022-06-24 DIAGNOSIS — M7062 Trochanteric bursitis, left hip: Secondary | ICD-10-CM

## 2022-06-24 DIAGNOSIS — R7989 Other specified abnormal findings of blood chemistry: Secondary | ICD-10-CM

## 2022-06-24 MED ORDER — METHOCARBAMOL 500 MG PO TABS: 500.0000 mg | ORAL_TABLET | Freq: Every day | ORAL | 0 refills | Status: DC | PRN

## 2022-06-24 NOTE — Patient Instructions (Signed)
Standing Labs We placed an order today for your standing lab work.   Please have your standing labs drawn in October and every 3 months   If possible, please have your labs drawn 2 weeks prior to your appointment so that the provider can discuss your results at your appointment.  Please note that you may see your imaging and lab results in MyChart before we have reviewed them. We may be awaiting multiple results to interpret others before contacting you. Please allow our office up to 72 hours to thoroughly review all of the results before contacting the office for clarification of your results.  We have open lab daily: Monday through Thursday from 1:30-4:30 PM and Friday from 1:30-4:00 PM at the office of Dr. Shaili Deveshwar, [facility].   Please be advised, all patients with office appointments requiring lab work will take precedent over walk-in lab work.  If possible, please come for your lab work on Monday and Friday afternoons, as you may experience shorter wait times. The office is located at [address], [address], [place], [place] [address] No appointment is necessary.   Labs are drawn by [facility]. Please bring your co-pay at the time of your lab draw.  You may receive a bill from [facility] for your lab work.  Please note if you are on Hydroxychloroquine and and an order has been placed for a Hydroxychloroquine level, you will need to have it drawn 4 hours or more after your last dose.  If you wish to have your labs drawn at another location, please call the office 24 hours in advance to send orders.  If you have any questions regarding directions or hours of operation,  please call [phone].   As a reminder, please drink plenty of water prior to coming for your lab work. Thanks!    

## 2022-06-25 LAB — CBC WITH DIFFERENTIAL/PLATELET
Absolute Monocytes: 551 cells/uL (ref 200–950)
Basophils Absolute: 29 cells/uL (ref 0–200)
Basophils Relative: 0.5 %
Eosinophils Absolute: 99 cells/uL (ref 15–500)
Eosinophils Relative: 1.7 %
HCT: 41.7 % (ref 35.0–45.0)
Hemoglobin: 14.3 g/dL (ref 11.7–15.5)
Lymphs Abs: 2001 cells/uL (ref 850–3900)
MCH: 33.1 pg — ABNORMAL HIGH (ref 27.0–33.0)
MCHC: 34.3 g/dL (ref 32.0–36.0)
MCV: 96.5 fL (ref 80.0–100.0)
MPV: 10.6 fL (ref 7.5–12.5)
Monocytes Relative: 9.5 %
Neutro Abs: 3120 cells/uL (ref 1500–7800)
Neutrophils Relative %: 53.8 %
Platelets: 208 10*3/uL (ref 140–400)
RBC: 4.32 10*6/uL (ref 3.80–5.10)
RDW: 11.3 % (ref 11.0–15.0)
Total Lymphocyte: 34.5 %
WBC: 5.8 10*3/uL (ref 3.8–10.8)

## 2022-06-25 LAB — COMPLETE METABOLIC PANEL WITH GFR
AG Ratio: 2.3 (calc) (ref 1.0–2.5)
ALT: 14 U/L (ref 6–29)
AST: 23 U/L (ref 10–35)
Albumin: 4.6 g/dL (ref 3.6–5.1)
Alkaline phosphatase (APISO): 42 U/L (ref 37–153)
BUN: 17 mg/dL (ref 7–25)
CO2: 29 mmol/L (ref 20–32)
Calcium: 9.8 mg/dL (ref 8.6–10.4)
Chloride: 102 mmol/L (ref 98–110)
Creat: 0.74 mg/dL (ref 0.50–1.03)
Globulin: 2 g/dL (calc) (ref 1.9–3.7)
Glucose, Bld: 87 mg/dL (ref 65–99)
Potassium: 4.7 mmol/L (ref 3.5–5.3)
Sodium: 137 mmol/L (ref 135–146)
Total Bilirubin: 0.6 mg/dL (ref 0.2–1.2)
Total Protein: 6.6 g/dL (ref 6.1–8.1)
eGFR: 99 mL/min/{1.73_m2} (ref >=60)

## 2022-06-25 NOTE — Progress Notes (Signed)
CBC and CMP WNL

## 2022-07-09 ENCOUNTER — Other Ambulatory Visit: Payer: Self-pay | Admitting: Physician Assistant

## 2022-07-09 DIAGNOSIS — L4050 Arthropathic psoriasis, unspecified: Secondary | ICD-10-CM

## 2022-07-09 NOTE — Telephone Encounter (Signed)
Next Visit: 12/01/2022  Last Visit: 06/24/2022  Last Fill: 03/11/2022  AJ:OINOMVEHM arthritis   Current Dose per office note 06/24/2022:  Simponi 50 mg subcutaneous injections every 28 days.    Labs: 06/24/2022 CBC and CMP WNL  TB Gold: 02/17/2022 Neg   Okay to refill Simponi?

## 2022-07-11 MED ORDER — SIMPONI 50 MG/0.5ML ~~LOC~~ SOAJ
SUBCUTANEOUS | 2 refills | Status: DC
  Filled 2022-07-11: qty 0.5, fill #0
  Filled 2022-08-25: qty 0.5, 28d supply, fill #0
  Filled 2022-09-09: qty 0.5, 28d supply, fill #1
  Filled 2022-10-07 (×2): qty 0.5, 28d supply, fill #2

## 2022-07-12 ENCOUNTER — Other Ambulatory Visit (HOSPITAL_COMMUNITY): Payer: Self-pay

## 2022-07-14 ENCOUNTER — Other Ambulatory Visit (HOSPITAL_COMMUNITY): Payer: Self-pay

## 2022-07-14 DIAGNOSIS — J029 Acute pharyngitis, unspecified: Secondary | ICD-10-CM | POA: Diagnosis not present

## 2022-07-14 DIAGNOSIS — Z03818 Encounter for observation for suspected exposure to other biological agents ruled out: Secondary | ICD-10-CM | POA: Diagnosis not present

## 2022-07-14 DIAGNOSIS — J069 Acute upper respiratory infection, unspecified: Secondary | ICD-10-CM | POA: Diagnosis not present

## 2022-07-14 DIAGNOSIS — R051 Acute cough: Secondary | ICD-10-CM | POA: Diagnosis not present

## 2022-07-22 ENCOUNTER — Other Ambulatory Visit (HOSPITAL_COMMUNITY): Payer: Self-pay

## 2022-07-30 ENCOUNTER — Other Ambulatory Visit (HOSPITAL_COMMUNITY): Payer: Self-pay

## 2022-08-13 ENCOUNTER — Telehealth: Payer: Self-pay | Admitting: Rheumatology

## 2022-08-13 ENCOUNTER — Other Ambulatory Visit (HOSPITAL_COMMUNITY): Payer: Self-pay

## 2022-08-13 NOTE — Telephone Encounter (Signed)
Left message to advise patient she may come by the office for a sample. Advised patient our pharmacy team has submitted an urgent PA so she will be able to get her next injection at the pharmacy. Sample reserved in fridge for patient.

## 2022-08-13 NOTE — Telephone Encounter (Signed)
Per notes in [place], message about prior auth renewal being needed was sent to pharmacy team. Marena Chancy if addressed by patient advocate  Submitted an URGENT Prior Authorization request to [facility] for [facility] via CoverMyMeds. Will update once we receive a response.  Key: [id number]  Knox Saliva, PharmD, MPH, BCPS, CPP Clinical Pharmacist (Rheumatology and Pulmonology)

## 2022-08-13 NOTE — Telephone Encounter (Signed)
Medication Samples have been provided to the patient.  Drug name: Simponi       Strength: 45 mg        Qty: 1  LOT: [id number]  Exp.Date: 03/2024  Dosing instructions: Inject one pen into skin every 25 days.

## 2022-08-13 NOTE — Telephone Encounter (Signed)
Patient states she is having difficulty getting her Simponi prescription authorized.  Patient requested a return call to let her know if she can stop by the office today to pick up a sample.

## 2022-08-13 NOTE — Telephone Encounter (Signed)
Mariann Laster from [place] called the office stating the Simponi prior authorization for the patient had been approved. The approval letter will be faxed to the office shortly.

## 2022-08-16 NOTE — Telephone Encounter (Addendum)
Received notification from [facility] regarding a prior authorization for [facility]. Authorization has been APPROVED from 08/13/22 to 08/11/22.  Patient can continue to fill through [place]: [phone]   Authorization # [place] addended  Knox Saliva, PharmD, MPH, BCPS, CPP Clinical Pharmacist (Rheumatology and Pulmonology)

## 2022-08-17 ENCOUNTER — Other Ambulatory Visit (HOSPITAL_COMMUNITY): Payer: Self-pay

## 2022-08-19 ENCOUNTER — Other Ambulatory Visit (HOSPITAL_COMMUNITY): Payer: Self-pay

## 2022-08-23 ENCOUNTER — Other Ambulatory Visit (HOSPITAL_COMMUNITY): Payer: Self-pay

## 2022-08-25 ENCOUNTER — Other Ambulatory Visit (HOSPITAL_COMMUNITY): Payer: Self-pay

## 2022-08-26 ENCOUNTER — Other Ambulatory Visit (HOSPITAL_COMMUNITY): Payer: Self-pay

## 2022-09-09 ENCOUNTER — Other Ambulatory Visit (HOSPITAL_COMMUNITY): Payer: Self-pay

## 2022-09-16 ENCOUNTER — Ambulatory Visit (INDEPENDENT_AMBULATORY_CARE_PROVIDER_SITE_OTHER): Payer: BC Managed Care – PPO | Admitting: Family Medicine

## 2022-09-16 ENCOUNTER — Encounter: Payer: Self-pay | Admitting: Family Medicine

## 2022-09-16 ENCOUNTER — Ambulatory Visit: Payer: Self-pay

## 2022-09-16 ENCOUNTER — Other Ambulatory Visit (HOSPITAL_COMMUNITY): Payer: Self-pay

## 2022-09-16 VITALS — BP 114/70 | Ht 67.5 in | Wt 155.0 lb

## 2022-09-16 DIAGNOSIS — S73192A Other sprain of left hip, initial encounter: Secondary | ICD-10-CM

## 2022-09-16 DIAGNOSIS — M2170 Unequal limb length (acquired), unspecified site: Secondary | ICD-10-CM | POA: Insufficient documentation

## 2022-09-16 MED ORDER — TRIAMCINOLONE ACETONIDE 40 MG/ML IJ SUSP
40.0000 mg | Freq: Once | INTRAMUSCULAR | Status: AC
  Administered 2022-09-16: 40 mg via INTRA_ARTICULAR

## 2022-09-16 NOTE — Patient Instructions (Signed)
Nice to meet you Please try heat on the hip  Please try the exercises  Please try the heel lift.  We can consider custom orthotics  Please send me a message in MyChart with any questions or updates.  Please see me back in 4-6 weeks.   --Dr. Raeford Razor

## 2022-09-16 NOTE — Assessment & Plan Note (Signed)
Has underlying scoliosis that is playing a role.  - green sport insoles with left heel lift  - could consider custom orthotics.

## 2022-09-16 NOTE — Progress Notes (Signed)
  Samantha Graves - 51 y.o. female MRN [id number]  Date of birth: 1971/09/14  SUBJECTIVE:  Including CC & ROS.  No chief complaint on file.   Samantha Graves is a 51 y.o. female that is  presenting with left hip pain and leg length discrepancy. Has received multiple injections into the left lateral bursa. Did not have improvement with past two injections. Has a history of psoriasis and psoriatic arthritis.    Review of Systems See HPI   HISTORY: Past Medical, Surgical, Social, and Family History Reviewed & Updated per EMR.   Pertinent Historical Findings include:  Past Medical History:  Diagnosis Date   ADHD    Bile duct injury 2020   mild   Cholecystitis    Elevated LFTs    MGUS (monoclonal gammopathy of unknown significance) 2021   PONV (postoperative nausea and vomiting)    Psoriatic arthritis ([place])    Rib fracture     Past Surgical History:  Procedure Laterality Date   CHOLECYSTECTOMY N/A 08/13/2019   Procedure: LAPAROSCOPIC CHOLECYSTECTOMY;  Surgeon: Rolm Bookbinder, MD;  Location: [place];  Service: General;  Laterality: N/A;   INTRAUTERINE DEVICE INSERTION  05/30/15, 05/06/10   MIrena IUD   TONSILLECTOMY AND ADENOIDECTOMY  1990   WISDOM TOOTH EXTRACTION Bilateral 1991     PHYSICAL EXAM:  VS: BP 114/70 (BP Location: Left Arm, Patient Position: Sitting)   Ht 5' 7.5" (1.715 m)   Wt 155 lb (70.3 kg)   BMI 23.92 kg/m  Physical Exam Gen: NAD, alert, cooperative with exam, well-appearing MSK:  Neurovascularly intact     Aspiration/Injection Procedure Note Samantha Graves 1971-03-01  Procedure: Injection Indications: left hip pain  Procedure Details Consent: Risks of procedure as well as the alternatives and risks of each were explained to the (patient/caregiver).  Consent for procedure obtained. Time Out: Verified patient identification, verified procedure, site/side was marked, verified correct patient position, special equipment/implants  available, medications/allergies/relevent history reviewed, required imaging and test results available.  Performed.  The area was cleaned with iodine and alcohol swabs.    The left hip pain was injected using 3 cc of 1% lidocaine on a 22-gauge 1/2 inch needle.  The syringe was switched and a mixture containing 1 cc's of 40 mg Kenalog and 4 cc's of 0.25% bupivacaine was injected.  Ultrasound was used. Images were obtained in long views showing the injection.     A sterile dressing was applied.  Patient did tolerate procedure well.     ASSESSMENT & PLAN:   Tear of left acetabular labrum Acutely occurring. Having pain in a lateral distribution around the joint. Has tried bursa injections previously. She is a runner and her left leg is shorter than the right. May have a component of nerve impingement.  - counseled on home exercise therapy and supportive care - injection today - could consider further imaging or PT   Leg length discrepancy Has underlying scoliosis that is playing a role.  - green sport insoles with left heel lift  - could consider custom orthotics.

## 2022-09-16 NOTE — Assessment & Plan Note (Signed)
Acutely occurring. Having pain in a lateral distribution around the joint. Has tried bursa injections previously. She is a runner and her left leg is shorter than the right. May have a component of nerve impingement.  - counseled on home exercise therapy and supportive care - injection today - could consider further imaging or PT

## 2022-10-07 ENCOUNTER — Other Ambulatory Visit (HOSPITAL_COMMUNITY): Payer: Self-pay

## 2022-10-13 ENCOUNTER — Other Ambulatory Visit (HOSPITAL_COMMUNITY): Payer: Self-pay

## 2022-10-13 ENCOUNTER — Ambulatory Visit: Payer: BC Managed Care – PPO | Admitting: Family Medicine

## 2022-10-20 ENCOUNTER — Ambulatory Visit (INDEPENDENT_AMBULATORY_CARE_PROVIDER_SITE_OTHER): Payer: BC Managed Care – PPO | Admitting: Family Medicine

## 2022-10-20 ENCOUNTER — Encounter: Payer: Self-pay | Admitting: Family Medicine

## 2022-10-20 VITALS — BP 118/68 | Ht 67.0 in | Wt 145.0 lb

## 2022-10-20 DIAGNOSIS — S73192D Other sprain of left hip, subsequent encounter: Secondary | ICD-10-CM | POA: Diagnosis not present

## 2022-10-20 NOTE — Assessment & Plan Note (Signed)
Doing well since the injection. Insoles and heel lifts have helped.  - counseled on home exercise therapy and supportive care - could consider zilretta, PRP or custom orthotics.

## 2022-10-20 NOTE — Progress Notes (Signed)
  Samantha Graves - 51 y.o. female MRN [id number]  Date of birth: Feb 22, 1971  SUBJECTIVE:  Including CC & ROS.  No chief complaint on file.   Samantha Graves is a 51 y.o. female that is  following up for her left hip pain. Has done well with the insoles and the injection.    Review of Systems See HPI   HISTORY: Past Medical, Surgical, Social, and Family History Reviewed & Updated per EMR.   Pertinent Historical Findings include:  Past Medical History:  Diagnosis Date   ADHD    Bile duct injury 2020   mild   Cholecystitis    Elevated LFTs    MGUS (monoclonal gammopathy of unknown significance) 2021   PONV (postoperative nausea and vomiting)    Psoriatic arthritis ([place])    Rib fracture     Past Surgical History:  Procedure Laterality Date   CHOLECYSTECTOMY N/A 08/13/2019   Procedure: LAPAROSCOPIC CHOLECYSTECTOMY;  Surgeon: Rolm Bookbinder, MD;  Location: [place];  Service: General;  Laterality: N/A;   INTRAUTERINE DEVICE INSERTION  05/30/15, 05/06/10   MIrena IUD   TONSILLECTOMY AND ADENOIDECTOMY  1990   WISDOM TOOTH EXTRACTION Bilateral 1991     PHYSICAL EXAM:  VS: BP 118/68   Ht '5\' 7"'$  (1.702 m)   Wt 145 lb (65.8 kg)   BMI 22.71 kg/m  Physical Exam Gen: NAD, alert, cooperative with exam, well-appearing MSK:  Neurovascularly intact       ASSESSMENT & PLAN:   Tear of left acetabular labrum Doing well since the injection. Insoles and heel lifts have helped.  - counseled on home exercise therapy and supportive care - could consider zilretta, PRP or custom orthotics.

## 2022-11-02 ENCOUNTER — Other Ambulatory Visit (HOSPITAL_COMMUNITY): Payer: Self-pay

## 2022-11-04 ENCOUNTER — Other Ambulatory Visit (HOSPITAL_COMMUNITY): Payer: Self-pay

## 2022-11-08 ENCOUNTER — Other Ambulatory Visit (HOSPITAL_COMMUNITY): Payer: Self-pay

## 2022-11-10 ENCOUNTER — Other Ambulatory Visit (HOSPITAL_COMMUNITY): Payer: Self-pay

## 2022-11-10 ENCOUNTER — Other Ambulatory Visit: Payer: Self-pay | Admitting: Rheumatology

## 2022-11-10 DIAGNOSIS — L4050 Arthropathic psoriasis, unspecified: Secondary | ICD-10-CM

## 2022-11-18 NOTE — Progress Notes (Deleted)
Office Visit Note  Patient: Samantha Graves             Date of Birth: 10/22/1971           MRN: [id number]             PCP: Fay Records, MD Referring: Fay Records, MD Visit Date: 12/01/2022 Occupation: '@GUAROCC'$ @  Subjective:  No chief complaint on file.   History of Present Illness: Samantha Graves Weesner is a 51 y.o. female ***     Activities of Daily Living:  Patient reports morning stiffness for *** {minute/hour:19697}.   Patient {ACTIONS;DENIES/REPORTS:21021675::"Denies"} nocturnal pain.  Difficulty dressing/grooming: {ACTIONS;DENIES/REPORTS:21021675::"Denies"} Difficulty climbing stairs: {ACTIONS;DENIES/REPORTS:21021675::"Denies"} Difficulty getting out of chair: {ACTIONS;DENIES/REPORTS:21021675::"Denies"} Difficulty using hands for taps, buttons, cutlery, and/or writing: {ACTIONS;DENIES/REPORTS:21021675::"Denies"}  No Rheumatology ROS completed.   PMFS History:  Patient Active Problem List   Diagnosis Date Noted   Leg length discrepancy 09/16/2022   Tear of left acetabular labrum 09/16/2022   Psoriasis 01/11/2017   High risk medication use 01/11/2017   PSORIATIC ARTHRITIS 10/14/2009   ALLERGIC RHINITIS 09/06/2007    Past Medical History:  Diagnosis Date   ADHD    Bile duct injury 2020   mild   Cholecystitis    Elevated LFTs    MGUS (monoclonal gammopathy of unknown significance) 2021   PONV (postoperative nausea and vomiting)    Psoriatic arthritis ([place])    Rib fracture     Family History  Problem Relation Age of Onset   Hyperlipidemia Mother    Ovarian cysts Mother        benign   Endometriosis Mother    Liver disease Mother        NASH   Other Father        auto imune   Coronary artery disease Father        cardio ablation   COPD Maternal Grandmother    Bone cancer Maternal Grandfather 64   Arthritis/Rheumatoid Sister 7   Endometriosis Sister    Retinitis pigmentosa Sister 13   Ulcerative colitis Sister 21       surgical repair, J  pouch   Ovarian cancer Sister 5       negative genetic testing   Liver disease Maternal Aunt        NASH   Colon cancer Neg Hx    Esophageal cancer Neg Hx    Pancreatic cancer Neg Hx    Stomach cancer Neg Hx    Past Surgical History:  Procedure Laterality Date   CHOLECYSTECTOMY N/A 08/13/2019   Procedure: LAPAROSCOPIC CHOLECYSTECTOMY;  Surgeon: Rolm Bookbinder, MD;  Location: [place];  Service: General;  Laterality: N/A;   INTRAUTERINE DEVICE INSERTION  05/30/15, 05/06/10   MIrena IUD   TONSILLECTOMY AND ADENOIDECTOMY  1990   WISDOM TOOTH EXTRACTION Bilateral 1991   Social History   Social History Narrative   Not on file   Immunization History  Administered Date(s) Administered   Influenza Whole 08/30/2003   Influenza,inj,Quad PF,6+ Mos 09/01/2015, 09/14/2019, 11/14/2020   PFIZER(Purple Top)SARS-COV-2 Vaccination 02/09/2020, 03/03/2020, 09/02/2020   Tdap 02/25/2011, 02/17/2021     Objective: Vital Signs: There were no vitals taken for this visit.   Physical Exam   Musculoskeletal Exam: ***  CDAI Exam: CDAI Score: -- Patient Global: --; Provider Global: -- Swollen: --; Tender: -- Joint Exam 12/01/2022   No joint exam has been documented for this visit   There is currently no information documented on the homunculus. Go to the  Rheumatology activity and complete the homunculus joint exam.  Investigation: No additional findings.  Imaging: No results found.  Recent Labs: Lab Results  Component Value Date   WBC 5.8 06/24/2022   HGB 14.3 06/24/2022   PLT 208 06/24/2022   NA 137 06/24/2022   K 4.7 06/24/2022   CL 102 06/24/2022   CO2 29 06/24/2022   GLUCOSE 87 06/24/2022   BUN 17 06/24/2022   CREATININE 0.74 06/24/2022   BILITOT 0.6 06/24/2022   ALKPHOS 41 03/17/2021   AST 23 06/24/2022   ALT 14 06/24/2022   PROT 6.6 06/24/2022   ALBUMIN 4.2 03/17/2021   CALCIUM 9.8 06/24/2022   GFRAA 107 03/16/2021   QFTBGOLD NEGATIVE 11/04/2017   QFTBGOLDPLUS  NEGATIVE 02/17/2022    Speciality Comments: Enbrel, Humira-inadequate response Simponi-04/22 Leflunomide-patient did not to start as she was concerned about the side effects of elevated LFTs.  Procedures:  No procedures performed Allergies: Tamiflu [oseltamivir phosphate]   Assessment / Plan:     Visit Diagnoses: No diagnosis found.  Orders: No orders of the defined types were placed in this encounter.  No orders of the defined types were placed in this encounter.   Face-to-face time spent with patient was *** minutes. Greater than 50% of time was spent in counseling and coordination of care.  Follow-Up Instructions: No follow-ups on file.   Earnestine Mealing, CMA  Note - This record has been created using Editor, commissioning.  Chart creation errors have been sought, but may not always  have been located. Such creation errors do not reflect on  the standard of medical care.

## 2022-11-30 NOTE — Progress Notes (Unsigned)
Office Visit Note  Patient: Samantha Graves             Date of Birth: 12/24/1970           MRN: [id number]             PCP: Fay Records, MD Referring: Fay Records, MD Visit Date: 12/02/2022 Occupation: '@GUAROCC'$ @  Subjective:  No chief complaint on file.   History of Present Illness: Samantha Graves is a 52 y.o. female ***     Activities of Daily Living:  Patient reports morning stiffness for *** {minute/hour:19697}.   Patient {ACTIONS;DENIES/REPORTS:21021675::"Denies"} nocturnal pain.  Difficulty dressing/grooming: {ACTIONS;DENIES/REPORTS:21021675::"Denies"} Difficulty climbing stairs: {ACTIONS;DENIES/REPORTS:21021675::"Denies"} Difficulty getting out of chair: {ACTIONS;DENIES/REPORTS:21021675::"Denies"} Difficulty using hands for taps, buttons, cutlery, and/or writing: {ACTIONS;DENIES/REPORTS:21021675::"Denies"}  No Rheumatology ROS completed.   PMFS History:  Patient Active Problem List   Diagnosis Date Noted   Leg length discrepancy 09/16/2022   Tear of left acetabular labrum 09/16/2022   Psoriasis 01/11/2017   High risk medication use 01/11/2017   PSORIATIC ARTHRITIS 10/14/2009   ALLERGIC RHINITIS 09/06/2007    Past Medical History:  Diagnosis Date   ADHD    Bile duct injury 2020   mild   Cholecystitis    Elevated LFTs    MGUS (monoclonal gammopathy of unknown significance) 2021   PONV (postoperative nausea and vomiting)    Psoriatic arthritis ([place])    Rib fracture     Family History  Problem Relation Age of Onset   Hyperlipidemia Mother    Ovarian cysts Mother        benign   Endometriosis Mother    Liver disease Mother        NASH   Other Father        auto imune   Coronary artery disease Father        cardio ablation   COPD Maternal Grandmother    Bone cancer Maternal Grandfather 58   Arthritis/Rheumatoid Sister 20   Endometriosis Sister    Retinitis pigmentosa Sister 39   Ulcerative colitis Sister 21       surgical repair, J  pouch   Ovarian cancer Sister 30       negative genetic testing   Liver disease Maternal Aunt        NASH   Colon cancer Neg Hx    Esophageal cancer Neg Hx    Pancreatic cancer Neg Hx    Stomach cancer Neg Hx    Past Surgical History:  Procedure Laterality Date   CHOLECYSTECTOMY N/A 08/13/2019   Procedure: LAPAROSCOPIC CHOLECYSTECTOMY;  Surgeon: Rolm Bookbinder, MD;  Location: [place];  Service: General;  Laterality: N/A;   INTRAUTERINE DEVICE INSERTION  05/30/15, 05/06/10   MIrena IUD   TONSILLECTOMY AND ADENOIDECTOMY  1990   WISDOM TOOTH EXTRACTION Bilateral 1991   Social History   Social History Narrative   Not on file   Immunization History  Administered Date(s) Administered   Influenza Whole 08/30/2003   Influenza,inj,Quad PF,6+ Mos 09/01/2015, 09/14/2019, 11/14/2020   PFIZER(Purple Top)SARS-COV-2 Vaccination 02/09/2020, 03/03/2020, 09/02/2020   Tdap 02/25/2011, 02/17/2021     Objective: Vital Signs: There were no vitals taken for this visit.   Physical Exam   Musculoskeletal Exam: ***  CDAI Exam: CDAI Score: -- Patient Global: --; Provider Global: -- Swollen: --; Tender: -- Joint Exam 12/02/2022   No joint exam has been documented for this visit   There is currently no information documented on the homunculus. Go to the  Rheumatology activity and complete the homunculus joint exam.  Investigation: No additional findings.  Imaging: No results found.  Recent Labs: Lab Results  Component Value Date   WBC 5.8 06/24/2022   HGB 14.3 06/24/2022   PLT 208 06/24/2022   NA 137 06/24/2022   K 4.7 06/24/2022   CL 102 06/24/2022   CO2 29 06/24/2022   GLUCOSE 87 06/24/2022   BUN 17 06/24/2022   CREATININE 0.74 06/24/2022   BILITOT 0.6 06/24/2022   ALKPHOS 41 03/17/2021   AST 23 06/24/2022   ALT 14 06/24/2022   PROT 6.6 06/24/2022   ALBUMIN 4.2 03/17/2021   CALCIUM 9.8 06/24/2022   GFRAA 107 03/16/2021   QFTBGOLD NEGATIVE 11/04/2017   QFTBGOLDPLUS  NEGATIVE 02/17/2022    Speciality Comments: Enbrel, Humira-inadequate response Simponi-04/22 Leflunomide-patient did not to start as she was concerned about the side effects of elevated LFTs.  Procedures:  No procedures performed Allergies: Tamiflu [oseltamivir phosphate]   Assessment / Plan:     Visit Diagnoses: No diagnosis found.  Orders: No orders of the defined types were placed in this encounter.  No orders of the defined types were placed in this encounter.   Face-to-face time spent with patient was *** minutes. Greater than 50% of time was spent in counseling and coordination of care.  Follow-Up Instructions: No follow-ups on file.   Earnestine Mealing, CMA  Note - This record has been created using Editor, commissioning.  Chart creation errors have been sought, but may not always  have been located. Such creation errors do not reflect on  the standard of medical care.

## 2022-12-01 ENCOUNTER — Ambulatory Visit: Payer: BC Managed Care – PPO | Admitting: Rheumatology

## 2022-12-01 DIAGNOSIS — G8929 Other chronic pain: Secondary | ICD-10-CM

## 2022-12-01 DIAGNOSIS — R7989 Other specified abnormal findings of blood chemistry: Secondary | ICD-10-CM

## 2022-12-01 DIAGNOSIS — L409 Psoriasis, unspecified: Secondary | ICD-10-CM

## 2022-12-01 DIAGNOSIS — M7061 Trochanteric bursitis, right hip: Secondary | ICD-10-CM

## 2022-12-01 DIAGNOSIS — M722 Plantar fascial fibromatosis: Secondary | ICD-10-CM

## 2022-12-01 DIAGNOSIS — Z79899 Other long term (current) drug therapy: Secondary | ICD-10-CM

## 2022-12-01 DIAGNOSIS — L4050 Arthropathic psoriasis, unspecified: Secondary | ICD-10-CM

## 2022-12-01 DIAGNOSIS — D472 Monoclonal gammopathy: Secondary | ICD-10-CM

## 2022-12-02 ENCOUNTER — Ambulatory Visit (INDEPENDENT_AMBULATORY_CARE_PROVIDER_SITE_OTHER): Payer: BC Managed Care – PPO

## 2022-12-02 ENCOUNTER — Ambulatory Visit: Payer: BC Managed Care – PPO | Attending: Rheumatology | Admitting: Rheumatology

## 2022-12-02 ENCOUNTER — Ambulatory Visit: Payer: BC Managed Care – PPO

## 2022-12-02 ENCOUNTER — Encounter: Payer: Self-pay | Admitting: Rheumatology

## 2022-12-02 VITALS — BP 132/80 | HR 66 | Resp 14 | Ht 67.0 in | Wt 153.2 lb

## 2022-12-02 DIAGNOSIS — Z79899 Other long term (current) drug therapy: Secondary | ICD-10-CM | POA: Diagnosis not present

## 2022-12-02 DIAGNOSIS — M79672 Pain in left foot: Secondary | ICD-10-CM

## 2022-12-02 DIAGNOSIS — M722 Plantar fascial fibromatosis: Secondary | ICD-10-CM

## 2022-12-02 DIAGNOSIS — M79641 Pain in right hand: Secondary | ICD-10-CM

## 2022-12-02 DIAGNOSIS — M79671 Pain in right foot: Secondary | ICD-10-CM

## 2022-12-02 DIAGNOSIS — L4050 Arthropathic psoriasis, unspecified: Secondary | ICD-10-CM | POA: Diagnosis not present

## 2022-12-02 DIAGNOSIS — M79642 Pain in left hand: Secondary | ICD-10-CM | POA: Diagnosis not present

## 2022-12-02 DIAGNOSIS — M5442 Lumbago with sciatica, left side: Secondary | ICD-10-CM

## 2022-12-02 DIAGNOSIS — L409 Psoriasis, unspecified: Secondary | ICD-10-CM

## 2022-12-02 DIAGNOSIS — M7061 Trochanteric bursitis, right hip: Secondary | ICD-10-CM

## 2022-12-02 DIAGNOSIS — M7062 Trochanteric bursitis, left hip: Secondary | ICD-10-CM

## 2022-12-02 DIAGNOSIS — D472 Monoclonal gammopathy: Secondary | ICD-10-CM

## 2022-12-02 DIAGNOSIS — R7989 Other specified abnormal findings of blood chemistry: Secondary | ICD-10-CM

## 2022-12-02 DIAGNOSIS — G8929 Other chronic pain: Secondary | ICD-10-CM

## 2022-12-02 DIAGNOSIS — M25552 Pain in left hip: Secondary | ICD-10-CM

## 2022-12-02 DIAGNOSIS — M533 Sacrococcygeal disorders, not elsewhere classified: Secondary | ICD-10-CM

## 2022-12-02 MED ORDER — SIMPONI 50 MG/0.5ML ~~LOC~~ SOAJ: SUBCUTANEOUS | 2 refills | Status: DC

## 2022-12-02 NOTE — Progress Notes (Signed)
Medication Samples have been provided to the patient.  Drug name: Simponi       Strength: '50mg'$ /0.13m        Qty: 1  LOT: [id number] Exp.Date: 04/28/2024  Dosing instructions: Inject '50mg'$  into the skin every 28 days.

## 2022-12-02 NOTE — Patient Instructions (Signed)
Standing Labs We placed an order today for your standing lab work.   Please have your standing labs drawn in April and every 3 months  Please have your labs drawn 2 weeks prior to your appointment so that the provider can discuss your lab results at your appointment.  Please note that you may see your imaging and lab results in [place] before we have reviewed them. We will contact you once all results are reviewed. Please allow our office up to 72 hours to thoroughly review all of the results before contacting the office for clarification of your results.  Lab hours are:   Monday through Thursday from 8:00 am -12:30 pm and 1:00 pm-5:00 pm and Friday from 8:00 am-12:00 pm.  Please be advised, all patients with office appointments requiring lab work will take precedent over walk-in lab work.   Labs are drawn by [facility]. Please bring your co-pay at the time of your lab draw.  You may receive a bill from [facility] for your lab work.  Please note if you are on Hydroxychloroquine and and an order has been placed for a Hydroxychloroquine level, you will need to have it drawn 4 hours or more after your last dose.  If you wish to have your labs drawn at another location, please call the office 24 hours in advance so we can fax the orders.  The office is located at [address] [place], [place], [place] [address] No appointment is necessary.    If you have any questions regarding directions or hours of operation,  please call [phone].   As a reminder, please drink plenty of water prior to coming for your lab work. Thanks!   Vaccines You are taking a medication(s) that can suppress your immune system.  The following immunizations are recommended: Flu annually Covid-19  Td/Tdap (tetanus, diphtheria, pertussis) every 10 years Pneumonia (Prevnar 15 then Pneumovax 23 at least 1 year apart.  Alternatively, can take Prevnar 20 without needing additional dose) Shingrix: 2 doses from 4 weeks to  6 months apart  Please check with your PCP to make sure you are up to date.   If you have signs or symptoms of an infection or start antibiotics: First, call your PCP for workup of your infection. Hold your medication through the infection, until you complete your antibiotics, and until symptoms resolve if you take the following: Injectable medication (Actemra, Benlysta, Cimzia, Cosentyx, Enbrel, Humira, Kevzara, Orencia, Remicade, Simponi, Stelara, Taltz, Tremfya) Methotrexate Leflunomide (Arava) Mycophenolate (Cellcept) Morrie Sheldon, Olumiant, or Rinvoq  Please get an annual skin examination to screen for skin cancer while you are on Simponi.

## 2022-12-04 LAB — QUANTIFERON-TB GOLD PLUS
Mitogen-NIL: >10 IU/mL
NIL: 0.04 IU/mL
QuantiFERON-TB Gold Plus: NEGATIVE
TB1-NIL: 0 IU/mL
TB2-NIL: <0 IU/mL

## 2022-12-04 LAB — COMPLETE METABOLIC PANEL WITH GFR
AG Ratio: 1.8 (calc) (ref 1.0–2.5)
ALT: 13 U/L (ref 6–29)
AST: 18 U/L (ref 10–35)
Albumin: 4 g/dL (ref 3.6–5.1)
Alkaline phosphatase (APISO): 35 U/L — ABNORMAL LOW (ref 37–153)
BUN: 22 mg/dL (ref 7–25)
CO2: 29 mmol/L (ref 20–32)
Calcium: 8.8 mg/dL (ref 8.6–10.4)
Chloride: 107 mmol/L (ref 98–110)
Creat: 0.83 mg/dL (ref 0.50–1.03)
Globulin: 2.2 g/dL (calc) (ref 1.9–3.7)
Glucose, Bld: 97 mg/dL (ref 65–99)
Potassium: 4.3 mmol/L (ref 3.5–5.3)
Sodium: 142 mmol/L (ref 135–146)
Total Bilirubin: 0.4 mg/dL (ref 0.2–1.2)
Total Protein: 6.2 g/dL (ref 6.1–8.1)
eGFR: 85 mL/min/{1.73_m2} (ref >=60)

## 2022-12-04 LAB — CBC WITH DIFFERENTIAL/PLATELET
Absolute Monocytes: 391 cells/uL (ref 200–950)
Basophils Absolute: 32 cells/uL (ref 0–200)
Basophils Relative: 0.5 %
Eosinophils Absolute: 82 cells/uL (ref 15–500)
Eosinophils Relative: 1.3 %
HCT: 38.1 % (ref 35.0–45.0)
Hemoglobin: 12.9 g/dL (ref 11.7–15.5)
Lymphs Abs: 1915 cells/uL (ref 850–3900)
MCH: 32.3 pg (ref 27.0–33.0)
MCHC: 33.9 g/dL (ref 32.0–36.0)
MCV: 95.5 fL (ref 80.0–100.0)
MPV: 10.6 fL (ref 7.5–12.5)
Monocytes Relative: 6.2 %
Neutro Abs: 3881 cells/uL (ref 1500–7800)
Neutrophils Relative %: 61.6 %
Platelets: 185 10*3/uL (ref 140–400)
RBC: 3.99 10*6/uL (ref 3.80–5.10)
RDW: 11.3 % (ref 11.0–15.0)
Total Lymphocyte: 30.4 %
WBC: 6.3 10*3/uL (ref 3.8–10.8)

## 2022-12-04 LAB — SEDIMENTATION RATE: Sed Rate: 2 mm/h (ref 0–30)

## 2022-12-06 NOTE — Progress Notes (Signed)
CBC, CMP, sed rate are within normal limits.  TB Gold is negative.

## 2022-12-10 ENCOUNTER — Other Ambulatory Visit (HOSPITAL_COMMUNITY): Payer: Self-pay

## 2022-12-11 ENCOUNTER — Telehealth: Payer: BC Managed Care – PPO | Admitting: Nurse Practitioner

## 2022-12-11 DIAGNOSIS — U071 COVID-19: Secondary | ICD-10-CM

## 2022-12-11 MED ORDER — MOLNUPIRAVIR EUA 200MG CAPSULE: 4.0000 | ORAL_CAPSULE | Freq: Two times a day (BID) | ORAL | 0 refills | Status: AC

## 2022-12-11 NOTE — Progress Notes (Signed)
Virtual Visit Consent   Samantha Graves, you are scheduled for a virtual visit with a [facility] provider today. Just as with appointments in the office, your consent must be obtained to participate. Your consent will be active for this visit and any virtual visit you may have with one of our providers in the next 365 days. If you have a MyChart account, a copy of this consent can be sent to you electronically.  As this is a virtual visit, video technology does not allow for your provider to perform a traditional examination. This may limit your provider's ability to fully assess your condition. If your provider identifies any concerns that need to be evaluated in person or the need to arrange testing (such as labs, EKG, etc.), we will make arrangements to do so. Although advances in technology are sophisticated, we cannot ensure that it will always work on either your end or our end. If the connection with a video visit is poor, the visit may have to be switched to a telephone visit. With either a video or telephone visit, we are not always able to ensure that we have a secure connection.  By engaging in this virtual visit, you consent to the provision of healthcare and authorize for your insurance to be billed (if applicable) for the services provided during this visit. Depending on your insurance coverage, you may receive a charge related to this service.  I need to obtain your verbal consent now. Are you willing to proceed with your visit today? [name] has provided verbal consent on 12/11/2022 for a virtual visit (video or telephone). Apolonio Schneiders, FNP  Date: 12/11/2022 4:13 PM  Virtual Visit via Video Note   I, Apolonio Schneiders, connected with  Samantha Graves  ([id number], Sep 13, 1971) on 12/11/22 at  4:30 PM EST by a video-enabled telemedicine application and verified that I am speaking with the correct person using two identifiers.  Location: Patient: Virtual Visit Location  Patient: Home Provider: Virtual Visit Location Provider: Home Office   I discussed the limitations of evaluation and management by telemedicine and the availability of in person appointments. The patient expressed understanding and agreed to proceed.    History of Present Illness: Samantha Graves is a 52 y.o. who identifies as a female who was assigned female at birth, and is being seen today for COVID treatment.  She started to feel sick yesterday  This morning she woke up and felt very ill with body aches, headaches, skin hurts, no fever, mild cough- barky and deep and dry  Clear runny nose    She has had COVID in the past (2 years ago)  No complications   She has been vaccinated without recent booster   Problems:  Patient Active Problem List   Diagnosis Date Noted   Leg length discrepancy 09/16/2022   Tear of left acetabular labrum 09/16/2022   Psoriasis 01/11/2017   High risk medication use 01/11/2017   PSORIATIC ARTHRITIS 10/14/2009   ALLERGIC RHINITIS 09/06/2007    Allergies:  Allergies  Allergen Reactions   Tamiflu [Oseltamivir Phosphate] Nausea And Vomiting   Medications:  Current Outpatient Medications:    amphetamine-dextroamphetamine (ADDERALL) 10 MG tablet, 1 TABLET BY MOUTH ONCE A DAY AS NEEDED DEPENDING UPON THE DEMANDS OF THE AFTERNOON, Disp: , Rfl:    Cholecalciferol (VITAMIN D3) 125 MCG (5000 UT) CAPS, Take 1 capsule by mouth daily., Disp: , Rfl:    EVEKEO 10 MG TABS, Take 10 mg by mouth  as needed., Disp: , Rfl: 0   Golimumab (SIMPONI) 50 MG/0.5ML SOAJ, INJECT 50 MG INTO THE SKIN EVERY 28 DAYS., Disp: 0.5 mL, Rfl: 2   levonorgestrel (MIRENA) 20 MCG/24HR IUD, 1 each by Intrauterine route once. Mirena IUD., Disp: , Rfl:    methocarbamol (ROBAXIN) 500 MG tablet, Take 1 tablet (500 mg total) by mouth daily as needed for muscle spasms., Disp: 14 tablet, Rfl: 0  Observations/Objective: Patient is well-developed, well-nourished in no acute distress.   Resting comfortably  at home.  Head is normocephalic, atraumatic.  No labored breathing.  Speech is clear and coherent with logical content.  Patient is alert and oriented at baseline.    Assessment and Plan: 1. COVID-19  - molnupiravir EUA (LAGEVRIO) 200 mg CAPS capsule; Take 4 capsules (800 mg total) by mouth 2 (two) times daily for 5 days.  Dispense: 40 capsule; Refill: 0    Continued to manage symptoms with over the counter medications as discussed    Follow Up Instructions: I discussed the assessment and treatment plan with the patient. The patient was provided an opportunity to ask questions and all were answered. The patient agreed with the plan and demonstrated an understanding of the instructions.  A copy of instructions were sent to the patient via MyChart unless otherwise noted below.    The patient was advised to call back or seek an in-person evaluation if the symptoms worsen or if the condition fails to improve as anticipated.  Time:  I spent 10 minutes with the patient via telehealth technology discussing the above problems/concerns.    Apolonio Schneiders, FNP

## 2022-12-31 ENCOUNTER — Encounter: Payer: Self-pay | Admitting: Rheumatology

## 2022-12-31 NOTE — Telephone Encounter (Signed)
I called patient, [facility] 12/02/2022, patient is going to f[facility] with [facility]

## 2023-01-28 DIAGNOSIS — Z6822 Body mass index (BMI) 22.0-22.9, adult: Secondary | ICD-10-CM | POA: Diagnosis not present

## 2023-01-28 DIAGNOSIS — H66003 Acute suppurative otitis media without spontaneous rupture of ear drum, bilateral: Secondary | ICD-10-CM | POA: Diagnosis not present

## 2023-02-15 ENCOUNTER — Other Ambulatory Visit (HOSPITAL_COMMUNITY): Payer: Self-pay

## 2023-02-15 ENCOUNTER — Other Ambulatory Visit: Payer: Self-pay

## 2023-02-15 ENCOUNTER — Other Ambulatory Visit: Payer: Self-pay | Admitting: Rheumatology

## 2023-02-15 DIAGNOSIS — L4050 Arthropathic psoriasis, unspecified: Secondary | ICD-10-CM

## 2023-02-15 MED ORDER — SIMPONI 50 MG/0.5ML ~~LOC~~ SOAJ
SUBCUTANEOUS | 2 refills | Status: DC
  Filled 2023-02-15 (×2): qty 0.5, 28d supply, fill #0
  Filled 2023-04-05: qty 0.5, 28d supply, fill #1
  Filled 2023-04-21: qty 0.5, 28d supply, fill #2

## 2023-02-15 NOTE — Telephone Encounter (Signed)
Patient called stating her Simponi medication was sent to [facility] and she needs it to be sent to [facility].

## 2023-02-15 NOTE — Telephone Encounter (Signed)
Next Visit: 05/02/2023   Last Visit: 12/02/2022   Last Fill: 12/02/2022  DX: Psoriatic arthritis   Current Dose per office note 12/02/2022: Simponi 50 mg subcutaneous injections every 28 days   Labs: 12/02/2022 CBC, CMP, sed rate are within normal limits.   TB Gold: 12/02/2022 Neg   Okay to refill Simponi?

## 2023-03-08 ENCOUNTER — Other Ambulatory Visit: Payer: Self-pay

## 2023-03-11 ENCOUNTER — Other Ambulatory Visit: Payer: Self-pay

## 2023-03-14 ENCOUNTER — Other Ambulatory Visit (HOSPITAL_COMMUNITY): Payer: Self-pay

## 2023-03-14 ENCOUNTER — Encounter: Payer: Self-pay | Admitting: *Deleted

## 2023-04-04 ENCOUNTER — Other Ambulatory Visit: Payer: Self-pay | Admitting: Obstetrics and Gynecology

## 2023-04-04 ENCOUNTER — Telehealth: Payer: Self-pay

## 2023-04-04 DIAGNOSIS — Z308 Encounter for other contraceptive management: Secondary | ICD-10-CM

## 2023-04-04 NOTE — Telephone Encounter (Signed)
Please move up the patient's annual exam appointment with me first.  How about 04/11/23?  I do not see an updated mammogram in Epic.  Her last documented mammogram is 2021.  Please have her schedule this.   I will be happy to be sure she gets her IUD exchange done also.  I can place future orders.

## 2023-04-04 NOTE — Telephone Encounter (Signed)
Last AEX 04/14/2022 AEX scheduled 07/22/2023  Transferred to triage voicemail from front desk. Patient would like to have appointment asap to have IUD exchange. Her Mirena IUD is due to be replaced soon. It was inserted by Dr. Hyacinth Meeker 05/30/2015.  Patient said she has started to experience a lot of BTB and feels it needs to be replaced.

## 2023-04-04 NOTE — Telephone Encounter (Signed)
Just to clarify.  The AEX will be one appointment and will she need another appointment to have IUD exchange or that will be at AEX appt?

## 2023-04-05 ENCOUNTER — Other Ambulatory Visit: Payer: Self-pay

## 2023-04-05 NOTE — Telephone Encounter (Signed)
Message sent to appointment desk.  ?

## 2023-04-05 NOTE — Telephone Encounter (Signed)
Annual exam would be one appointment and then IUD exchange another appointment.

## 2023-04-08 NOTE — Telephone Encounter (Signed)
FYI. Looks like at the moment. The pt is scheduled for her IUD replacement on 05/11/2023 and AEX for 07/20/2023.   Please confirm if you wanted AEX first ideally? Last AEX was 5/17 so will have to be after. Can forward to [facility] to help w/ schedule conflict if needed. Thanks.

## 2023-04-11 NOTE — Telephone Encounter (Signed)
FYI. Pt advised per BS to have screening mammo done prior to IUD replacement and AEX ideally. 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  Pt transferred to clinical supervisor to assist on scheduling.

## 2023-04-11 NOTE — Telephone Encounter (Signed)
Patient is overdue for her mammogram, which will need to be updated before I can place a new IUD.   Ideally she would have her annual exam completed with me before the IUD exchange.

## 2023-04-11 NOTE — Telephone Encounter (Signed)
Spoke with patient.  Patient expressed limited availability due to school and travel. Patient appreciative of assistance with scheduling.   Last AEX 04/14/22 AEX scheduled for 05/11/23 at 0830 With Dr. Edward Jolly IUD exchange on 05/12/23 at 1200 with Dr. Edward Jolly.   Patient is working on scheduling MMG, is aware this will need to be completed prior to appt. She is contacting [facility] and [facility] to see what scheduling options she has. She is aware to contact the office if she needs assistance with this. Patient verbalizes understanding and is agreeable.

## 2023-04-18 NOTE — Progress Notes (Signed)
Office Visit Note  Patient: Samantha Graves             Date of Birth: 1971/05/11           MRN: [id number]             PCP: Pricilla Riffle, MD Referring: Pricilla Riffle, MD Visit Date: 05/02/2023 Occupation: @GUAROCC @  Subjective:  Medication monitoring   History of Present Illness: Samantha Graves is a 52 y.o. female with history of psoriatic arthritis.  Patient remains on Simponi 50 mg subcutaneous injections every 28 days.  Patient reports that she administered her dose of simponi today.  She states that she had a gap in therapy earlier this spring while recovering from an upper respiratory tract infection.  She denies any recurrent infections.  Patient states that she currently has patches of psoriasis on her scalp, and both ears, on the lateral aspect of her right knee, lower back, and both elbows.  She has not been using any topical agents recently.  She denies any SI joint pain at this time.  She denies any joint swelling.  She denies any Achilles tendinitis or plantar fasciitis. She denies any new medical conditions.      Activities of Daily Living:  Patient reports morning stiffness for several hours.   Patient Reports nocturnal pain.  Difficulty dressing/grooming: Denies Difficulty climbing stairs: Denies Difficulty getting out of chair: Reports Difficulty using hands for taps, buttons, cutlery, and/or writing: Denies  Review of Systems  Constitutional:  Negative for fatigue.  HENT:  Negative for mouth sores, mouth dryness and nose dryness.   Eyes:  Negative for pain, visual disturbance and dryness.  Respiratory:  Negative for cough, hemoptysis, shortness of breath and difficulty breathing.   Cardiovascular:  Negative for chest pain, palpitations, hypertension and swelling in legs/feet.  Gastrointestinal:  Negative for blood in stool, constipation and diarrhea.  Endocrine: Negative for increased urination.  Genitourinary:  Negative for painful urination.   Musculoskeletal:  Positive for joint pain, joint pain, myalgias, muscle weakness, morning stiffness, muscle tenderness and myalgias. Negative for joint swelling.  Skin:  Negative for color change, pallor, rash, hair loss, nodules/bumps, skin tightness, ulcers and sensitivity to sunlight.  Allergic/Immunologic: Positive for susceptible to infections.  Neurological:  Negative for dizziness, numbness, headaches and weakness.  Hematological:  Negative for swollen glands.  Psychiatric/Behavioral:  Positive for sleep disturbance. Negative for depressed mood. The patient is not nervous/anxious.     PMFS History:  Patient Active Problem List   Diagnosis Date Noted   Leg length discrepancy 09/16/2022   Tear of left acetabular labrum 09/16/2022   Psoriasis 01/11/2017   High risk medication use 01/11/2017   PSORIATIC ARTHRITIS 10/14/2009   ALLERGIC RHINITIS 09/06/2007    Past Medical History:  Diagnosis Date   ADHD    Bile duct injury 2020   mild   Cholecystitis    Elevated LFTs    MGUS (monoclonal gammopathy of unknown significance) 2021   PONV (postoperative nausea and vomiting)    Psoriatic arthritis (HCC)    Rib fracture     Family History  Problem Relation Age of Onset   Hyperlipidemia Mother    Ovarian cysts Mother        benign   Endometriosis Mother    Liver disease Mother        NASH   Other Father        auto imune   Coronary artery disease Father  cardio ablation   COPD Maternal Grandmother    Bone cancer Maternal Grandfather 24   Arthritis/Rheumatoid Sister 38   Endometriosis Sister    Retinitis pigmentosa Sister 29   Ulcerative colitis Sister 21       surgical repair, J pouch   Ovarian cancer Sister 50       negative genetic testing   Liver disease Maternal Aunt        NASH   Colon cancer Neg Hx    Esophageal cancer Neg Hx    Pancreatic cancer Neg Hx    Stomach cancer Neg Hx    Past Surgical History:  Procedure Laterality Date   CHOLECYSTECTOMY  N/A 08/13/2019   Procedure: LAPAROSCOPIC CHOLECYSTECTOMY;  Surgeon: Emelia Loron, MD;  Location: [facility] OR;  Service: General;  Laterality: N/A;   INTRAUTERINE DEVICE INSERTION  05/30/15, 05/06/10   MIrena IUD   TONSILLECTOMY AND ADENOIDECTOMY  1990   WISDOM TOOTH EXTRACTION Bilateral 1991   Social History   Social History Narrative   Not on file   Immunization History  Administered Date(s) Administered   Influenza Whole 08/30/2003   Influenza,inj,Quad PF,6+ Mos 09/01/2015, 09/14/2019, 11/14/2020   PFIZER(Purple Top)SARS-COV-2 Vaccination 02/09/2020, 03/03/2020, 09/02/2020   Tdap 02/25/2011, 02/17/2021     Objective: Vital Signs: BP 122/86 (BP Location: Left Arm, Patient Position: Sitting, Cuff Size: Normal)   Pulse 64   Resp 14   Ht 5\' 7"  (1.702 m)   Wt 155 lb 9.6 oz (70.6 kg)   BMI 24.37 kg/m    Physical Exam Vitals and nursing note reviewed.  Constitutional:      Appearance: She is well-developed.  HENT:     Head: Normocephalic and atraumatic.  Eyes:     Conjunctiva/sclera: Conjunctivae normal.  Cardiovascular:     Rate and Rhythm: Normal rate and regular rhythm.     Heart sounds: Normal heart sounds.  Pulmonary:     Effort: Pulmonary effort is normal.     Breath sounds: Normal breath sounds.  Abdominal:     General: Bowel sounds are normal.     Palpations: Abdomen is soft.  Musculoskeletal:     Cervical back: Normal range of motion.  Skin:    General: Skin is warm and dry.     Capillary Refill: Capillary refill takes less than 2 seconds.     Comments: Small scattered patches of psoriasis on her scalp, ear canals, extensor surface of elbows, and lateral aspect of right knee noted.   Neurological:     Mental Status: She is alert and oriented to person, place, and time.  Psychiatric:        Behavior: Behavior normal.      Musculoskeletal Exam: C-spine, thoracic spine, lumbar spine have good range of motion.  No SI joint tenderness.  No midline spinal  tenderness.  Shoulder joints, elbow joints, wrist joints, MCPs, PIPs, DIPs have good range of motion with no synovitis.  Complete fist formation bilaterally.  Hip joints have good range of motion with no groin pain.  Knee joints have good range of motion with no warmth or effusion.  Ankle joints have good range of motion with no tenderness or joint swelling.  CDAI Exam: CDAI Score: -- Patient Global: --; Provider Global: -- Swollen: --; Tender: -- Joint Exam 05/02/2023   No joint exam has been documented for this visit   There is currently no information documented on the homunculus. Go to the Rheumatology activity and complete the homunculus joint exam.  Investigation:  No additional findings.  Imaging: No results found.  Recent Labs: Lab Results  Component Value Date   WBC 6.3 12/02/2022   HGB 12.9 12/02/2022   PLT 185 12/02/2022   NA 142 12/02/2022   K 4.3 12/02/2022   CL 107 12/02/2022   CO2 29 12/02/2022   GLUCOSE 97 12/02/2022   BUN 22 12/02/2022   CREATININE 0.83 12/02/2022   BILITOT 0.4 12/02/2022   ALKPHOS 41 03/17/2021   AST 18 12/02/2022   ALT 13 12/02/2022   PROT 6.2 12/02/2022   ALBUMIN 4.2 03/17/2021   CALCIUM 8.8 12/02/2022   GFRAA 107 03/16/2021   QFTBGOLD NEGATIVE 11/04/2017   QFTBGOLDPLUS NEGATIVE 12/02/2022    Speciality Comments: Enbrel, Humira-inadequate response Simponi-04/22 Leflunomide-patient did not to start as she was concerned about the side effects of elevated LFTs.  Procedures:  No procedures performed Allergies: Tamiflu [oseltamivir phosphate]   Assessment / Plan:     Visit Diagnoses: Psoriatic arthritis (HCC): No synovitis or dactylitis noted on examination today.  No evidence of Achilles tendinitis or plantar fasciitis.  No SI joint tenderness upon palpation.  She is currently having a flare of psoriasis and has a few small patches on her scalp, both ears, extensor surface of both elbows, and the lateral aspect of her right knee.   She has not been using a topical agents recently.  Patient declined a prescription for clobetasol cream at this time.  She has not been taking any over-the-counter products for pain relief or using any topical pain relieving agents recently.  She is currently on Simponi 50 mg subcutaneous injections every 28 days.  Her most recent dose of Simponi was administered today.  She had a gap in therapy earlier this spring while recovering from an upper respiratory tract infection.  She is apprehensive to make any medication changes at this time.  Discussed combination therapy versus switching to a different biologic for better management of active psoriasis.  She would like to hold off at this time.  She will follow-up in the office in 3 months or sooner if needed.  Psoriasis: She is currently having a flare of psoriasis and has small scattered patches on her scalp, ears, extensor surface of both elbows, lateral aspect of the right knee, and in the lower back buttocks region.  She has not been using any topical agents and declined a prescription to be sent to the pharmacy today.  She will remain on Simponi as prescribed.  She was advised to notify us if these patches persist or worsen.  High risk medication use - Simponi 50 mg subcutaneous injections every 28 days.  Patient declined leflunomide in the past--concern for risk of elevated LFTs. Previous therapy: Enbrel and Humira.  CBC and CMP updated on 12/02/22. Orders for CBC and CMP released today.  Her next lab work will be due in September and every 3 months.  Standing orders for CBC and CMP replaced today. TB gold negative on 12/02/22.  No recent or recurrent infections.  Discussed the importance of holding simponi if she develops signs or symptoms of an infection and to resume once the infection has completely cleared.   - Plan: COMPLETE METABOLIC PANEL WITH GFR, CBC with Differential/Platelet, CBC with Differential/Platelet, COMPLETE METABOLIC PANEL WITH  GFR  Pain in both hands - X-rays of bilateral hands are unchanged when compared to 2019 x-rays.  No synovitis or dactylitis noted on examination today.   Chronic SI joint pain: No SI joint tenderness upon palpation today.  Pain in left hip - Evaluated by Dr. Jordan Likes.  Found to have a small tear of her labrum per patient.  She had ultrasound-guided left hip joint injection which helped to alleviate her discomfort.  She has been performing stretching exercises daily.  She has been running for exercise.  Trochanteric bursitis of both hips: No tenderness over the trochanteric bursa at this time.  Patient was evaluated by a sports medicine provider in October 2023 at which time she was found to have a small tear of her labrum on the left side as well as trochanteric bursitis.  She has been performing stretching exercises on a daily basis which have been helpful.  She has been able to run for exercise without difficulty.  Pain in both feet - X-rays of bilateral feet showed osteoarthritic changes. A cyst or erosion was noted in the left fifth metatarsal head.  Wearing proper fitting shoes.  No evidence of Achilles tendinitis or plantar fasciitis.  Plantar fasciitis: Not currently symptomatic.  Chronic left-sided low back pain with left-sided sciatica: No midline spinal tenderness or SI joint tenderness noted today.  She takes methocarbamol 1 tablet daily as needed for muscle spasms.  She requested a refill to be sent to the pharmacy today.  Monoclonal gammopathy - She is followed by Dr. Leonides Schanz.  Orders: Orders Placed This Encounter  Procedures   COMPLETE METABOLIC PANEL WITH GFR   CBC with Differential/Platelet   CBC with Differential/Platelet   COMPLETE METABOLIC PANEL WITH GFR   Meds ordered this encounter  Medications   methocarbamol (ROBAXIN) 500 MG tablet    Sig: Take 1 tablet (500 mg total) by mouth daily as needed for muscle spasms.    Dispense:  14 tablet    Refill:  0     Follow-Up Instructions: Return in about 3 months (around 08/02/2023) for Psoriatic arthritis.   Gearldine Bienenstock, PA-C  Note - This record has been created using Dragon software.  Chart creation errors have been sought, but may not always  have been located. Such creation errors do not reflect on  the standard of medical care.

## 2023-04-21 ENCOUNTER — Other Ambulatory Visit (HOSPITAL_COMMUNITY): Payer: Self-pay

## 2023-04-22 ENCOUNTER — Other Ambulatory Visit (HOSPITAL_BASED_OUTPATIENT_CLINIC_OR_DEPARTMENT_OTHER): Payer: Self-pay

## 2023-04-22 DIAGNOSIS — Z1231 Encounter for screening mammogram for malignant neoplasm of breast: Secondary | ICD-10-CM

## 2023-04-23 ENCOUNTER — Inpatient Hospital Stay (HOSPITAL_BASED_OUTPATIENT_CLINIC_OR_DEPARTMENT_OTHER): Admission: RE | Admit: 2023-04-23 | Payer: BC Managed Care – PPO | Source: Ambulatory Visit | Admitting: Radiology

## 2023-04-23 DIAGNOSIS — Z1231 Encounter for screening mammogram for malignant neoplasm of breast: Secondary | ICD-10-CM

## 2023-04-26 ENCOUNTER — Other Ambulatory Visit (HOSPITAL_COMMUNITY): Payer: Self-pay

## 2023-04-27 NOTE — Progress Notes (Signed)
52 y.o. [id number] Married Caucasian female here for annual exam.    Has appointment for IUD exchange tomorrow.  Noticing some changes with bloating, ovulation pain, spotting, and bilateral breast tenderness.   Has decreased libido.  Asking about treatment options.   Taking NCLEX this week.  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  PCP:   Dr. Dietrich Pates.   Patient's last menstrual period was 04/30/2023.     Period Cycle (Days): 28 Period Duration (Days): 2-3 Period Pattern: Regular Menstrual Flow: Light Menstrual Control: Panty liner     Sexually active: Yes.    The current method of family planning is IUD--Mirena 05/30/15.    Exercising: Yes.     running Smoker:  no  Health Maintenance: Pap:  02/05/20 neg: HR HPV neg History of abnormal Pap:  no MMG:  02/26/20 Breast Density Cat C, BI-RADS CAT 3 probably benign.  Had  left axillary lymph node 6 mm likely reactive and due to her recent COVID vaccine.   Follow up left axillary [place] 09/26/20 showed lymph node decreased in size.  Has appointment tomorrow.  Colonoscopy:  05/06/22 - negative BMD:   n/a  Result  n/a TDaP:  02/17/21 Gardasil:   no HIV: 04/15/20 NR Hep C: 06/29/19 NR Screening Labs:  Cardiology.    reports that she has never smoked. She has been exposed to tobacco smoke. She has never used smokeless tobacco. She reports current alcohol use of about 3.0 - 4.0 standard drinks of alcohol per week. She reports that she does not use drugs.  Past Medical History:  Diagnosis Date   ADHD    Bile duct injury 2020   mild   Cholecystitis    Elevated LFTs    MGUS (monoclonal gammopathy of unknown significance) 2021   PONV (postoperative nausea and vomiting)    Psoriatic arthritis (HCC)    Rib fracture     Past Surgical History:  Procedure Laterality Date   CHOLECYSTECTOMY N/A 08/13/2019   Procedure: LAPAROSCOPIC CHOLECYSTECTOMY;  Surgeon: Emelia Loron, MD;  Location: [facility] OR;  Service: General;  Laterality:  N/A;   INTRAUTERINE DEVICE INSERTION  05/30/15, 05/06/10   MIrena IUD   TONSILLECTOMY AND ADENOIDECTOMY  1990   WISDOM TOOTH EXTRACTION Bilateral 1991    Current Outpatient Medications  Medication Sig Dispense Refill   amphetamine-dextroamphetamine (ADDERALL) 10 MG tablet 1 TABLET BY MOUTH ONCE A DAY AS NEEDED DEPENDING UPON THE DEMANDS OF THE AFTERNOON     Cholecalciferol (VITAMIN D3) 125 MCG (5000 UT) CAPS Take 1 capsule by mouth daily.     EVEKEO 10 MG TABS Take 10 mg by mouth as needed.  0   Golimumab (SIMPONI) 50 MG/0.5ML SOAJ INJECT 50 MG INTO THE SKIN EVERY 28 DAYS. 0.5 mL 2   levonorgestrel (MIRENA) 20 MCG/24HR IUD 1 each by Intrauterine route once. Mirena IUD.     methocarbamol (ROBAXIN) 500 MG tablet Take 1 tablet (500 mg total) by mouth daily as needed for muscle spasms. 14 tablet 0   VITAMIN D PO Take 2,500 Units by mouth 2 (two) times a week.     No current facility-administered medications for this visit.    Family History  Problem Relation Age of Onset   Hyperlipidemia Mother    Ovarian cysts Mother        benign   Endometriosis Mother    Liver disease Mother        NASH   Other Father  auto imune   Coronary artery disease Father        cardio ablation   COPD Maternal Grandmother    Bone cancer Maternal Grandfather 79   Arthritis/Rheumatoid Sister 45   Endometriosis Sister    Retinitis pigmentosa Sister 1   Ulcerative colitis Sister 21       surgical repair, J pouch   Ovarian cancer Sister 19       negative genetic testing   Liver disease Maternal Aunt        NASH   Colon cancer Neg Hx    Esophageal cancer Neg Hx    Pancreatic cancer Neg Hx    Stomach cancer Neg Hx     Review of Systems  All other systems reviewed and are negative.   Exam:   BP 118/72 (BP Location: Left Arm, Patient Position: Sitting, Cuff Size: Normal)   Pulse 62   Ht 5\' 9"  (1.753 m)   Wt 153 lb (69.4 kg)   LMP 04/30/2023   SpO2 99%   BMI 22.59 kg/m     General  appearance: alert, cooperative and appears stated age Head: normocephalic, without obvious abnormality, atraumatic Neck: no adenopathy, supple, symmetrical, trachea midline and thyroid normal to inspection and palpation Lungs: clear to auscultation bilaterally Breasts: normal appearance, no masses or tenderness, No nipple retraction or dimpling, No nipple discharge or bleeding, No axillary adenopathy Heart: regular rate and rhythm Abdomen: soft, non-tender; no masses, no organomegaly Extremities: extremities normal, atraumatic, no cyanosis or edema Skin: skin color, texture, turgor normal. No rashes or lesions Lymph nodes: cervical, supraclavicular, and axillary nodes normal. Neurologic: grossly normal  Pelvic: External genitalia:  no lesions              No abnormal inguinal nodes palpated.              Urethra:  normal appearing urethra with no masses, tenderness or lesions              Bartholins and Skenes: normal                 Vagina: normal appearing vagina with normal color and discharge, no lesions              Cervix: no lesions.  IUD strings noted.              Pap taken: no Bimanual Exam:  Uterus:  normal size, contour, position, consistency, mobility, non-tender              Adnexa: no mass, fullness, tenderness              Rectal exam: yes.  Confirms.              Anus:  normal sphincter tone, no lesions  Chaperone was present for exam:  Antony Salmon, CMA  Assessment:   Well woman visit with gynecologic exam. FH ovarian cancer in sister.  Sister had negative genetic testing.  Patient had US showing normal ovaries in 2023.  Mirena IUD.  IUD expires 05/2023. MGUS.  Decreased libido.   Plan: Mammogram screening discussed. Self breast awareness reviewed. Pap and HR HPV as above. Guidelines for Calcium, Vitamin D, regular exercise program including cardiovascular and weight bearing exercise. Pelvic US every 2 - 3 years and as needed.  She will return for Mirena IUD  exchange tomorrow.  Motrin 800 mg 1 hr prior to appt.  We discussed testosterone therapy or Wellbutrin for treating decreased libido.  No  Rx given today.  Follow up annually and prn.   After visit summary provided.

## 2023-04-28 NOTE — Progress Notes (Signed)
GYNECOLOGY  VISIT   HPI: 52 y.o.   Married  Caucasian  female   [phone] with Patient's last menstrual period was 04/30/2023.   here for   Mirena exchange.  UPT negative.   GYNECOLOGIC HISTORY: Patient's last menstrual period was 04/30/2023. Contraception:  IUD 05/30/15 Menopausal hormone therapy:  n/a Last mammogram:  09-26-20 Lt.Br.US//decrease in size of lymph nodes;screening 01/2021., 02/26/20 Breast Density Cat C, BI-RADS CAT 3 probably benign Last pap smear 02-05-20 Neg:Neg HR HPV, 07-09-15 Neg:Neg HR HPV         OB History     Gravida  4   Para  4   Term  4   Preterm  0   AB  0   Living  4      SAB  0   IAB  0   Ectopic  0   Multiple  0   Live Births  4              Patient Active Problem List   Diagnosis Date Noted   Leg length discrepancy 09/16/2022   Tear of left acetabular labrum 09/16/2022   Psoriasis 01/11/2017   High risk medication use 01/11/2017   PSORIATIC ARTHRITIS 10/14/2009   ALLERGIC RHINITIS 09/06/2007    Past Medical History:  Diagnosis Date   ADHD    Bile duct injury 2020   mild   Cholecystitis    Elevated LFTs    MGUS (monoclonal gammopathy of unknown significance) 2021   PONV (postoperative nausea and vomiting)    Psoriatic arthritis (HCC)    Rib fracture     Past Surgical History:  Procedure Laterality Date   CHOLECYSTECTOMY N/A 08/13/2019   Procedure: LAPAROSCOPIC CHOLECYSTECTOMY;  Surgeon: Emelia Loron, MD;  Location: [facility] OR;  Service: General;  Laterality: N/A;   INTRAUTERINE DEVICE INSERTION  05/30/15, 05/06/10   MIrena IUD   TONSILLECTOMY AND ADENOIDECTOMY  1990   WISDOM TOOTH EXTRACTION Bilateral 1991    Current Outpatient Medications  Medication Sig Dispense Refill   amphetamine-dextroamphetamine (ADDERALL) 10 MG tablet 1 TABLET BY MOUTH ONCE A DAY AS NEEDED DEPENDING UPON THE DEMANDS OF THE AFTERNOON     Cholecalciferol (VITAMIN D3) 125 MCG (5000 UT) CAPS Take 1 capsule by mouth daily.     EVEKEO 10 MG TABS  Take 10 mg by mouth as needed.  0   Golimumab (SIMPONI) 50 MG/0.5ML SOAJ INJECT 50 MG INTO THE SKIN EVERY 28 DAYS. 0.5 mL 2   levonorgestrel (MIRENA) 20 MCG/24HR IUD 1 each by Intrauterine route once. Mirena IUD.     methocarbamol (ROBAXIN) 500 MG tablet Take 1 tablet (500 mg total) by mouth daily as needed for muscle spasms. 14 tablet 0   VITAMIN D PO Take 2,500 Units by mouth 2 (two) times a week.     No current facility-administered medications for this visit.     ALLERGIES: Tamiflu [oseltamivir phosphate]  Family History  Problem Relation Age of Onset   Hyperlipidemia Mother    Ovarian cysts Mother        benign   Endometriosis Mother    Liver disease Mother        NASH   Other Father        auto imune   Coronary artery disease Father        cardio ablation   COPD Maternal Grandmother    Bone cancer Maternal Grandfather 61   Arthritis/Rheumatoid Sister 67   Endometriosis Sister    Retinitis pigmentosa Sister  18   Ulcerative colitis Sister 21       surgical repair, J pouch   Ovarian cancer Sister 20       negative genetic testing   Liver disease Maternal Aunt        NASH   Colon cancer Neg Hx    Esophageal cancer Neg Hx    Pancreatic cancer Neg Hx    Stomach cancer Neg Hx     Social History   Socioeconomic History   Marital status: Married    Spouse name: Not on file   Number of children: Not on file   Years of education: Not on file   Highest education level: Not on file  Occupational History   Not on file  Tobacco Use   Smoking status: Never    Passive exposure: Past   Smokeless tobacco: Never  Vaping Use   Vaping Use: Never used  Substance and Sexual Activity   Alcohol use: Yes    Alcohol/week: 3.0 - 4.0 standard drinks of alcohol    Types: 3 - 4 Glasses of wine per week    Comment: occ   Drug use: No   Sexual activity: Yes    Birth control/protection: I.U.D.    Comment: Mirena 05-30-15  Other Topics Concern   Not on file  Social History  Narrative   Not on file   Social Determinants of Health   Financial Resource Strain: Not on file  Food Insecurity: Not on file  Transportation Needs: Not on file  Physical Activity: Not on file  Stress: Not on file  Social Connections: Not on file  Intimate Partner Violence: Not on file    Review of Systems  All other systems reviewed and are negative.   PHYSICAL EXAMINATION:    BP 126/84 (BP Location: Left Arm, Patient Position: Sitting, Cuff Size: Normal)   Pulse 73   Ht 5\' 9"  (1.753 m)   Wt 153 lb (69.4 kg)   LMP 04/30/2023   SpO2 97%   BMI 22.59 kg/m     General appearance: alert, cooperative and appears stated age   Pelvic: External genitalia:  no lesions              Urethra:  normal appearing urethra with no masses, tenderness or lesions              Bartholins and Skenes: normal                 Vagina: normal appearing vagina with normal color and discharge, no lesions              Cervix: no lesions                Bimanual Exam:  Uterus:  normal size, contour, position, consistency, mobility, non-tender              Adnexa: no mass, fullness, tenderness    Consent for  Mirena IUD removal and re-insertion.  Lot  [id number], expiration  May 2026. Speculum placed in vagina.  Sterile prep of cervix with Hibiclens.  Paracervical block with 10 cc 1% lidocaine - lot  [id number], expiration   05/2025. Ring forceps used to remove IUD intact, declined to be seen by patient, and discarded. Tenaculum to anterior cervical lip.  Uterus sounded to  7.5 cm.  Slightly retroverted uterus. Tenaculum switched to posterior cervical lip as slight resistance felt when trying to place IUD with tenaculum on anterior cervical lip. Mirena IUD placed without  difficulty.  Strings trimmed and declined to be seen by patient.  Repeat bimanual exam, no change. No complications.  Minimal EBL.  Chaperone was present for exam:  Warren Lacy, CMA  ASSESSMENT  Mirena IUD removal and reinsertion.    PLAN  Instructions and precautions given.  Back up contraception for 2 weeks. Motrin and heating pad for cramping prn.  New IUD card to patient.  Follow up for a recheck in 6 weeks, sooner as needed.  After visit summary to patient.

## 2023-05-02 ENCOUNTER — Ambulatory Visit: Payer: BC Managed Care – PPO | Admitting: Obstetrics and Gynecology

## 2023-05-02 ENCOUNTER — Ambulatory Visit: Payer: BC Managed Care – PPO | Attending: Physician Assistant | Admitting: Physician Assistant

## 2023-05-02 ENCOUNTER — Encounter: Payer: Self-pay | Admitting: Physician Assistant

## 2023-05-02 VITALS — BP 122/86 | HR 64 | Resp 14 | Ht 67.0 in | Wt 155.6 lb

## 2023-05-02 DIAGNOSIS — M722 Plantar fascial fibromatosis: Secondary | ICD-10-CM

## 2023-05-02 DIAGNOSIS — M25552 Pain in left hip: Secondary | ICD-10-CM

## 2023-05-02 DIAGNOSIS — M533 Sacrococcygeal disorders, not elsewhere classified: Secondary | ICD-10-CM

## 2023-05-02 DIAGNOSIS — L409 Psoriasis, unspecified: Secondary | ICD-10-CM | POA: Diagnosis not present

## 2023-05-02 DIAGNOSIS — M5442 Lumbago with sciatica, left side: Secondary | ICD-10-CM

## 2023-05-02 DIAGNOSIS — M7061 Trochanteric bursitis, right hip: Secondary | ICD-10-CM

## 2023-05-02 DIAGNOSIS — M79671 Pain in right foot: Secondary | ICD-10-CM

## 2023-05-02 DIAGNOSIS — L4050 Arthropathic psoriasis, unspecified: Secondary | ICD-10-CM

## 2023-05-02 DIAGNOSIS — Z79899 Other long term (current) drug therapy: Secondary | ICD-10-CM | POA: Diagnosis not present

## 2023-05-02 DIAGNOSIS — M79672 Pain in left foot: Secondary | ICD-10-CM

## 2023-05-02 DIAGNOSIS — D472 Monoclonal gammopathy: Secondary | ICD-10-CM

## 2023-05-02 DIAGNOSIS — M79641 Pain in right hand: Secondary | ICD-10-CM | POA: Diagnosis not present

## 2023-05-02 DIAGNOSIS — M7062 Trochanteric bursitis, left hip: Secondary | ICD-10-CM

## 2023-05-02 DIAGNOSIS — M79642 Pain in left hand: Secondary | ICD-10-CM

## 2023-05-02 DIAGNOSIS — G8929 Other chronic pain: Secondary | ICD-10-CM

## 2023-05-02 LAB — CBC WITH DIFFERENTIAL/PLATELET
Basophils Absolute: 42 cells/uL (ref 0–200)
Eosinophils Absolute: 98 cells/uL (ref 15–500)
Eosinophils Relative: 1.4 %
MCV: 95.7 fL (ref 80.0–100.0)
Monocytes Relative: 5.9 %
Neutrophils Relative %: 67.7 %
Platelets: 203 10*3/uL (ref 140–400)

## 2023-05-02 MED ORDER — METHOCARBAMOL 500 MG PO TABS: 500.0000 mg | ORAL_TABLET | Freq: Every day | ORAL | 0 refills | Status: DC | PRN

## 2023-05-02 NOTE — Patient Instructions (Signed)
Standing Labs We placed an order today for your standing lab work.   Please have your standing labs drawn in September and every 3 months   Please have your labs drawn 2 weeks prior to your appointment so that the provider can discuss your lab results at your appointment, if possible.  Please note that you may see your imaging and lab results in MyChart before we have reviewed them. We will contact you once all results are reviewed. Please allow our office up to 72 hours to thoroughly review all of the results before contacting the office for clarification of your results.  WALK-IN LAB HOURS  Monday through Thursday from 8:00 am -12:30 pm and 1:00 pm-5:00 pm and Friday from 8:00 am-12:00 pm.  Patients with office visits requiring labs will be seen before walk-in labs.  You may encounter longer than normal wait times. Please allow additional time. Wait times may be shorter on  Monday and Thursday afternoons.  We do not book appointments for walk-in labs. We appreciate your patience and understanding with our staff.   Labs are drawn by [facility]. Please bring your co-pay at the time of your lab draw.  You may receive a bill from [facility] for your lab work.  Please note if you are on Hydroxychloroquine and and an order has been placed for a Hydroxychloroquine level,  you will need to have it drawn 4 hours or more after your last dose.  If you wish to have your labs drawn at another location, please call the office 24 hours in advance so we can fax the orders.  The office is located at [address], [address], [place], [place] [address]   If you have any questions regarding directions or hours of operation,  please call [phone].   As a reminder, please drink plenty of water prior to coming for your lab work. Thanks!  

## 2023-05-03 LAB — CBC WITH DIFFERENTIAL/PLATELET
Absolute Monocytes: 413 cells/uL (ref 200–950)
Basophils Relative: 0.6 %
HCT: 41.9 % (ref 35.0–45.0)
Hemoglobin: 14.1 g/dL (ref 11.7–15.5)
Lymphs Abs: 1708 cells/uL (ref 850–3900)
MCH: 32.2 pg (ref 27.0–33.0)
MCHC: 33.7 g/dL (ref 32.0–36.0)
MPV: 10.8 fL (ref 7.5–12.5)
Neutro Abs: 4739 cells/uL (ref 1500–7800)
RBC: 4.38 10*6/uL (ref 3.80–5.10)
RDW: 11.4 % (ref 11.0–15.0)
Total Lymphocyte: 24.4 %
WBC: 7 10*3/uL (ref 3.8–10.8)

## 2023-05-03 LAB — COMPLETE METABOLIC PANEL WITH GFR
AG Ratio: 2.1 (calc) (ref 1.0–2.5)
ALT: 14 U/L (ref 6–29)
AST: 19 U/L (ref 10–35)
Albumin: 4.5 g/dL (ref 3.6–5.1)
Alkaline phosphatase (APISO): 42 U/L (ref 37–153)
BUN: 18 mg/dL (ref 7–25)
CO2: 25 mmol/L (ref 20–32)
Calcium: 9.5 mg/dL (ref 8.6–10.4)
Chloride: 101 mmol/L (ref 98–110)
Creat: 0.81 mg/dL (ref 0.50–1.03)
Globulin: 2.1 g/dL (calc) (ref 1.9–3.7)
Glucose, Bld: 94 mg/dL (ref 65–99)
Potassium: 4.8 mmol/L (ref 3.5–5.3)
Sodium: 137 mmol/L (ref 135–146)
Total Bilirubin: 0.7 mg/dL (ref 0.2–1.2)
Total Protein: 6.6 g/dL (ref 6.1–8.1)
eGFR: 88 mL/min/{1.73_m2} (ref >=60)

## 2023-05-03 NOTE — Progress Notes (Signed)
CBC and CMP WNL

## 2023-05-11 ENCOUNTER — Encounter: Payer: Self-pay | Admitting: Obstetrics and Gynecology

## 2023-05-11 ENCOUNTER — Ambulatory Visit (INDEPENDENT_AMBULATORY_CARE_PROVIDER_SITE_OTHER): Payer: BC Managed Care – PPO | Admitting: Obstetrics and Gynecology

## 2023-05-11 ENCOUNTER — Ambulatory Visit: Payer: BC Managed Care – PPO | Admitting: Obstetrics and Gynecology

## 2023-05-11 VITALS — BP 118/72 | HR 62 | Ht 69.0 in | Wt 153.0 lb

## 2023-05-11 DIAGNOSIS — Z01419 Encounter for gynecological examination (general) (routine) without abnormal findings: Secondary | ICD-10-CM

## 2023-05-11 NOTE — Patient Instructions (Signed)

## 2023-05-12 ENCOUNTER — Ambulatory Visit (INDEPENDENT_AMBULATORY_CARE_PROVIDER_SITE_OTHER): Payer: BC Managed Care – PPO | Admitting: Obstetrics and Gynecology

## 2023-05-12 ENCOUNTER — Ambulatory Visit (HOSPITAL_BASED_OUTPATIENT_CLINIC_OR_DEPARTMENT_OTHER)
Admission: RE | Admit: 2023-05-12 | Discharge: 2023-05-12 | Disposition: A | Discharge status: Home / Self Care | Payer: BC Managed Care – PPO | Source: Ambulatory Visit | Attending: Obstetrics and Gynecology | Admitting: Obstetrics and Gynecology

## 2023-05-12 ENCOUNTER — Encounter: Payer: Self-pay | Admitting: Obstetrics and Gynecology

## 2023-05-12 VITALS — BP 126/84 | HR 73 | Ht 69.0 in | Wt 153.0 lb

## 2023-05-12 DIAGNOSIS — Z30433 Encounter for removal and reinsertion of intrauterine contraceptive device: Secondary | ICD-10-CM | POA: Diagnosis not present

## 2023-05-12 DIAGNOSIS — Z1231 Encounter for screening mammogram for malignant neoplasm of breast: Secondary | ICD-10-CM | POA: Diagnosis not present

## 2023-05-12 DIAGNOSIS — Z01812 Encounter for preprocedural laboratory examination: Secondary | ICD-10-CM | POA: Diagnosis not present

## 2023-05-12 DIAGNOSIS — Z308 Encounter for other contraceptive management: Secondary | ICD-10-CM

## 2023-05-12 LAB — PREGNANCY, URINE: Preg Test, Ur: NEGATIVE

## 2023-05-12 NOTE — Patient Instructions (Signed)
Intrauterine Device Insertion An intrauterine device (IUD) is a medical device that is inserted into the uterus to prevent pregnancy. It is a small, T-shaped device that has one or two nylon strings hanging down from it. The strings hang out of the lower part of the uterus (cervix) to allow for future IUD removal. There are two types of IUDs: Hormone IUD. This type of IUD is made of plastic and contains the hormone progestin (synthetic progesterone). A hormone IUD may last 3-5 years, depending on which one you have. Synthetic progesterone prevents pregnancy by: Thickening cervical mucus to prevent sperm from entering the uterus. Thinning the uterine lining to prevent a fertilized egg from implanting there. Copper IUD. This type of IUD has copper wire wrapped around it. A copper IUD may last up to 10 years. Copper prevents pregnancy by making the uterus and fallopian tubes produce a fluid that kills sperm. Tell a health care provider about: Any allergies you have. All medicines you are taking, including vitamins, herbs, eye drops, creams, and over-the-counter medicines. Any surgeries you have had. Any medical conditions you have, including any sexually transmitted infections (STIs) you may have. Whether you are pregnant or may be pregnant. What are the risks? Generally, this is a safe procedure. However, problems may occur, including: Infection. Bleeding. Allergic reactions to medicines. Puncture (perforation) of the uterus or damage to other structures or organs. Accidental placement of the IUD either in the muscle layer of the uterus (myometrium) or outside the uterus. The IUD falling out of the uterus (expulsion). This is more common among women who have recently had a child. Higher risk of an egg being fertilized outside your uterus (ectopic pregnancy).This is rare. Pelvic inflammatory disease (PID), which is an infection in the uterus and fallopian tubes. The IUD does not cause the  infection. The infection is usually from an unknown sexually transmitted infection (STI). This is rare, and it usually happens during the first 20 days after the IUD is inserted. What happens before the procedure? Ask your health care provider about: Changing or stopping your regular medicines. This is especially important if you are taking diabetes medicines or blood thinners. Taking over-the-counter medicines, vitamins, herbs, and supplements. Talk with your health care provider about when to schedule your IUD placement. Your health care provider may recommend taking over-the-counter pain medicines before the procedure. These medicines include ibuprofen and naproxen. You may have tests for: Pregnancy. A pregnancy test involves having a urine or blood sample taken. Sexually transmitted infections (STIs). Placing an IUD in someone who has an STI can make the infection worse. Cervical cancer. You may have a Pap test to check for this type of cancer. This means collecting cells from your cervix to be checked under a microscope. You may have a physical exam to determine the size and position of your uterus. What happens during the procedure? A tool (speculum) will be placed in your vagina and widened so that your health care provider can see your cervix. Medicine, or antiseptic, may be applied to your cervix to help lower your risk of infection. You may be given an anesthetic medicine to numb each side of your cervix. This medicine is usually given by an injection into the cervix. A tool called a uterine sound will be inserted into your uterus to check the length of your uterus and the direction that your uterus may be tilted. A slim instrument or tube (IUD inserter) that holds the IUD will be inserted into your vagina,   through your cervical canal, and into your uterus. The IUD will be placed in the uterus, and the IUD inserter will be removed. The strings that are attached to the IUD will be trimmed  so that they lie just below the cervix. The speculum will be removed. The procedure may vary among health care providers and hospitals. What can I expect after procedure? You may have bleeding after the procedure. This is normal. It varies from light bleeding (spotting) for a few days to menstrual-like bleeding. You may have cramping and pain in the abdomen. You may feel dizzy or light-headed. You may have lower back pain. You may have headaches and nausea. Follow these instructions at home: Before resuming sexual activity, check to make sure that you can feel the IUD string or strings. You should be able to feel the end of the string below the opening of your cervix. If your IUD string is in place, you may resume sexual activity. If you had a hormonal IUD inserted more than 7 days after your most recent period started, you will need to use a backup method of birth control for 7 days after IUD insertion. Ask your health care provider whether this applies to you. Continue to check that the IUD is still in place by feeling for the strings after every menstrual period, or once a month. An IUD will not protect you from sexually transmitted infections (STIs). Use methods to prevent the exchange of body fluids between partners (barrier protection) every time you have sex. Barrier protection can be used during oral, vaginal, or anal sex. Commonly used barrier methods include: Female condom. Female condom. Dental dam. Take over-the-counter and prescription medicines only as told by your health care provider. Keep all follow-up visits. This is important. Contact a health care provider if: You feel light-headed or weak. You have any of the following problems with your IUD string or strings: The string bothers or hurts you or your sexual partner. You cannot feel the string. The string has gotten longer. You can feel the IUD in your vagina. You think you may be pregnant, or you miss your menstrual  period. You think you may have a sexually transmitted infection (STI). Get help right away if you: You have flu-like symptoms, such as tiredness (fatigue) and muscle aches. You have a fever and chills. You have bleeding that is heavier or lasts longer than a normal menstrual cycle. You have abnormal or bad-smelling discharge from your vagina. You develop abdominal pain that is new, is getting worse, or is not in the same area of earlier cramping and pain. You have pain during sexual activity. Summary An intrauterine device (IUD) is a small, T-shaped device that has one or two nylon strings hanging down from it. You may have a copper IUD or a hormone IUD. Ask your health care provider what you need to do before the procedure. You may have some tests and you may have to change or stop some medicines. You may have bleeding after the procedure. This is normal. It varies from light spotting for a few days to menstrual-like bleeding. Check to make sure that you can feel the IUD strings before you resume sexual activity. Check the strings after every menstrual period or once a month. An IUD does not protect against STIs. Use other methods to protect yourself against infections. This information is not intended to replace advice given to you by your health care provider. Make sure you discuss any questions you have with   your health care provider. Document Revised: 05/28/2020 Document Reviewed: 05/28/2020 Elsevier Patient Education  2024 [employer]  

## 2023-05-20 ENCOUNTER — Other Ambulatory Visit (HOSPITAL_COMMUNITY): Payer: Self-pay

## 2023-05-23 ENCOUNTER — Other Ambulatory Visit (HOSPITAL_COMMUNITY): Payer: Self-pay

## 2023-05-25 ENCOUNTER — Other Ambulatory Visit (HOSPITAL_COMMUNITY): Payer: Self-pay

## 2023-06-01 ENCOUNTER — Other Ambulatory Visit (HOSPITAL_COMMUNITY): Payer: Self-pay

## 2023-06-01 ENCOUNTER — Other Ambulatory Visit: Payer: Self-pay | Admitting: Physician Assistant

## 2023-06-01 ENCOUNTER — Other Ambulatory Visit: Payer: Self-pay

## 2023-06-01 DIAGNOSIS — L4050 Arthropathic psoriasis, unspecified: Secondary | ICD-10-CM

## 2023-06-01 MED ORDER — SIMPONI 50 MG/0.5ML ~~LOC~~ SOAJ
50.0000 mg | SUBCUTANEOUS | 2 refills | Status: DC
Start: 2023-06-01 — End: 2023-08-23
  Filled 2023-06-01: qty 0.5, 28d supply, fill #0
  Filled 2023-06-29: qty 0.5, 28d supply, fill #1
  Filled 2023-07-29: qty 0.5, 28d supply, fill #2

## 2023-06-01 NOTE — Telephone Encounter (Signed)
Last Fill: 02/15/2023  Labs: 05/02/2023 CBC and CMP WNL   TB Gold: 12/02/2022  TB Gold is negative.   Next Visit: 08/02/2023  Last Visit: 05/02/2023  JY:[id number] arthritis (   Current Dose per office note 05/02/2023: Simponi 50 mg subcutaneous injections every 28 days.   Okay to refill Simponi?

## 2023-06-03 ENCOUNTER — Other Ambulatory Visit (HOSPITAL_COMMUNITY): Payer: Self-pay

## 2023-06-06 ENCOUNTER — Other Ambulatory Visit (HOSPITAL_COMMUNITY): Payer: Self-pay

## 2023-06-06 ENCOUNTER — Other Ambulatory Visit: Payer: Self-pay

## 2023-06-16 NOTE — Progress Notes (Unsigned)
GYNECOLOGY  VISIT   HPI: 52 y.o.   Married  Caucasian  female   [phone] with No LMP recorded. (Menstrual status: IUD).   here for    6 week IUD f/u.  Bled for a few days after placement and some spotting 4 weeks after placement.  No pain.   Interested in testosterone treatment for low libido.   GYNECOLOGIC HISTORY: No LMP recorded. (Menstrual status: IUD). Contraception:  IUD--Mirena 05/12/23 Menopausal hormone therapy:  n/a Last mammogram:  09-26-20 Lt.Br.US//decrease in size of lymph nodes;screening 01/2021., 02/26/20 Breast Density Cat C, BI-RADS CAT 3 probably benign  Last pap smear:   02-05-20 Neg:Neg HR HPV, 07-09-15 Neg:Neg HR HPV         OB History     Gravida  4   Para  4   Term  4   Preterm  0   AB  0   Living  4      SAB  0   IAB  0   Ectopic  0   Multiple  0   Live Births  4              Patient Active Problem List   Diagnosis Date Noted   Leg length discrepancy 09/16/2022   Tear of left acetabular labrum 09/16/2022   Psoriasis 01/11/2017   High risk medication use 01/11/2017   PSORIATIC ARTHRITIS 10/14/2009   ALLERGIC RHINITIS 09/06/2007    Past Medical History:  Diagnosis Date   ADHD    Bile duct injury 2020   mild   Cholecystitis    Elevated LFTs    MGUS (monoclonal gammopathy of unknown significance) 2021   PONV (postoperative nausea and vomiting)    Psoriatic arthritis (HCC)    Rib fracture     Past Surgical History:  Procedure Laterality Date   CHOLECYSTECTOMY N/A 08/13/2019   Procedure: LAPAROSCOPIC CHOLECYSTECTOMY;  Surgeon: Emelia Loron, MD;  Location: [facility] OR;  Service: General;  Laterality: N/A;   INTRAUTERINE DEVICE INSERTION  05/30/15, 05/06/10   MIrena IUD   TONSILLECTOMY AND ADENOIDECTOMY  1990   WISDOM TOOTH EXTRACTION Bilateral 1991    Current Outpatient Medications  Medication Sig Dispense Refill   amphetamine-dextroamphetamine (ADDERALL) 10 MG tablet 1 TABLET BY MOUTH ONCE A DAY AS NEEDED DEPENDING UPON THE  DEMANDS OF THE AFTERNOON     Cholecalciferol (VITAMIN D3) 125 MCG (5000 UT) CAPS Take 1 capsule by mouth daily.     EVEKEO 10 MG TABS Take 10 mg by mouth as needed.  0   Golimumab (SIMPONI) 50 MG/0.5ML SOAJ Inject 50 mg into the skin every 28 (twenty-eight) days. 0.5 mL 2   levonorgestrel (MIRENA) 20 MCG/24HR IUD 1 each by Intrauterine route once. Mirena IUD.     methocarbamol (ROBAXIN) 500 MG tablet Take 1 tablet (500 mg total) by mouth daily as needed for muscle spasms. 14 tablet 0   VITAMIN D PO Take 2,500 Units by mouth 2 (two) times a week.     No current facility-administered medications for this visit.     ALLERGIES: Tamiflu [oseltamivir phosphate]  Family History  Problem Relation Age of Onset   Hyperlipidemia Mother    Ovarian cysts Mother        benign   Endometriosis Mother    Liver disease Mother        NASH   Other Father        auto imune   Coronary artery disease Father  cardio ablation   COPD Maternal Grandmother    Bone cancer Maternal Grandfather 62   Arthritis/Rheumatoid Sister 68   Endometriosis Sister    Retinitis pigmentosa Sister 49   Ulcerative colitis Sister 21       surgical repair, J pouch   Ovarian cancer Sister 42       negative genetic testing   Liver disease Maternal Aunt        NASH   Colon cancer Neg Hx    Esophageal cancer Neg Hx    Pancreatic cancer Neg Hx    Stomach cancer Neg Hx     Social History   Socioeconomic History   Marital status: Married    Spouse name: Not on file   Number of children: Not on file   Years of education: Not on file   Highest education level: Not on file  Occupational History   Not on file  Tobacco Use   Smoking status: Never    Passive exposure: Past   Smokeless tobacco: Never  Vaping Use   Vaping status: Never Used  Substance and Sexual Activity   Alcohol use: Yes    Alcohol/week: 3.0 - 4.0 standard drinks of alcohol    Types: 3 - 4 Glasses of wine per week    Comment: occ   Drug use:  No   Sexual activity: Yes    Birth control/protection: I.U.D.    Comment: Mirena 05-30-15  Other Topics Concern   Not on file  Social History Narrative   Not on file   Social Determinants of Health   Financial Resource Strain: Not on file  Food Insecurity: Not on file  Transportation Needs: Not on file  Physical Activity: Not on file  Stress: Not on file  Social Connections: Not on file  Intimate Partner Violence: Not on file    Review of Systems  All other systems reviewed and are negative.   PHYSICAL EXAMINATION:    BP 126/80 (BP Location: Left Arm, Patient Position: Sitting, Cuff Size: Normal)   Pulse 71   Ht 5\' 9"  (1.753 m)   Wt 153 lb (69.4 kg)   SpO2 98%   BMI 22.59 kg/m     General appearance: alert, cooperative and appears stated age  Pelvic: External genitalia:  no lesions              Urethra:  normal appearing urethra with no masses, tenderness or lesions              Bartholins and Skenes: normal                 Vagina: normal appearing vagina with normal color and discharge, no lesions              Cervix: no lesions.  IUD strings noted, approx 2 cm.                Bimanual Exam:  Uterus:  normal size, contour, position, consistency, mobility, non-tender              Adnexa: no mass, fullness, tenderness       Chaperone was present for exam:  Warren Lacy, CMA  ASSESSMENT  IUD check up.  Doing well.  Decreased libido.   PLAN  IUD use of 8 years reviewed.  Can be removed sooner.  Will check testosterone levels.  We discussed testosterone therapy versus Wellbutrin.  Patient is interested in testosterone.  We reviewed potential side effects and that it is  not FDA approved.   I anticipate using testosterone cream.  FU for recheck after 6 weeks of testosterone treatment.  FU for annual exams also.

## 2023-06-22 ENCOUNTER — Ambulatory Visit (INDEPENDENT_AMBULATORY_CARE_PROVIDER_SITE_OTHER): Payer: BC Managed Care – PPO | Admitting: Obstetrics and Gynecology

## 2023-06-22 ENCOUNTER — Encounter: Payer: Self-pay | Admitting: Obstetrics and Gynecology

## 2023-06-22 VITALS — BP 126/80 | HR 71 | Ht 69.0 in | Wt 153.0 lb

## 2023-06-22 DIAGNOSIS — E785 Hyperlipidemia, unspecified: Secondary | ICD-10-CM | POA: Diagnosis not present

## 2023-06-22 DIAGNOSIS — Z30431 Encounter for routine checking of intrauterine contraceptive device: Secondary | ICD-10-CM | POA: Diagnosis not present

## 2023-06-22 DIAGNOSIS — R6882 Decreased libido: Secondary | ICD-10-CM

## 2023-06-23 ENCOUNTER — Other Ambulatory Visit (HOSPITAL_COMMUNITY): Payer: Self-pay

## 2023-06-29 ENCOUNTER — Encounter (HOSPITAL_COMMUNITY): Payer: Self-pay

## 2023-06-29 ENCOUNTER — Other Ambulatory Visit (HOSPITAL_COMMUNITY): Payer: Self-pay

## 2023-06-30 ENCOUNTER — Other Ambulatory Visit: Payer: Self-pay

## 2023-06-30 ENCOUNTER — Other Ambulatory Visit: Payer: Self-pay | Admitting: Obstetrics and Gynecology

## 2023-06-30 MED ORDER — NONFORMULARY OR COMPOUNDED ITEM: 1 refills | Status: AC

## 2023-07-19 NOTE — Progress Notes (Deleted)
Office Visit Note  Patient: Samantha Graves             Date of Birth: January 14, 1971           MRN: [id number]             PCP: Pricilla Riffle, MD Referring: Pricilla Riffle, MD Visit Date: 08/02/2023 Occupation: @GUAROCC @  Subjective:    History of Present Illness: Samantha Graves is a 52 y.o. female with history of psoriatic arthritis.  Patient remains on Simponi 50 mg sq inj every 28 days.   CBC and CMP updated on 05/02/23.  Orders for CBC and CMP released today.  TB gold negative on 12/02/22.  Discussed the importance of holding simponi if she develops signs or symptoms of an infection and to resume once the infection has completely cleared.    Activities of Daily Living:  Patient reports morning stiffness for *** {minute/hour:19697}.   Patient {ACTIONS;DENIES/REPORTS:21021675::"Denies"} nocturnal pain.  Difficulty dressing/grooming: {ACTIONS;DENIES/REPORTS:21021675::"Denies"} Difficulty climbing stairs: {ACTIONS;DENIES/REPORTS:21021675::"Denies"} Difficulty getting out of chair: {ACTIONS;DENIES/REPORTS:21021675::"Denies"} Difficulty using hands for taps, buttons, cutlery, and/or writing: {ACTIONS;DENIES/REPORTS:21021675::"Denies"}  No Rheumatology ROS completed.   PMFS History:  Patient Active Problem List   Diagnosis Date Noted   Leg length discrepancy 09/16/2022   Tear of left acetabular labrum 09/16/2022   Psoriasis 01/11/2017   High risk medication use 01/11/2017   PSORIATIC ARTHRITIS 10/14/2009   ALLERGIC RHINITIS 09/06/2007    Past Medical History:  Diagnosis Date   ADHD    Bile duct injury 2020   mild   Cholecystitis    Elevated LFTs    MGUS (monoclonal gammopathy of unknown significance) 2021   PONV (postoperative nausea and vomiting)    Psoriatic arthritis (HCC)    Rib fracture     Family History  Problem Relation Age of Onset   Hyperlipidemia Mother    Ovarian cysts Mother        benign   Endometriosis Mother    Liver disease Mother         NASH   Other Father        auto imune   Coronary artery disease Father        cardio ablation   COPD Maternal Grandmother    Bone cancer Maternal Grandfather 25   Arthritis/Rheumatoid Sister 109   Endometriosis Sister    Retinitis pigmentosa Sister 86   Ulcerative colitis Sister 21       surgical repair, J pouch   Ovarian cancer Sister 57       negative genetic testing   Liver disease Maternal Aunt        NASH   Colon cancer Neg Hx    Esophageal cancer Neg Hx    Pancreatic cancer Neg Hx    Stomach cancer Neg Hx    Past Surgical History:  Procedure Laterality Date   CHOLECYSTECTOMY N/A 08/13/2019   Procedure: LAPAROSCOPIC CHOLECYSTECTOMY;  Surgeon: Emelia Loron, MD;  Location: [facility] OR;  Service: General;  Laterality: N/A;   INTRAUTERINE DEVICE INSERTION  05/30/15, 05/06/10   MIrena IUD   TONSILLECTOMY AND ADENOIDECTOMY  1990   WISDOM TOOTH EXTRACTION Bilateral 1991   Social History   Social History Narrative   Not on file   Immunization History  Administered Date(s) Administered   Influenza Whole 08/30/2003   Influenza,inj,Quad PF,6+ Mos 09/01/2015, 09/14/2019, 11/14/2020   PFIZER(Purple Top)SARS-COV-2 Vaccination 02/09/2020, 03/03/2020, 09/02/2020   Tdap 02/25/2011, 02/17/2021     Objective: Vital Signs: There were no vitals  taken for this visit.   Physical Exam Vitals and nursing note reviewed.  Constitutional:      Appearance: She is well-developed.  HENT:     Head: Normocephalic and atraumatic.  Eyes:     Conjunctiva/sclera: Conjunctivae normal.  Cardiovascular:     Rate and Rhythm: Normal rate and regular rhythm.     Heart sounds: Normal heart sounds.  Pulmonary:     Effort: Pulmonary effort is normal.     Breath sounds: Normal breath sounds.  Abdominal:     General: Bowel sounds are normal.     Palpations: Abdomen is soft.  Musculoskeletal:     Cervical back: Normal range of motion.  Lymphadenopathy:     Cervical: No cervical adenopathy.  Skin:     General: Skin is warm and dry.     Capillary Refill: Capillary refill takes less than 2 seconds.  Neurological:     Mental Status: She is alert and oriented to person, place, and time.  Psychiatric:        Behavior: Behavior normal.      Musculoskeletal Exam: ***  CDAI Exam: CDAI Score: -- Patient Global: --; Provider Global: -- Swollen: --; Tender: -- Joint Exam 08/02/2023   No joint exam has been documented for this visit   There is currently no information documented on the homunculus. Go to the Rheumatology activity and complete the homunculus joint exam.  Investigation: No additional findings.  Imaging: No results found.  Recent Labs: Lab Results  Component Value Date   WBC 7.0 05/02/2023   HGB 14.1 05/02/2023   PLT 203 05/02/2023   NA 137 05/02/2023   K 4.8 05/02/2023   CL 101 05/02/2023   CO2 25 05/02/2023   GLUCOSE 94 05/02/2023   BUN 18 05/02/2023   CREATININE 0.81 05/02/2023   BILITOT 0.7 05/02/2023   ALKPHOS 41 03/17/2021   AST 19 05/02/2023   ALT 14 05/02/2023   PROT 6.6 05/02/2023   ALBUMIN 4.2 03/17/2021   CALCIUM 9.5 05/02/2023   GFRAA 107 03/16/2021   QFTBGOLD NEGATIVE 11/04/2017   QFTBGOLDPLUS NEGATIVE 12/02/2022    Speciality Comments: Enbrel, Humira-inadequate response Simponi-04/22 Leflunomide-patient did not to start as she was concerned about the side effects of elevated LFTs.  Procedures:  No procedures performed Allergies: Tamiflu [oseltamivir phosphate]   Assessment / Plan:     Visit Diagnoses: Psoriatic arthritis (HCC)  Psoriasis  High risk medication use  Pain in both hands  Chronic SI joint pain  Pain in left hip  Trochanteric bursitis of both hips  Pain in both feet  Plantar fasciitis  Chronic left-sided low back pain with left-sided sciatica  Elevated LFTs  Monoclonal gammopathy  Orders: No orders of the defined types were placed in this encounter.  No orders of the defined types were placed in this  encounter.   Face-to-face time spent with patient was *** minutes. Greater than 50% of time was spent in counseling and coordination of care.  Follow-Up Instructions: No follow-ups on file.   Gearldine Bienenstock, PA-C  Note - This record has been created using Dragon software.  Chart creation errors have been sought, but may not always  have been located. Such creation errors do not reflect on  the standard of medical care.

## 2023-07-20 ENCOUNTER — Ambulatory Visit: Payer: BC Managed Care – PPO | Admitting: Obstetrics and Gynecology

## 2023-07-26 ENCOUNTER — Other Ambulatory Visit (HOSPITAL_COMMUNITY): Payer: Self-pay

## 2023-07-29 ENCOUNTER — Other Ambulatory Visit: Payer: Self-pay

## 2023-07-29 ENCOUNTER — Other Ambulatory Visit (HOSPITAL_COMMUNITY): Payer: Self-pay

## 2023-07-30 ENCOUNTER — Other Ambulatory Visit (HOSPITAL_COMMUNITY): Payer: Self-pay

## 2023-08-02 ENCOUNTER — Ambulatory Visit: Payer: BC Managed Care – PPO | Admitting: Physician Assistant

## 2023-08-02 DIAGNOSIS — R7989 Other specified abnormal findings of blood chemistry: Secondary | ICD-10-CM

## 2023-08-02 DIAGNOSIS — Z79899 Other long term (current) drug therapy: Secondary | ICD-10-CM

## 2023-08-02 DIAGNOSIS — M79671 Pain in right foot: Secondary | ICD-10-CM

## 2023-08-02 DIAGNOSIS — D472 Monoclonal gammopathy: Secondary | ICD-10-CM

## 2023-08-02 DIAGNOSIS — M79641 Pain in right hand: Secondary | ICD-10-CM

## 2023-08-02 DIAGNOSIS — L409 Psoriasis, unspecified: Secondary | ICD-10-CM

## 2023-08-02 DIAGNOSIS — M722 Plantar fascial fibromatosis: Secondary | ICD-10-CM

## 2023-08-02 DIAGNOSIS — L4050 Arthropathic psoriasis, unspecified: Secondary | ICD-10-CM

## 2023-08-02 DIAGNOSIS — G8929 Other chronic pain: Secondary | ICD-10-CM

## 2023-08-02 DIAGNOSIS — M7061 Trochanteric bursitis, right hip: Secondary | ICD-10-CM

## 2023-08-02 DIAGNOSIS — M25552 Pain in left hip: Secondary | ICD-10-CM

## 2023-08-17 ENCOUNTER — Other Ambulatory Visit (HOSPITAL_COMMUNITY): Payer: Self-pay

## 2023-08-18 DIAGNOSIS — F4320 Adjustment disorder, unspecified: Secondary | ICD-10-CM | POA: Diagnosis not present

## 2023-08-23 ENCOUNTER — Other Ambulatory Visit: Payer: Self-pay

## 2023-08-23 ENCOUNTER — Other Ambulatory Visit: Payer: Self-pay | Admitting: Physician Assistant

## 2023-08-23 DIAGNOSIS — L4050 Arthropathic psoriasis, unspecified: Secondary | ICD-10-CM

## 2023-08-23 MED ORDER — SIMPONI 50 MG/0.5ML ~~LOC~~ SOAJ
50.0000 mg | SUBCUTANEOUS | 0 refills | Status: DC
Start: 2023-08-23 — End: 2023-09-27
  Filled 2023-08-23 – 2023-08-26 (×2): qty 0.5, 28d supply, fill #0

## 2023-08-23 NOTE — Telephone Encounter (Deleted)
Please schedule patient a follow up visit. Patient due September 2024. Thanks!   Return in about 3 months (around 08/02/2023) for Psoriatic arthritis.

## 2023-08-23 NOTE — Telephone Encounter (Signed)
Last Fill: 06/01/2023  Labs: 05/02/2023 CBC and CMP WNL   TB Gold: 12/02/2022 Neg    Next Visit: 09/22/2023  Last Visit: 05/02/2023  [id number] arthritis   Current Dose per office note 05/02/2023: Simponi 50 mg subcutaneous injections every 28 days.   Patient advised she is due to update labs. Patient scheduled her follow up appointment and will update labs at that appointment.   Okay to refill Simponi?

## 2023-08-24 ENCOUNTER — Telehealth: Payer: Self-pay | Admitting: Pharmacist

## 2023-08-24 NOTE — Telephone Encounter (Signed)
Patient scanned in copy of new insruance trough [facility]. Please renew auth for Simponi 50mg  SQ pen injector  Next fill is due 08/29/23  Chesley Mires, PharmD, MPH, BCPS, CPP Clinical Pharmacist (Rheumatology and Pulmonology)

## 2023-08-25 DIAGNOSIS — F4320 Adjustment disorder, unspecified: Secondary | ICD-10-CM | POA: Diagnosis not present

## 2023-08-25 NOTE — Telephone Encounter (Addendum)
Submitted an URGENT Prior Authorization request to [facility] for SIMPONI SQ via CoverMyMeds. Will update once we receive a response.  Key: [id number]

## 2023-08-26 ENCOUNTER — Other Ambulatory Visit: Payer: Self-pay

## 2023-08-26 ENCOUNTER — Other Ambulatory Visit: Payer: Self-pay | Admitting: *Deleted

## 2023-08-26 MED ORDER — METHOCARBAMOL 500 MG PO TABS: 500.0000 mg | ORAL_TABLET | Freq: Every day | ORAL | 0 refills | Status: DC | PRN

## 2023-08-26 NOTE — Progress Notes (Signed)
Specialty Pharmacy Refill Coordination Note  Samantha Graves is a 52 y.o. female contacted today regarding refills of specialty medication(s) Golimumab .  Patient requested Delivery  on 08/30/23  to verified address [address]   [place] [address]   Medication will be filled on 08/29/23.

## 2023-08-26 NOTE — Telephone Encounter (Signed)
Last Fill: 05/02/2023  Next Visit: 09/22/2023  Last Visit: 05/02/2023  Dx: Psoriatic arthritis   Current Dose per office note on 05/02/2023: not discussed  Okay to refill Methocarbamol?

## 2023-08-26 NOTE — Telephone Encounter (Signed)
Received notification from [facility] regarding a prior authorization for SIMPONI SQ. Authorization has been APPROVED from 08/25/23 to 05/21/24. Approval letter sent to scan center.  Patient must fill through [facility]: [phone]   Authorization # [id number] Phone # [phone]  Further refills will be sent after lbs are updated  Chesley Mires, PharmD, MPH, BCPS, CPP Clinical Pharmacist (Rheumatology and Pulmonology)

## 2023-08-29 ENCOUNTER — Other Ambulatory Visit: Payer: Self-pay

## 2023-08-29 ENCOUNTER — Other Ambulatory Visit (HOSPITAL_COMMUNITY): Payer: Self-pay

## 2023-08-30 DIAGNOSIS — F4320 Adjustment disorder, unspecified: Secondary | ICD-10-CM | POA: Diagnosis not present

## 2023-08-31 ENCOUNTER — Other Ambulatory Visit: Payer: Self-pay

## 2023-09-01 ENCOUNTER — Other Ambulatory Visit (HOSPITAL_COMMUNITY): Payer: Self-pay

## 2023-09-08 NOTE — Progress Notes (Unsigned)
Office Visit Note  Patient: Samantha Graves             Date of Birth: Jan 12, 1971           MRN: [id number]             PCP: Pricilla Riffle, MD Referring: Pricilla Riffle, MD Visit Date: 09/22/2023 Occupation: @GUAROCC @  Subjective:  Psoriasis flaring   History of Present Illness: Samantha Graves is a 52 y.o. female with history of psoriatic arthritis.  Patient remains on Simponi 50 mg subcutaneous injections every 28 days.  She has been tolerating Simponi without any side effects and has not had any interruptions in therapy.  She is due for her next dose of Simponi on 09/30/2023.  Patient states that she has been experiencing flares of psoriasis over the past few months.  She has psoriasis in both years, both elbows, and scattered on her lower extremities.  She has been applying coconut oil to keep the patches moisturized.  Patient continues to have ongoing discomfort in her left hip joint.  She will be starting dry needling tomorrow.  She denies any SI joint pain at this time.  She denies any Achilles tendinitis or plantar fasciitis.  She denies any joint swelling at this time. She denies any recent or recurrent infections.   Activities of Daily Living:  Patient reports morning stiffness for 30 minutes.   Patient Reports nocturnal pain.  Difficulty dressing/grooming: Denies Difficulty climbing stairs: Denies Difficulty getting out of chair: Reports Difficulty using hands for taps, buttons, cutlery, and/or writing: Denies  Review of Systems  Constitutional:  Positive for fatigue.  HENT:  Negative for mouth sores and mouth dryness.   Eyes:  Negative for dryness.  Respiratory:  Negative for shortness of breath.   Cardiovascular:  Negative for chest pain and palpitations.  Gastrointestinal:  Negative for blood in stool, constipation and diarrhea.  Endocrine: Negative for increased urination.  Genitourinary:  Negative for involuntary urination.  Musculoskeletal:  Positive for  joint pain, joint pain, morning stiffness and muscle tenderness. Negative for gait problem, joint swelling, myalgias, muscle weakness and myalgias.  Skin:  Negative for color change, rash, hair loss and sensitivity to sunlight.  Allergic/Immunologic: Negative for susceptible to infections.  Neurological:  Negative for dizziness and headaches.  Hematological:  Negative for swollen glands.  Psychiatric/Behavioral:  Negative for depressed mood and sleep disturbance. The patient is not nervous/anxious.     PMFS History:  Patient Active Problem List   Diagnosis Date Noted   Leg length discrepancy 09/16/2022   Tear of left acetabular labrum 09/16/2022   Psoriasis 01/11/2017   High risk medication use 01/11/2017   PSORIATIC ARTHRITIS 10/14/2009   Allergic rhinitis 09/06/2007    Past Medical History:  Diagnosis Date   ADHD    Bile duct injury 2020   mild   Cholecystitis    Elevated LFTs    MGUS (monoclonal gammopathy of unknown significance) 2021   PONV (postoperative nausea and vomiting)    Psoriatic arthritis (HCC)    Rib fracture     Family History  Problem Relation Age of Onset   Hyperlipidemia Mother    Ovarian cysts Mother        benign   Endometriosis Mother    Liver disease Mother        NASH   Other Father        auto imune   Coronary artery disease Father  cardio ablation   COPD Maternal Grandmother    Bone cancer Maternal Grandfather 90   Arthritis/Rheumatoid Sister 58   Endometriosis Sister    Retinitis pigmentosa Sister 74   Ulcerative colitis Sister 21       surgical repair, J pouch   Ovarian cancer Sister 59       negative genetic testing   Liver disease Maternal Aunt        NASH   Colon cancer Neg Hx    Esophageal cancer Neg Hx    Pancreatic cancer Neg Hx    Stomach cancer Neg Hx    Past Surgical History:  Procedure Laterality Date   CHOLECYSTECTOMY N/A 08/13/2019   Procedure: LAPAROSCOPIC CHOLECYSTECTOMY;  Surgeon: Emelia Loron, MD;   Location: [facility] OR;  Service: General;  Laterality: N/A;   INTRAUTERINE DEVICE INSERTION  05/30/15, 05/06/10   MIrena IUD   TONSILLECTOMY AND ADENOIDECTOMY  1990   WISDOM TOOTH EXTRACTION Bilateral 1991   Social History   Social History Narrative   Not on file   Immunization History  Administered Date(s) Administered   Influenza Whole 08/30/2003   Influenza,inj,Quad PF,6+ Mos 09/01/2015, 09/14/2019, 11/14/2020   PFIZER(Purple Top)SARS-COV-2 Vaccination 02/09/2020, 03/03/2020, 09/02/2020   Tdap 02/25/2011, 02/17/2021     Objective: Vital Signs: BP 117/78 (BP Location: Left Arm, Patient Position: Sitting, Cuff Size: Normal)   Pulse 70   Resp 14   Ht 5' 7.5" (1.715 m)   Wt 160 lb (72.6 kg)   BMI 24.69 kg/m    Physical Exam Vitals and nursing note reviewed.  Constitutional:      Appearance: She is well-developed.  HENT:     Head: Normocephalic and atraumatic.  Eyes:     Conjunctiva/sclera: Conjunctivae normal.  Cardiovascular:     Rate and Rhythm: Normal rate and regular rhythm.     Heart sounds: Normal heart sounds.  Pulmonary:     Effort: Pulmonary effort is normal.     Breath sounds: Normal breath sounds.  Abdominal:     General: Bowel sounds are normal.     Palpations: Abdomen is soft.  Musculoskeletal:     Cervical back: Normal range of motion.  Lymphadenopathy:     Cervical: No cervical adenopathy.  Skin:    General: Skin is warm and dry.     Capillary Refill: Capillary refill takes less than 2 seconds.     Comments: Patches of psoriasis noted on extensor surface of both elbows and scattered patches on lower legs.    Neurological:     Mental Status: She is alert and oriented to person, place, and time.  Psychiatric:        Behavior: Behavior normal.      Musculoskeletal Exam: C-spine, thoracic spine, lumbar spine good range of motion.  No midline spinal tenderness.  No SI joint tenderness.  Shoulder joints have good range of motion with no discomfort.  Elbow  joints, wrist joints, MCPs, PIPs, DIPs have good range of motion with no synovitis.  Complete fist formation bilaterally.  Hip joints have good range of motion with discomfort in the left hip.  Knee joints have good range of motion no warmth or effusion.  Ankle joints have good range of motion no tenderness or joint swelling.  No evidence of Achilles tendinitis or plantar fasciitis.  CDAI Exam: CDAI Score: -- Patient Global: --; Provider Global: -- Swollen: --; Tender: -- Joint Exam 09/22/2023   No joint exam has been documented for this visit   There  is currently no information documented on the homunculus. Go to the Rheumatology activity and complete the homunculus joint exam.  Investigation: No additional findings.  Imaging: No results found.  Recent Labs: Lab Results  Component Value Date   WBC 7.0 05/02/2023   HGB 14.1 05/02/2023   PLT 203 05/02/2023   NA 137 05/02/2023   K 4.8 05/02/2023   CL 101 05/02/2023   CO2 25 05/02/2023   GLUCOSE 94 05/02/2023   BUN 18 05/02/2023   CREATININE 0.81 05/02/2023   BILITOT 0.7 05/02/2023   ALKPHOS 41 03/17/2021   AST 19 05/02/2023   ALT 14 05/02/2023   PROT 6.6 05/02/2023   ALBUMIN 4.2 03/17/2021   CALCIUM 9.5 05/02/2023   GFRAA 107 03/16/2021   QFTBGOLD NEGATIVE 11/04/2017   QFTBGOLDPLUS NEGATIVE 12/02/2022    Speciality Comments: Enbrel, Humira-inadequate response Simponi-04/22 Leflunomide-patient did not to start as she was concerned about the side effects of elevated LFTs.  Procedures:  No procedures performed Allergies: Tamiflu [oseltamivir phosphate]    Assessment / Plan:     Visit Diagnoses: Psoriatic arthritis (HCC):   Psoriasis: Patient is currently having a flare of psoriasis.  She has been experiencing worsening patches leading up to each Simponi injection.  She currently has patches of psoriasis involving both ear canals, extensor surface of both elbows, and scattered patches on her lower extremities.  She  has been applying coconut oil to moisturize.  She declined a topical steroid agent at this time. Different treatment options were discussed today including adding on methotrexate as combination therapy--patient has a history of elevated LFTs and drinks 2-3 times per week so we will avoid methotrexate and Arava at this time.  Discussed the possibility of adding Otezla as combination therapy versus switching to Tremfya.  Patient would like to switch to Tremfya if approved by insurance.  Indications, contraindications, potential side effects of Tremfya were discussed today in detail.  All questions were addressed and consent was obtained.  She will be eligible to initiate Tremfya on or after 09/30/2023.  She will be discontinuing Simponi.  She is advised to notify us if she cannot tolerate taking Tremfya or if she develops any new or worsening symptoms.  She will follow-up in the office in 3 months or sooner if needed.  Counseled patient that Tremfya is a IL-23 inhibitor.  Counseled patient on purpose, proper use, and adverse effects of Tremfya.  Reviewed the most common adverse effects including infection, URTIs, injection site reactions, nausea/diarrhea, and recurrence of tinea and HSV infections Counseled patient that Tremfya should be held for infection and prior to scheduled surgery.  Recommend annual influenza, PCV 15 or PCV20 or Pneumovax 23, and Shingrix as indicated.  Reviewed the importance of regular labs while on Tremfya therapy.  Will monitor CBC and CMP 1 month after starting and then every 3 months routinely thereafter. Will monitor TB gold annually. Standing orders placed.  Provided patient with medication education material and answered all questions.  Patient voiced understanding.  Patient consented to [facility].  Will upload consent into the media tab.  Reviewed storage instructions of Tremfya.  Advised initial injection must be administered in office.  Patient verbalized understanding.  Will apply  for Tremfya through patient's insurance and update when we receive a response.  Dose will be Tremfya 100 mg on weeks 0 and 4 then every 8 weeks thereafter.  Prescription pending lab results and insurance approval.  High risk medication use -Plan to apply for Tremfya 100 mg sq  injections.  Patient would be eligible to initiate Tremfya on or after 09/30/2023. Previous therapy includes: Enbrel, Humira, Simponi.  History of elevated LFTs.  Patient drinks wine 2-3 nights per week.  Plan to avoid a rave on methotrexate. CBC and CMP updated on 05/02/23.  Orders for Cbc and CMP released today.  Her next lab work will be due in 1 month and every 3 months. TB gold negative on 12/02/22.  Discussed the importance of holding tremfya if she develops signs or symptoms of an infection and to resume once the infection has completely cleared.   - Plan: CBC with Differential/Platelet, COMPLETE METABOLIC PANEL WITH GFR  Pain in both hands - X-rays of bilateral hands are unchanged when compared to 2019 x-rays.  No synovitis or dactylitis noted.  Chronic SI joint pain: No SI joint tenderness upon palpation today.  Pain in left hip - Evaluated by Dr. Jordan Likes.  She continues to have ongoing discomfort involving the left hip.  Patient will be initiating dry needling starting tomorrow.  Trochanteric bursitis of both hips: Patient continues to have ongoing discomfort in the left hip.  She will be initiating dry needling tomorrow.  Pain in both feet - X-rays of bilateral feet showed osteoarthritic changes. A cyst or erosion was noted in the left fifth metatarsal head.  She is not experiencing any increased discomfort in her feet currently.  Plantar fasciitis: Not currently symptomatic.   Other medical conditions are listed as follows:   Monoclonal gammopathy  Elevated LFTs: LFTs WNL on 05/02/23.    Orders: Orders Placed This Encounter  Procedures   CBC with Differential/Platelet   COMPLETE METABOLIC PANEL WITH GFR    No orders of the defined types were placed in this encounter.    Follow-Up Instructions: Return in about 3 months (around 12/23/2023) for Psoriatic arthritis.   Gearldine Bienenstock, PA-C  Note - This record has been created using Dragon software.  Chart creation errors have been sought, but may not always  have been located. Such creation errors do not reflect on  the standard of medical care.

## 2023-09-15 ENCOUNTER — Other Ambulatory Visit (HOSPITAL_COMMUNITY): Payer: Self-pay

## 2023-09-16 ENCOUNTER — Other Ambulatory Visit (HOSPITAL_COMMUNITY): Payer: Self-pay

## 2023-09-19 ENCOUNTER — Other Ambulatory Visit: Payer: Self-pay | Admitting: Rheumatology

## 2023-09-19 ENCOUNTER — Other Ambulatory Visit (HOSPITAL_COMMUNITY): Payer: Self-pay

## 2023-09-19 ENCOUNTER — Other Ambulatory Visit: Payer: Self-pay

## 2023-09-19 DIAGNOSIS — L4050 Arthropathic psoriasis, unspecified: Secondary | ICD-10-CM

## 2023-09-19 NOTE — Progress Notes (Signed)
Refill Request denied  with note patient should contact provider first. Called and spoke with patient aware. Patient states has appointment on Thursday

## 2023-09-19 NOTE — Progress Notes (Signed)
Specialty Pharmacy Refill Coordination Note  Samantha Graves is a 52 y.o. female contacted today regarding refills of specialty medication(s) Golimumab   Patient requested Delivery   Delivery date: 09/27/23   Verified address: [address]  [place] [address]   Medication will be filled on 09/26/23 pending refill request.

## 2023-09-22 ENCOUNTER — Telehealth: Payer: Self-pay | Admitting: Pharmacy Technician

## 2023-09-22 ENCOUNTER — Encounter: Payer: Self-pay | Admitting: Physician Assistant

## 2023-09-22 ENCOUNTER — Ambulatory Visit: Payer: Commercial Managed Care - PPO | Attending: Physician Assistant | Admitting: Physician Assistant

## 2023-09-22 ENCOUNTER — Other Ambulatory Visit: Payer: Self-pay

## 2023-09-22 VITALS — BP 117/78 | HR 70 | Resp 14 | Ht 67.5 in | Wt 160.0 lb

## 2023-09-22 DIAGNOSIS — L409 Psoriasis, unspecified: Secondary | ICD-10-CM | POA: Diagnosis not present

## 2023-09-22 DIAGNOSIS — L4050 Arthropathic psoriasis, unspecified: Secondary | ICD-10-CM

## 2023-09-22 DIAGNOSIS — Z79899 Other long term (current) drug therapy: Secondary | ICD-10-CM

## 2023-09-22 DIAGNOSIS — M25552 Pain in left hip: Secondary | ICD-10-CM

## 2023-09-22 DIAGNOSIS — M533 Sacrococcygeal disorders, not elsewhere classified: Secondary | ICD-10-CM | POA: Diagnosis not present

## 2023-09-22 DIAGNOSIS — M79671 Pain in right foot: Secondary | ICD-10-CM | POA: Diagnosis not present

## 2023-09-22 DIAGNOSIS — M79641 Pain in right hand: Secondary | ICD-10-CM | POA: Diagnosis not present

## 2023-09-22 DIAGNOSIS — D472 Monoclonal gammopathy: Secondary | ICD-10-CM | POA: Diagnosis not present

## 2023-09-22 DIAGNOSIS — M722 Plantar fascial fibromatosis: Secondary | ICD-10-CM

## 2023-09-22 DIAGNOSIS — M79672 Pain in left foot: Secondary | ICD-10-CM

## 2023-09-22 DIAGNOSIS — M7062 Trochanteric bursitis, left hip: Secondary | ICD-10-CM

## 2023-09-22 DIAGNOSIS — R7989 Other specified abnormal findings of blood chemistry: Secondary | ICD-10-CM

## 2023-09-22 DIAGNOSIS — G8929 Other chronic pain: Secondary | ICD-10-CM

## 2023-09-22 DIAGNOSIS — M79642 Pain in left hand: Secondary | ICD-10-CM

## 2023-09-22 DIAGNOSIS — M7061 Trochanteric bursitis, right hip: Secondary | ICD-10-CM

## 2023-09-22 NOTE — Telephone Encounter (Signed)
Submitted a Prior Authorization request to [facility] for [facility] via CoverMyMeds. Will update once we receive a response.  Key: Delfin Gant

## 2023-09-22 NOTE — Patient Instructions (Addendum)
Standing Labs We placed an order today for your standing lab work.   Please have your standing labs drawn in 1 month then every 3 months   Please have your labs drawn 2 weeks prior to your appointment so that the provider can discuss your lab results at your appointment, if possible.  Please note that you may see your imaging and lab results in MyChart before we have reviewed them. We will contact you once all results are reviewed. Please allow our office up to 72 hours to thoroughly review all of the results before contacting the office for clarification of your results.  WALK-IN LAB HOURS  Monday through Thursday from 8:00 am -12:30 pm and 1:00 pm-5:00 pm and Friday from 8:00 am-12:00 pm.  Patients with office visits requiring labs will be seen before walk-in labs.  You may encounter longer than normal wait times. Please allow additional time. Wait times may be shorter on  Monday and Thursday afternoons.  We do not book appointments for walk-in labs. We appreciate your patience and understanding with our staff.   Labs are drawn by [facility]. Please bring your co-pay at the time of your lab draw.  You may receive a bill from [facility] for your lab work.  Guselkumab Injection What is this medication? GUSELKUMAB (goo ZELK ue mab) treats autoimmune conditions, such as arthritis and psoriasis. It works by slowing down an overactive immune system. It is a monoclonal antibody. This medicine may be used for other purposes; ask your health care provider or pharmacist if you have questions. COMMON BRAND NAME(S): Tremfya What should I tell my care team before I take this medication? They need to know if you have any of these conditions: Immune system problems Infection, such as a virus infection, chickenpox, cold sores, herpes, or a history of infections Recently received or scheduled to receive a vaccine Tuberculosis, a positive skin test for tuberculosis, or recent close contact with someone who has  tuberculosis An unusual or allergic reaction to guselkumab, other medications, foods, dyes, or preservatives Pregnant or trying to get pregnant Breast-feeding How should I use this medication? This medication is injected under the skin. It is usually given by a care team in a hospital or clinic setting. It may also be given at home. If you get this medication at home, you will be taught how to prepare and give it. Use exactly as directed. Take it as directed on the prescription label. Keep taking it unless your care team tells you to stop. It is important that you put your used needles and syringes in a special sharps container. Do not put them in a trash can. If you do not have a sharps container, call your pharmacist or care team to get one. This medication comes with INSTRUCTIONS FOR USE. Ask your pharmacist for directions on how to use this medication. Read the information carefully. Talk to your pharmacist or care team if you have questions. A special MedGuide will be given to you by the pharmacist with each prescription and refill. Be sure to read this information carefully each time. Talk to your care team about the use of this medication in children. Special care may be needed. Overdosage: If you think you have taken too much of this medicine contact a poison control center or emergency room at once. NOTE: This medicine is only for you. Do not share this medicine with others. What if I miss a dose? If you get this medication at a hospital or clinic:  It is important not to miss your dose. Call your care team if you are unable to keep an appointment. If you give yourself this medication at home: If you miss a dose, take it as soon as you can. Then continue your normal schedule. If it is almost time for your next dose, take only that dose. Do not take double or extra doses. Call your care team with questions. What may interact with this medication? Do not take this medication with any of the  following: Live virus vaccines This medication may also interact with the following: Amoxapine Certain medications for depression, anxiety, or mental health conditions, such as amitriptyline, clomipramine, desipramine, doxepin, imipramine, maprotiline, nortriptyline, protriptyline, trimipramine Codeine Inactivated vaccines Methadone Pimozide Thioridazine This list may not describe all possible interactions. Give your health care provider a list of all the medicines, herbs, non-prescription drugs, or dietary supplements you use. Also tell them if you smoke, drink alcohol, or use illegal drugs. Some items may interact with your medicine. What should I watch for while using this medication? Your condition will be monitored carefully while you are receiving this medication. Tell your care team if your symptoms do not start to get better or if they get worse. You will be tested for tuberculosis (TB) before you start this medication. If your care team prescribes any medication for TB, you should start taking the TB medication before starting this medication. Make sure to finish the full course of TB medication. This medication may increase your risk of getting an infection. Call your care team for advice if you get a fever, chills, sore throat, or other symptoms of a cold or flu. Do not treat yourself. Try to avoid being around people who are sick. What side effects may I notice from receiving this medication? Side effects that you should report to your care team as soon as possible: Allergic reactions--skin rash, itching, hives, swelling of the face, lips, tongue, or throat Infection--fever, chills, cough, sore throat, wounds that don't heal, pain or trouble when passing urine, general feeling of discomfort or being unwell Side effects that usually do not require medical attention (report to your care team if they continue or are bothersome): Diarrhea Headache Joint pain Pain, redness, or  irritation at injection site Runny or stuffy nose Sore throat This list may not describe all possible side effects. Call your doctor for medical advice about side effects. You may report side effects to FDA at 1-800-FDA-1088. Where should I keep my medication? Keep out of the reach of children and pets. Store in the refrigerator. Do not freeze. Keep it in the original container until you are ready to use. Protect from light. Do not shake. Remove the dose from the refrigerator about 30 minutes prior to use. Get rid of any unused medication after the expiration date on the label. To get rid of medications that are no longer needed or have expired: Take the medication to a medication take-back program. Check with your pharmacy or law enforcement to find a location. If you cannot return the medication, ask your pharmacist or care team how to get rid of this medication safely. NOTE: This sheet is a summary. It may not cover all possible information. If you have questions about this medicine, talk to your doctor, pharmacist, or health care provider.  2024 Elsevier/Gold Standard (2022-06-03 00:00:00)   Please note if you are on Hydroxychloroquine and and an order has been placed for a Hydroxychloroquine level,  you will need to have  it drawn 4 hours or more after your last dose.  If you wish to have your labs drawn at another location, please call the office 24 hours in advance so we can fax the orders.  The office is located at [address], [place], [place] [address]   If you have any questions regarding directions or hours of operation,  please call [phone].   As a reminder, please drink plenty of water prior to coming for your lab work. Thanks!

## 2023-09-22 NOTE — Telephone Encounter (Signed)
-----   Message from Nurse Richardo Priest sent at 09/22/2023  4:10 PM EDT ----- Per Sherron Ales, PA-C, please apply for Tremfya. Consent obtained and sent to scan center. Thanks!

## 2023-09-23 LAB — CBC WITH DIFFERENTIAL/PLATELET
Absolute Lymphocytes: 1455 {cells}/uL (ref 850–3900)
Absolute Monocytes: 372 {cells}/uL (ref 200–950)
Basophils Absolute: 19 {cells}/uL (ref 0–200)
Basophils Relative: 0.3 %
Eosinophils Absolute: 50 {cells}/uL (ref 15–500)
Eosinophils Relative: 0.8 %
HCT: 40.9 % (ref 35.0–45.0)
Hemoglobin: 13.4 g/dL (ref 11.7–15.5)
MCH: 31.9 pg (ref 27.0–33.0)
MCHC: 32.8 g/dL (ref 32.0–36.0)
MCV: 97.4 fL (ref 80.0–100.0)
MPV: 11.1 fL (ref 7.5–12.5)
Monocytes Relative: 5.9 %
Neutro Abs: 4404 {cells}/uL (ref 1500–7800)
Neutrophils Relative %: 69.9 %
Platelets: 191 10*3/uL (ref 140–400)
RBC: 4.2 10*6/uL (ref 3.80–5.10)
RDW: 11.1 % (ref 11.0–15.0)
Total Lymphocyte: 23.1 %
WBC: 6.3 10*3/uL (ref 3.8–10.8)

## 2023-09-23 LAB — COMPLETE METABOLIC PANEL WITH GFR
AG Ratio: 1.9 (calc) (ref 1.0–2.5)
ALT: 10 U/L (ref 6–29)
AST: 17 U/L (ref 10–35)
Albumin: 4.2 g/dL (ref 3.6–5.1)
Alkaline phosphatase (APISO): 40 U/L (ref 37–153)
BUN: 16 mg/dL (ref 7–25)
CO2: 27 mmol/L (ref 20–32)
Calcium: 9.5 mg/dL (ref 8.6–10.4)
Chloride: 103 mmol/L (ref 98–110)
Creat: 0.81 mg/dL (ref 0.50–1.03)
Globulin: 2.2 g/dL (ref 1.9–3.7)
Glucose, Bld: 104 mg/dL — ABNORMAL HIGH (ref 65–99)
Potassium: 4.3 mmol/L (ref 3.5–5.3)
Sodium: 138 mmol/L (ref 135–146)
Total Bilirubin: 0.6 mg/dL (ref 0.2–1.2)
Total Protein: 6.4 g/dL (ref 6.1–8.1)
eGFR: 88 mL/min/{1.73_m2} (ref >=60)

## 2023-09-23 NOTE — Progress Notes (Signed)
Glucose is 104. Rest of CMP WNL. CBC WNL.   Ok to apply for tremfya.

## 2023-09-26 ENCOUNTER — Other Ambulatory Visit: Payer: Self-pay

## 2023-09-26 NOTE — Progress Notes (Signed)
Patient will be changing therapy to [facility].

## 2023-09-27 ENCOUNTER — Other Ambulatory Visit: Payer: Self-pay

## 2023-09-27 MED ORDER — TREMFYA 100 MG/ML ~~LOC~~ SOAJ
SUBCUTANEOUS | 0 refills | Status: DC
  Filled 2023-09-28: qty 1, 28d supply, fill #0

## 2023-09-27 NOTE — Telephone Encounter (Signed)
Tremfya copay card: Member id # [id number] Group # [id number] BIN # [id number]

## 2023-09-27 NOTE — Telephone Encounter (Signed)
Received notification from [facility] regarding a prior authorization for [facility]. Authorization has been APPROVED. Approval letter sent to scan center.  Patient must fill through [facility]: [phone]   Authorization # [id number] (09/26/23 - 10/26/23) - 1mL per 28 days Authorization # [id number] (10/17/23 - 03/15/24) - 1mL per 56 days  Attempted to enroll patient into Tremfya copay card however states that patient is already signed up. I called copay card company below but they require patient to call to enroll into program. MyChart message sent to patient  Copay card company: [phone]  Patient scheduled for Tremfya new start on 10/06/2023.  Chesley Mires, PharmD, MPH, BCPS, CPP Clinical Pharmacist (Rheumatology and Pulmonology)

## 2023-09-28 ENCOUNTER — Other Ambulatory Visit: Payer: Self-pay

## 2023-09-28 ENCOUNTER — Other Ambulatory Visit (HOSPITAL_COMMUNITY): Payer: Self-pay

## 2023-09-28 NOTE — Progress Notes (Signed)
Specialty Pharmacy Initial Fill Coordination Note  Samantha Graves is a 52 y.o. female contacted today regarding refills of specialty medication(s) Guselkumab   Patient requested Courier to Provider Office   Delivery date: 10/04/23   Verified address: [address] [place], [place] [address]   Medication will be filled on 10/03/23.   Patient is aware of $0.00 copayment.

## 2023-10-03 ENCOUNTER — Other Ambulatory Visit (HOSPITAL_COMMUNITY): Payer: Self-pay

## 2023-10-03 ENCOUNTER — Other Ambulatory Visit: Payer: Self-pay | Admitting: Pharmacist

## 2023-10-03 ENCOUNTER — Other Ambulatory Visit: Payer: Self-pay

## 2023-10-03 NOTE — Progress Notes (Addendum)
Patient has new start on Thursday. Will complete counseling at that visit. Preliminary counseling completed at [facility] on 09/22/23 by Sherron Ales, PA-C  Chesley Mires, PharmD, MPH, BCPS, CPP Clinical Pharmacist (Rheumatology and Pulmonology)

## 2023-10-03 NOTE — Telephone Encounter (Signed)
Copay card rejected on initial billing, a digit was left out when the information was sent to [place]. An image search for the card provided the missing number; for future reference the correct RxGRP# is [id number]. Claim successfully adjudicates for $0.00.

## 2023-10-06 ENCOUNTER — Other Ambulatory Visit (HOSPITAL_COMMUNITY): Payer: Self-pay

## 2023-10-06 ENCOUNTER — Ambulatory Visit: Payer: Commercial Managed Care - PPO | Attending: Rheumatology | Admitting: Pharmacist

## 2023-10-06 ENCOUNTER — Other Ambulatory Visit: Payer: Self-pay

## 2023-10-06 DIAGNOSIS — Z7189 Other specified counseling: Secondary | ICD-10-CM

## 2023-10-06 DIAGNOSIS — L409 Psoriasis, unspecified: Secondary | ICD-10-CM

## 2023-10-06 DIAGNOSIS — Z79899 Other long term (current) drug therapy: Secondary | ICD-10-CM

## 2023-10-06 DIAGNOSIS — L4050 Arthropathic psoriasis, unspecified: Secondary | ICD-10-CM

## 2023-10-06 DIAGNOSIS — Z111 Encounter for screening for respiratory tuberculosis: Secondary | ICD-10-CM

## 2023-10-06 MED ORDER — TREMFYA 100 MG/ML ~~LOC~~ SOAJ
SUBCUTANEOUS | 0 refills | Status: DC
  Filled 2023-10-06: qty 1, fill #0
  Filled 2023-11-18 (×2): qty 1, 56d supply, fill #0

## 2023-10-06 NOTE — Patient Instructions (Signed)
Your next TREMFYA dose is due on 11/03/2023, then every 8 weeks thereafter starting on 12/29/2023  HOLD TREMFYA if you have signs or symptoms of an infection. You can resume once you feel better or back to your baseline. HOLD TREMFYA if you start antibiotics to treat an infection. HOLD TREMFYA around the time of surgery/procedures. Your surgeon will be able to provide recommendations on when to hold BEFORE and when you are cleared to RESUME.  Pharmacy information: Your prescription will be shipped from [facility]. Their phone number is [phone] They will call to schedule shipment and confirm address. They will mail your medication to your home.  Labs are due in 1 month then every 3 months. Lab hours are from Monday to Thursday 8am-12:30pm and 1pm-5pm and Friday 8am-12pm. You do not need an appointment if you come for labs during these times. If you'd like to go to a [facility] or [facility] closer to home, please call our clinic 48 hours prior to lab date so we can release orders in a timely manner.  Stay up to date on all routine vaccines: influenza, pneumonia, COVID19, Shingles  How to manage an injection site reaction: Remember the 5 C's: COUNTER - leave on the counter at least 30 minutes but up to overnight to bring medication to room temperature. This may help prevent stinging COLD - place something cold (like an ice gel pack or cold water bottle) on the injection site just before cleansing with alcohol. This may help reduce pain CLARITIN - use Claritin (generic name is loratadine) for the first two weeks of treatment or the day of, the day before, and the day after injecting. This will help to minimize injection site reactions CORTISONE CREAM - apply if injection site is irritated and itching CALL ME - if injection site reaction is bigger than the size of your fist, looks infected, blisters, or if you develop hives

## 2023-10-06 NOTE — Progress Notes (Signed)
Pharmacy Note  Subjective:   Patient presents to clinic today to receive first dose of Tremfya for psoriatic arthritis + psoriasis. Patient is switching from Simponi which she started on 08/14/2020. Last dose of Simponi was in early October 2024.  Patient running a fever or have signs/symptoms of infection? No  Patient currently on antibiotics for the treatment of infection? No  Patient have any upcoming invasive procedures/surgeries? No  Objective: CMP     Component Value Date/Time   NA 138 09/22/2023 1313   K 4.3 09/22/2023 1313   CL 103 09/22/2023 1313   CO2 27 09/22/2023 1313   GLUCOSE 104 (H) 09/22/2023 1313   BUN 16 09/22/2023 1313   CREATININE 0.81 09/22/2023 1313   CALCIUM 9.5 09/22/2023 1313   PROT 6.4 09/22/2023 1313   ALBUMIN 4.2 03/17/2021 1242   AST 17 09/22/2023 1313   AST 23 11/14/2020 1011   ALT 10 09/22/2023 1313   ALT 17 11/14/2020 1011   ALKPHOS 41 03/17/2021 1242   BILITOT 0.6 09/22/2023 1313   BILITOT 0.9 11/14/2020 1011   GFRNONAA 92 03/16/2021 1415   GFRAA 107 03/16/2021 1415    CBC    Component Value Date/Time   WBC 6.3 09/22/2023 1313   RBC 4.20 09/22/2023 1313   HGB 13.4 09/22/2023 1313   HGB 14.8 11/14/2020 1011   HGB 13.2 04/02/2013 1416   HCT 40.9 09/22/2023 1313   PLT 191 09/22/2023 1313   PLT 197 11/14/2020 1011   MCV 97.4 09/22/2023 1313   MCH 31.9 09/22/2023 1313   MCHC 32.8 09/22/2023 1313   RDW 11.1 09/22/2023 1313   LYMPHSABS 1,708 05/02/2023 1420   MONOABS 0.4 11/14/2020 1011   EOSABS 50 09/22/2023 1313   BASOSABS 19 09/22/2023 1313    Baseline Immunosuppressant Therapy Labs TB GOLD    Latest Ref Rng & Units 12/02/2022    3:42 PM  Quantiferon TB Gold  Quantiferon TB Gold Plus NEGATIVE NEGATIVE    Hepatitis Panel    Latest Ref Rng & Units 06/29/2019   10:33 AM  Hepatitis  Hep B Surface Ag NON-REACTI NON-REACTIVE   Hep C Ab NON-REACTI NON-REACTIVE    HIV Lab Results  Component Value Date   HIV NON-REACTIVE  04/15/2020   Immunoglobulins    Latest Ref Rng & Units 11/14/2020   10:13 AM  Immunoglobulin Electrophoresis  IgG 586 - 1,602 mg/dL 956   IgM 26 - 387 mg/dL 564    SPEP    Latest Ref Rng & Units 09/22/2023    1:13 PM  Serum Protein Electrophoresis  Total Protein 6.1 - 8.1 g/dL 6.4    Chest x-ray: 3/32/9518 - No active cardiopulmonary disease.  Assessment/Plan:  Reviewed importance of holding Tremfya with signs/symptoms of an infections, if antibiotics are prescribed to treat an active infection, and with invasive procedures  Demonstrated proper injection technique with Tremfya demo device  Patient able to demonstrate proper injection technique using the teach back method.  Patient self injected in the right lower abdomen with:  Pharmacy-supplied Medication: Tremfya 100mg /mL autoinjector NDC: [phone] Lot: [id number] Expiration: 01/2025  Patient tolerated well.  Observed for 30 mins in office for adverse reaction. Patient denies itchiness and irritation at injection., No swelling or redness noted., and Reviewed injection site reaction management with patient verbally and printed information for review in AVS  Patient is to return in 1 month for labs and 6-8 weeks for follow-up appointment.  Standing orders for CBC/CMP placed.  TB gold will be  monitored yearly.   TREMFYA approved through insurance .   Rx sent to: [facility]: [phone] .  Patient provided with pharmacy phone number and advised that they will call to schedule shipment to home.  Patient will continue Tremfya 100mg  subcut at Week 0 (administered in clinic today), at Week 4, then every 8 weeks thereafter as monotherapy  All questions encouraged and answered.  Instructed patient to call with any further questions or concerns.  Chesley Mires, PharmD, MPH, BCPS, CPP Clinical Pharmacist (Rheumatology and Pulmonology)  10/06/2023 10:02 AM

## 2023-10-07 ENCOUNTER — Other Ambulatory Visit: Payer: Self-pay

## 2023-10-24 ENCOUNTER — Other Ambulatory Visit: Payer: Self-pay

## 2023-10-26 ENCOUNTER — Other Ambulatory Visit (HOSPITAL_COMMUNITY): Payer: Self-pay

## 2023-10-26 ENCOUNTER — Encounter (HOSPITAL_COMMUNITY): Payer: Self-pay

## 2023-10-28 ENCOUNTER — Other Ambulatory Visit (HOSPITAL_COMMUNITY): Payer: Self-pay

## 2023-10-31 ENCOUNTER — Other Ambulatory Visit (HOSPITAL_COMMUNITY): Payer: Self-pay

## 2023-11-18 ENCOUNTER — Other Ambulatory Visit: Payer: Self-pay

## 2023-11-18 NOTE — Progress Notes (Deleted)
Office Visit Note  Patient: Samantha Graves             Date of Birth: 1971/08/09           MRN: [id number]             PCP: Pricilla Riffle, MD Referring: Pricilla Riffle, MD Visit Date: 12/02/2023 Occupation: @GUAROCC @  Subjective:  Medication monitoring   History of Present Illness: Samantha Graves is a 52 y.o. female with history of psoriatic arthritis.     CBC and CMP updated on 09/22/23. Orders for CBC and CMP released today.  TB gold negative on 12/02/22. Order for TB gold released today.    Activities of Daily Living:  Patient reports morning stiffness for *** {minute/hour:19697}.   Patient {ACTIONS;DENIES/REPORTS:21021675::"Denies"} nocturnal pain.  Difficulty dressing/grooming: {ACTIONS;DENIES/REPORTS:21021675::"Denies"} Difficulty climbing stairs: {ACTIONS;DENIES/REPORTS:21021675::"Denies"} Difficulty getting out of chair: {ACTIONS;DENIES/REPORTS:21021675::"Denies"} Difficulty using hands for taps, buttons, cutlery, and/or writing: {ACTIONS;DENIES/REPORTS:21021675::"Denies"}  No Rheumatology ROS completed.   PMFS History:  Patient Active Problem List   Diagnosis Date Noted   Leg length discrepancy 09/16/2022   Tear of left acetabular labrum 09/16/2022   Psoriasis 01/11/2017   High risk medication use 01/11/2017   PSORIATIC ARTHRITIS 10/14/2009   Allergic rhinitis 09/06/2007    Past Medical History:  Diagnosis Date   ADHD    Bile duct injury 2020   mild   Cholecystitis    Elevated LFTs    MGUS (monoclonal gammopathy of unknown significance) 2021   PONV (postoperative nausea and vomiting)    Psoriatic arthritis (HCC)    Rib fracture     Family History  Problem Relation Age of Onset   Hyperlipidemia Mother    Ovarian cysts Mother        benign   Endometriosis Mother    Liver disease Mother        NASH   Other Father        auto imune   Coronary artery disease Father        cardio ablation   COPD Maternal Grandmother    Bone cancer Maternal  Grandfather 32   Arthritis/Rheumatoid Sister 71   Endometriosis Sister    Retinitis pigmentosa Sister 53   Ulcerative colitis Sister 21       surgical repair, J pouch   Ovarian cancer Sister 1       negative genetic testing   Liver disease Maternal Aunt        NASH   Colon cancer Neg Hx    Esophageal cancer Neg Hx    Pancreatic cancer Neg Hx    Stomach cancer Neg Hx    Past Surgical History:  Procedure Laterality Date   CHOLECYSTECTOMY N/A 08/13/2019   Procedure: LAPAROSCOPIC CHOLECYSTECTOMY;  Surgeon: Emelia Loron, MD;  Location: [facility] OR;  Service: General;  Laterality: N/A;   INTRAUTERINE DEVICE INSERTION  05/30/15, 05/06/10   MIrena IUD   TONSILLECTOMY AND ADENOIDECTOMY  1990   WISDOM TOOTH EXTRACTION Bilateral 1991   Social History   Social History Narrative   Not on file   Immunization History  Administered Date(s) Administered   Influenza Whole 08/30/2003   Influenza,inj,Quad PF,6+ Mos 09/01/2015, 09/14/2019, 11/14/2020   PFIZER(Purple Top)SARS-COV-2 Vaccination 02/09/2020, 03/03/2020, 09/02/2020   Tdap 02/25/2011, 02/17/2021     Objective: Vital Signs: There were no vitals taken for this visit.   Physical Exam Vitals and nursing note reviewed.  Constitutional:      Appearance: She is well-developed.  HENT:  Head: Normocephalic and atraumatic.  Eyes:     Conjunctiva/sclera: Conjunctivae normal.  Cardiovascular:     Rate and Rhythm: Normal rate and regular rhythm.     Heart sounds: Normal heart sounds.  Pulmonary:     Effort: Pulmonary effort is normal.     Breath sounds: Normal breath sounds.  Abdominal:     General: Bowel sounds are normal.     Palpations: Abdomen is soft.  Musculoskeletal:     Cervical back: Normal range of motion.  Lymphadenopathy:     Cervical: No cervical adenopathy.  Skin:    General: Skin is warm and dry.     Capillary Refill: Capillary refill takes less than 2 seconds.  Neurological:     Mental Status: She is alert and  oriented to person, place, and time.  Psychiatric:        Behavior: Behavior normal.      Musculoskeletal Exam: ***  CDAI Exam: CDAI Score: -- Patient Global: --; Provider Global: -- Swollen: --; Tender: -- Joint Exam 12/02/2023   No joint exam has been documented for this visit   There is currently no information documented on the homunculus. Go to the Rheumatology activity and complete the homunculus joint exam.  Investigation: No additional findings.  Imaging: No results found.  Recent Labs: Lab Results  Component Value Date   WBC 6.3 09/22/2023   HGB 13.4 09/22/2023   PLT 191 09/22/2023   NA 138 09/22/2023   K 4.3 09/22/2023   CL 103 09/22/2023   CO2 27 09/22/2023   GLUCOSE 104 (H) 09/22/2023   BUN 16 09/22/2023   CREATININE 0.81 09/22/2023   BILITOT 0.6 09/22/2023   ALKPHOS 41 03/17/2021   AST 17 09/22/2023   ALT 10 09/22/2023   PROT 6.4 09/22/2023   ALBUMIN 4.2 03/17/2021   CALCIUM 9.5 09/22/2023   GFRAA 107 03/16/2021   QFTBGOLD NEGATIVE 11/04/2017   QFTBGOLDPLUS NEGATIVE 12/02/2022    Speciality Comments: Enbrel, Humira-inadequate response Simponi-04/22 Leflunomide-patient did not to start as she was concerned about the side effects of elevated LFTs.  Procedures:  No procedures performed Allergies: Tamiflu [oseltamivir phosphate]   Assessment / Plan:     Visit Diagnoses: Psoriatic arthritis (HCC)  Psoriasis  High risk medication use  Pain in both hands  Chronic SI joint pain  Pain in left hip  Trochanteric bursitis of both hips  Pain in both feet  Plantar fasciitis  Monoclonal gammopathy  Elevated LFTs  Orders: No orders of the defined types were placed in this encounter.  No orders of the defined types were placed in this encounter.   Face-to-face time spent with patient was *** minutes. Greater than 50% of time was spent in counseling and coordination of care.  Follow-Up Instructions: No follow-ups on file.   Gearldine Bienenstock, PA-C  Note - This record has been created using Dragon software.  Chart creation errors have been sought, but may not always  have been located. Such creation errors do not reflect on  the standard of medical care.

## 2023-11-18 NOTE — Progress Notes (Signed)
Specialty Pharmacy Refill Coordination Note  Samantha Graves is a 52 y.o. female contacted today regarding refills of specialty medication(s) Guselkumab Vincent Peyer)   Patient requested [name] at [facility] [place] date: 11/19/23   Medication will be filled on 11/18/23.

## 2023-11-21 ENCOUNTER — Other Ambulatory Visit (HOSPITAL_COMMUNITY): Payer: Self-pay

## 2023-12-02 ENCOUNTER — Ambulatory Visit: Payer: Commercial Managed Care - PPO | Admitting: Physician Assistant

## 2023-12-02 DIAGNOSIS — Z79899 Other long term (current) drug therapy: Secondary | ICD-10-CM

## 2023-12-02 DIAGNOSIS — L4050 Arthropathic psoriasis, unspecified: Secondary | ICD-10-CM

## 2023-12-02 DIAGNOSIS — G8929 Other chronic pain: Secondary | ICD-10-CM

## 2023-12-02 DIAGNOSIS — M25552 Pain in left hip: Secondary | ICD-10-CM

## 2023-12-02 DIAGNOSIS — M7061 Trochanteric bursitis, right hip: Secondary | ICD-10-CM

## 2023-12-02 DIAGNOSIS — M79641 Pain in right hand: Secondary | ICD-10-CM

## 2023-12-02 DIAGNOSIS — M79672 Pain in left foot: Secondary | ICD-10-CM

## 2023-12-02 DIAGNOSIS — D472 Monoclonal gammopathy: Secondary | ICD-10-CM

## 2023-12-02 DIAGNOSIS — M722 Plantar fascial fibromatosis: Secondary | ICD-10-CM

## 2023-12-02 DIAGNOSIS — R7989 Other specified abnormal findings of blood chemistry: Secondary | ICD-10-CM

## 2023-12-02 DIAGNOSIS — L409 Psoriasis, unspecified: Secondary | ICD-10-CM

## 2023-12-26 ENCOUNTER — Other Ambulatory Visit: Payer: Self-pay | Admitting: Physician Assistant

## 2023-12-26 ENCOUNTER — Telehealth: Payer: Commercial Managed Care - PPO | Admitting: Physician Assistant

## 2023-12-26 ENCOUNTER — Other Ambulatory Visit: Payer: Self-pay

## 2023-12-26 DIAGNOSIS — A084 Viral intestinal infection, unspecified: Secondary | ICD-10-CM | POA: Diagnosis not present

## 2023-12-26 DIAGNOSIS — L409 Psoriasis, unspecified: Secondary | ICD-10-CM

## 2023-12-26 DIAGNOSIS — Z79899 Other long term (current) drug therapy: Secondary | ICD-10-CM

## 2023-12-26 DIAGNOSIS — L4050 Arthropathic psoriasis, unspecified: Secondary | ICD-10-CM

## 2023-12-26 MED ORDER — ONDANSETRON 4 MG PO TBDP: 4.0000 mg | ORAL_TABLET | Freq: Three times a day (TID) | ORAL | 0 refills | Status: AC | PRN

## 2023-12-26 MED ORDER — AZITHROMYCIN 500 MG PO TABS: 500.0000 mg | ORAL_TABLET | Freq: Every day | ORAL | 0 refills | Status: AC

## 2023-12-26 NOTE — Progress Notes (Signed)
E-Visit for Diarrhea  We are sorry that you are not feeling well.  Here is how we plan to help!  Based on what you have shared with me it looks like you have Acute Infectious Diarrhea.  Most cases of acute diarrhea are due to infections with virus and bacteria and are self-limited conditions lasting less than 14 days.  For your symptoms you may take Imodium 2 mg tablets that are over the counter at your local pharmacy. Take two tablet now and then one after each loose stool up to 6 a day.  Antibiotics are not needed for most people with diarrhea.  Optional: Zofran 4 mg 1 tablet every 8 hours as needed for nausea and vomiting  Optional: I have prescribed azithromycin 500 mg daily for 3 days  HOME CARE We recommend changing your diet to help with your symptoms for the next few days. Drink plenty of fluids that contain water salt and sugar. Sports drinks such as Gatorade may help.  You may try broths, soups, bananas, applesauce, soft breads, mashed potatoes or crackers.  You are considered infectious for as long as the diarrhea continues. Hand washing or use of alcohol based hand sanitizers is recommend. It is best to stay out of work or school until your symptoms stop.   GET HELP RIGHT AWAY If you have dark yellow colored urine or do not pass urine frequently you should drink more fluids.   If your symptoms worsen  If you feel like you are going to pass out (faint) You have a new problem  MAKE SURE YOU  Understand these instructions. Will watch your condition. Will get help right away if you are not doing well or get worse.  Thank you for choosing an e-visit.  Your e-visit answers were reviewed by a board certified advanced clinical practitioner to complete your personal care plan. Depending upon the condition, your plan could have included both over the counter or prescription medications.  Please review your pharmacy choice. Make sure the pharmacy is open so you can pick up  prescription now. If there is a problem, you may contact your provider through [employer] and have the prescription routed to another pharmacy.  Your safety is important to [place]. If you have drug allergies check your prescription carefully.   For the next 24 hours you can use MyChart to ask questions about today's visit, request a non-urgent call back, or ask for a work or school excuse. You will get an email in the next two days asking about your experience. I hope that your e-visit has been valuable and will speed your recovery.  I have spent 5 minutes in review of e-visit questionnaire, review and updating patient chart, medical decision making and response to patient.   Margaretann Loveless, PA-C

## 2023-12-29 ENCOUNTER — Other Ambulatory Visit: Payer: Self-pay | Admitting: Physician Assistant

## 2023-12-29 DIAGNOSIS — L4050 Arthropathic psoriasis, unspecified: Secondary | ICD-10-CM

## 2023-12-29 DIAGNOSIS — Z79899 Other long term (current) drug therapy: Secondary | ICD-10-CM

## 2023-12-29 DIAGNOSIS — L409 Psoriasis, unspecified: Secondary | ICD-10-CM

## 2023-12-30 ENCOUNTER — Other Ambulatory Visit: Payer: Self-pay

## 2023-12-30 MED ORDER — TREMFYA 100 MG/ML ~~LOC~~ SOAJ
SUBCUTANEOUS | 0 refills | Status: DC
  Filled 2023-12-30: qty 1, fill #0
  Filled 2024-01-03: qty 1, 56d supply, fill #0

## 2023-12-30 NOTE — Telephone Encounter (Signed)
Please schedule patient a follow up visit. Patient due January 2025. Thanks!   Follow-Up Instructions: Return in about 3 months (around 12/23/2023) for Psoriatic arthritis.

## 2023-12-30 NOTE — Telephone Encounter (Signed)
Last Fill: 10/06/2023  Labs: 09/22/2023 Glucose is 104. Rest of CMP WNL. CBC WNL.     TB Gold: 12/02/2022   Next Visit: Due January 2025. Message sent to the front to schedule.   Last Visit: 09/22/2023  DX: Psoriatic arthritis   Current Dose per office note 09/22/2023: Tremfya 100 mg every 8 weeks   Okay to refill Tremfyia?

## 2023-12-30 NOTE — Telephone Encounter (Signed)
 Attempted to contact patient and left message to advise patient to call the office and schedule patient a follow up appointment.

## 2024-01-03 ENCOUNTER — Other Ambulatory Visit: Payer: Self-pay

## 2024-01-03 NOTE — Progress Notes (Signed)
 Specialty Pharmacy Refill Coordination Note  Samantha Graves is a 53 y.o. female contacted today regarding refills of specialty medication(s) Guselkumab  (Tremfya )   Patient requested Delivery   Delivery date: 01/04/24   Verified address: [address], [place], [address]   Medication will be filled on 01/03/24.

## 2024-02-22 ENCOUNTER — Other Ambulatory Visit: Payer: Self-pay

## 2024-02-23 ENCOUNTER — Other Ambulatory Visit: Payer: Self-pay | Admitting: Pharmacy Technician

## 2024-02-23 ENCOUNTER — Other Ambulatory Visit: Payer: Self-pay

## 2024-02-23 ENCOUNTER — Other Ambulatory Visit: Payer: Self-pay | Admitting: Rheumatology

## 2024-02-23 DIAGNOSIS — L409 Psoriasis, unspecified: Secondary | ICD-10-CM

## 2024-02-23 DIAGNOSIS — Z79899 Other long term (current) drug therapy: Secondary | ICD-10-CM

## 2024-02-23 DIAGNOSIS — L4050 Arthropathic psoriasis, unspecified: Secondary | ICD-10-CM

## 2024-02-23 NOTE — Progress Notes (Signed)
 Refills were denied. Patient needs office visit and labs. Left voice mail for patient.

## 2024-02-23 NOTE — Progress Notes (Signed)
 Specialty Pharmacy Refill Coordination Note  Samantha Graves is a 53 y.o. female contacted today regarding refills of specialty medication(s) Guselkumab Vincent Peyer)   Patient requested (Patient-Rptd) Delivery   Delivery date: 03/01/24   Verified address: (Patient-Rptd) [address] [place], [place] [address]   Medication will be filled on 03/01/2024.   This fill date is pending response to refill request from provider. Patient is aware and if they have not received fill by intended date they must follow up with pharmacy.

## 2024-02-28 NOTE — Progress Notes (Unsigned)
 Office Visit Note  Patient: Samantha Graves             Date of Birth: 05/02/1971           MRN: [id number]             PCP: Pricilla Riffle, MD Referring: Pricilla Riffle, MD Visit Date: 02/29/2024 Occupation: @GUAROCC @  Subjective:  Medication monitoring   History of Present Illness: Samantha Graves is a 53 y.o. female with history of psoriatic arthritis.  Patient was started on tremfya on 10/06/23.   CBC and CMP updated on 09/22/23. Orders for CBC and CMP released today.   Activities of Daily Living:  Patient reports morning stiffness for *** {minute/hour:19697}.   Patient {ACTIONS;DENIES/REPORTS:21021675::"Denies"} nocturnal pain.  Difficulty dressing/grooming: {ACTIONS;DENIES/REPORTS:21021675::"Denies"} Difficulty climbing stairs: {ACTIONS;DENIES/REPORTS:21021675::"Denies"} Difficulty getting out of chair: {ACTIONS;DENIES/REPORTS:21021675::"Denies"} Difficulty using hands for taps, buttons, cutlery, and/or writing: {ACTIONS;DENIES/REPORTS:21021675::"Denies"}  No Rheumatology ROS completed.   PMFS History:  Patient Active Problem List   Diagnosis Date Noted   Leg length discrepancy 09/16/2022   Tear of left acetabular labrum 09/16/2022   Psoriasis 01/11/2017   High risk medication use 01/11/2017   PSORIATIC ARTHRITIS 10/14/2009   Allergic rhinitis 09/06/2007    Past Medical History:  Diagnosis Date   ADHD    Bile duct injury 2020   mild   Cholecystitis    Elevated LFTs    MGUS (monoclonal gammopathy of unknown significance) 2021   PONV (postoperative nausea and vomiting)    Psoriatic arthritis (HCC)    Rib fracture     Family History  Problem Relation Age of Onset   Hyperlipidemia Mother    Ovarian cysts Mother        benign   Endometriosis Mother    Liver disease Mother        NASH   Other Father        auto imune   Coronary artery disease Father        cardio ablation   COPD Maternal Grandmother    Bone cancer Maternal Grandfather 35    Arthritis/Rheumatoid Sister 76   Endometriosis Sister    Retinitis pigmentosa Sister 63   Ulcerative colitis Sister 21       surgical repair, J pouch   Ovarian cancer Sister 39       negative genetic testing   Liver disease Maternal Aunt        NASH   Colon cancer Neg Hx    Esophageal cancer Neg Hx    Pancreatic cancer Neg Hx    Stomach cancer Neg Hx    Past Surgical History:  Procedure Laterality Date   CHOLECYSTECTOMY N/A 08/13/2019   Procedure: LAPAROSCOPIC CHOLECYSTECTOMY;  Surgeon: Emelia Loron, MD;  Location: [facility] OR;  Service: General;  Laterality: N/A;   INTRAUTERINE DEVICE INSERTION  05/30/15, 05/06/10   MIrena IUD   TONSILLECTOMY AND ADENOIDECTOMY  1990   WISDOM TOOTH EXTRACTION Bilateral 1991   Social History   Social History Narrative   Not on file   Immunization History  Administered Date(s) Administered   Influenza Whole 08/30/2003   Influenza,inj,Quad PF,6+ Mos 09/01/2015, 09/14/2019, 11/14/2020   PFIZER(Purple Top)SARS-COV-2 Vaccination 02/09/2020, 03/03/2020, 09/02/2020   Tdap 02/25/2011, 02/17/2021     Objective: Vital Signs: There were no vitals taken for this visit.   Physical Exam Vitals and nursing note reviewed.  Constitutional:      Appearance: She is well-developed.  HENT:     Head: Normocephalic and atraumatic.  Eyes:     Conjunctiva/sclera: Conjunctivae normal.  Cardiovascular:     Rate and Rhythm: Normal rate and regular rhythm.     Heart sounds: Normal heart sounds.  Pulmonary:     Effort: Pulmonary effort is normal.     Breath sounds: Normal breath sounds.  Abdominal:     General: Bowel sounds are normal.     Palpations: Abdomen is soft.  Musculoskeletal:     Cervical back: Normal range of motion.  Lymphadenopathy:     Cervical: No cervical adenopathy.  Skin:    General: Skin is warm and dry.     Capillary Refill: Capillary refill takes less than 2 seconds.  Neurological:     Mental Status: She is alert and oriented to  person, place, and time.  Psychiatric:        Behavior: Behavior normal.      Musculoskeletal Exam: ***  CDAI Exam: CDAI Score: -- Patient Global: --; Provider Global: -- Swollen: --; Tender: -- Joint Exam 02/29/2024   No joint exam has been documented for this visit   There is currently no information documented on the homunculus. Go to the Rheumatology activity and complete the homunculus joint exam.  Investigation: No additional findings.  Imaging: No results found.  Recent Labs: Lab Results  Component Value Date   WBC 6.3 09/22/2023   HGB 13.4 09/22/2023   PLT 191 09/22/2023   NA 138 09/22/2023   K 4.3 09/22/2023   CL 103 09/22/2023   CO2 27 09/22/2023   GLUCOSE 104 (H) 09/22/2023   BUN 16 09/22/2023   CREATININE 0.81 09/22/2023   BILITOT 0.6 09/22/2023   ALKPHOS 41 03/17/2021   AST 17 09/22/2023   ALT 10 09/22/2023   PROT 6.4 09/22/2023   ALBUMIN 4.2 03/17/2021   CALCIUM 9.5 09/22/2023   GFRAA 107 03/16/2021   QFTBGOLD NEGATIVE 11/04/2017   QFTBGOLDPLUS NEGATIVE 12/02/2022    Speciality Comments: Enbrel, Humira-inadequate response Simponi-04/22 Leflunomide-patient did not to start as she was concerned about the side effects of elevated LFTs.  Procedures:  No procedures performed Allergies: Tamiflu [oseltamivir phosphate]   Assessment / Plan:     Visit Diagnoses: Psoriatic arthritis (HCC)  Psoriasis  High risk medication use  Pain in both hands  Chronic SI joint pain  Pain in left hip  Trochanteric bursitis of both hips  Pain in both feet  Plantar fasciitis  Monoclonal gammopathy  Elevated LFTs  Orders: No orders of the defined types were placed in this encounter.  No orders of the defined types were placed in this encounter.   Face-to-face time spent with patient was *** minutes. Greater than 50% of time was spent in counseling and coordination of care.  Follow-Up Instructions: No follow-ups on file.   Gearldine Bienenstock,  PA-C  Note - This record has been created using Dragon software.  Chart creation errors have been sought, but may not always  have been located. Such creation errors do not reflect on  the standard of medical care.

## 2024-02-29 ENCOUNTER — Other Ambulatory Visit: Payer: Self-pay

## 2024-02-29 ENCOUNTER — Encounter: Payer: Self-pay | Admitting: Physician Assistant

## 2024-02-29 ENCOUNTER — Ambulatory Visit: Attending: Physician Assistant | Admitting: Physician Assistant

## 2024-02-29 VITALS — BP 110/77 | HR 53 | Resp 14 | Ht 67.5 in | Wt 160.0 lb

## 2024-02-29 DIAGNOSIS — M79671 Pain in right foot: Secondary | ICD-10-CM

## 2024-02-29 DIAGNOSIS — L4050 Arthropathic psoriasis, unspecified: Secondary | ICD-10-CM | POA: Diagnosis not present

## 2024-02-29 DIAGNOSIS — M722 Plantar fascial fibromatosis: Secondary | ICD-10-CM | POA: Diagnosis not present

## 2024-02-29 DIAGNOSIS — L409 Psoriasis, unspecified: Secondary | ICD-10-CM

## 2024-02-29 DIAGNOSIS — Z79899 Other long term (current) drug therapy: Secondary | ICD-10-CM

## 2024-02-29 DIAGNOSIS — Z111 Encounter for screening for respiratory tuberculosis: Secondary | ICD-10-CM

## 2024-02-29 DIAGNOSIS — M7061 Trochanteric bursitis, right hip: Secondary | ICD-10-CM | POA: Diagnosis not present

## 2024-02-29 DIAGNOSIS — M79642 Pain in left hand: Secondary | ICD-10-CM

## 2024-02-29 DIAGNOSIS — D472 Monoclonal gammopathy: Secondary | ICD-10-CM

## 2024-02-29 DIAGNOSIS — R7989 Other specified abnormal findings of blood chemistry: Secondary | ICD-10-CM

## 2024-02-29 DIAGNOSIS — M79641 Pain in right hand: Secondary | ICD-10-CM

## 2024-02-29 DIAGNOSIS — G8929 Other chronic pain: Secondary | ICD-10-CM

## 2024-02-29 DIAGNOSIS — M533 Sacrococcygeal disorders, not elsewhere classified: Secondary | ICD-10-CM

## 2024-02-29 DIAGNOSIS — M25552 Pain in left hip: Secondary | ICD-10-CM

## 2024-02-29 DIAGNOSIS — M7062 Trochanteric bursitis, left hip: Secondary | ICD-10-CM

## 2024-02-29 DIAGNOSIS — M79672 Pain in left foot: Secondary | ICD-10-CM

## 2024-02-29 MED ORDER — METHOCARBAMOL 500 MG PO TABS: 500.0000 mg | ORAL_TABLET | Freq: Every day | ORAL | 0 refills | Status: AC | PRN

## 2024-02-29 MED ORDER — TREMFYA 100 MG/ML ~~LOC~~ SOAJ
100.0000 mg | SUBCUTANEOUS | 0 refills | Status: DC
  Filled 2024-02-29: qty 1, 56d supply, fill #0

## 2024-02-29 NOTE — Progress Notes (Signed)
 CBC WNL

## 2024-02-29 NOTE — Telephone Encounter (Signed)
 Please review and sign pended tremfya refill that patient requested today. Thanks!

## 2024-03-01 NOTE — Progress Notes (Signed)
 CMP WNL

## 2024-03-02 LAB — CBC WITH DIFFERENTIAL/PLATELET
Absolute Lymphocytes: 1220 {cells}/uL (ref 850–3900)
Absolute Monocytes: 402 {cells}/uL (ref 200–950)
Basophils Absolute: 39 {cells}/uL (ref 0–200)
Basophils Relative: 0.8 %
Eosinophils Absolute: 113 {cells}/uL (ref 15–500)
Eosinophils Relative: 2.3 %
HCT: 40.2 % (ref 35.0–45.0)
Hemoglobin: 13.5 g/dL (ref 11.7–15.5)
MCH: 32 pg (ref 27.0–33.0)
MCHC: 33.6 g/dL (ref 32.0–36.0)
MCV: 95.3 fL (ref 80.0–100.0)
MPV: 11.1 fL (ref 7.5–12.5)
Monocytes Relative: 8.2 %
Neutro Abs: 3126 {cells}/uL (ref 1500–7800)
Neutrophils Relative %: 63.8 %
Platelets: 209 10*3/uL (ref 140–400)
RBC: 4.22 10*6/uL (ref 3.80–5.10)
RDW: 11.6 % (ref 11.0–15.0)
Total Lymphocyte: 24.9 %
WBC: 4.9 10*3/uL (ref 3.8–10.8)

## 2024-03-02 LAB — COMPREHENSIVE METABOLIC PANEL WITH GFR
AG Ratio: 2.1 (calc) (ref 1.0–2.5)
ALT: 16 U/L (ref 6–29)
AST: 24 U/L (ref 10–35)
Albumin: 4.4 g/dL (ref 3.6–5.1)
Alkaline phosphatase (APISO): 42 U/L (ref 37–153)
BUN: 15 mg/dL (ref 7–25)
CO2: 29 mmol/L (ref 20–32)
Calcium: 9.2 mg/dL (ref 8.6–10.4)
Chloride: 104 mmol/L (ref 98–110)
Creat: 0.87 mg/dL (ref 0.50–1.03)
Globulin: 2.1 g/dL (ref 1.9–3.7)
Glucose, Bld: 96 mg/dL (ref 65–99)
Potassium: 4.6 mmol/L (ref 3.5–5.3)
Sodium: 138 mmol/L (ref 135–146)
Total Bilirubin: 0.4 mg/dL (ref 0.2–1.2)
Total Protein: 6.5 g/dL (ref 6.1–8.1)
eGFR: 80 mL/min/{1.73_m2} (ref >=60)

## 2024-03-02 LAB — QUANTIFERON-TB GOLD PLUS
Mitogen-NIL: 3.87 [IU]/mL
NIL: 0.03 [IU]/mL
QuantiFERON-TB Gold Plus: NEGATIVE
TB1-NIL: 0 [IU]/mL
TB2-NIL: 0 [IU]/mL

## 2024-03-03 NOTE — Progress Notes (Signed)
 TB gold negative

## 2024-04-17 ENCOUNTER — Other Ambulatory Visit: Payer: Self-pay | Admitting: Physician Assistant

## 2024-04-17 ENCOUNTER — Other Ambulatory Visit: Payer: Self-pay

## 2024-04-17 DIAGNOSIS — L409 Psoriasis, unspecified: Secondary | ICD-10-CM

## 2024-04-17 DIAGNOSIS — Z79899 Other long term (current) drug therapy: Secondary | ICD-10-CM

## 2024-04-17 DIAGNOSIS — L4050 Arthropathic psoriasis, unspecified: Secondary | ICD-10-CM

## 2024-04-18 ENCOUNTER — Other Ambulatory Visit: Payer: Self-pay

## 2024-04-18 MED ORDER — TREMFYA ONE-PRESS 100 MG/ML ~~LOC~~ SOAJ
100.0000 mg | SUBCUTANEOUS | 1 refills | Status: DC
  Filled 2024-04-18 – 2024-04-26 (×2): qty 1, 56d supply, fill #0
  Filled 2024-06-14 – 2024-07-09 (×3): qty 1, 56d supply, fill #1

## 2024-04-19 ENCOUNTER — Other Ambulatory Visit: Payer: Self-pay

## 2024-04-26 ENCOUNTER — Other Ambulatory Visit: Payer: Self-pay

## 2024-04-26 NOTE — Progress Notes (Signed)
 Specialty Pharmacy Refill Coordination Note  Samantha Graves is a 53 y.o. female contacted today regarding refills of specialty medication(s) Guselkumab  (Tremfya  [employer])   Patient requested Delivery   Delivery date: 05/01/24   Verified address: Patient address [address]  [place] [address]   Medication will be filled on 06.02.25.    This medication requires a new prior authorization and is currently being processed by the PA team. Specialty pharmacy team will contact patient if any delays arise.

## 2024-04-27 ENCOUNTER — Telehealth: Payer: Self-pay

## 2024-04-27 NOTE — Telephone Encounter (Signed)
 Received notification from [facility] pharmacy that patient requires a new authorization for their medication.  Submitted an URGENT Prior Authorization request to [facility] for TREMFYA  via CoverMyMeds. Will update once we receive a response.  Key: [id number]

## 2024-04-30 ENCOUNTER — Other Ambulatory Visit: Payer: Self-pay

## 2024-04-30 NOTE — Telephone Encounter (Signed)
 Received notification from [facility] regarding a prior authorization for TREMFYA . Authorization has been APPROVED from 04/27/2024 to 04/26/2025. Approval letter sent to scan center.  Patient must continue to fill through [facility]: [phone]   Authorization # [id number]  Geraldene Kleine, PharmD, MPH, BCPS, CPP Clinical Pharmacist (Rheumatology and Pulmonology)

## 2024-05-16 NOTE — Progress Notes (Unsigned)
 Office Visit Note  Patient: Samantha Graves             Date of Birth: 1971-04-04           MRN: [id number]             PCP: Okey Vina GAILS, MD Referring: Okey Vina GAILS, MD Visit Date: 05/30/2024 Occupation: @GUAROCC @  Subjective:  Left index finger pain   History of Present Illness: Alexxus June Traynor is a 53 y.o. female with history of psoriatic arthritis.  Tremfya  was initiated on 10/06/2023. She remains on Tremfya  100 mg sq inj once every 8 weeks.  She is tolerating Tremfya  without any side effects or injection site reactions.  Patient has noticed an improvement in her symptoms since her last office visit.  The psoriasis on her scalp has improved.  Patient states that her left hip pain has resolved since completing dry needling.  She has started to have similar symptoms on the right side of her hip and will be going for dry needling in August 2025.  She has not ran in several days due to the discomfort in the right hip.  She has been continuing to go to Pilates.  She denies any SI joint pain.  She denies any Achilles tendinitis or plantar fasciitis. Patient reports that her primary concern remains pain and stiffness in the left index finger.  She has been having to make annotations due to the discomfort in the left index finger.  Patient states that the pain has been constant but is particularly worse at night.  The pain and inflammation tend to subside throughout the day. Patient is currently taking Augmentin  for an ear infection and sinus congestion.  She denies any recurrent infections.     Activities of Daily Living:  Patient reports morning stiffness for 1 hour.   Patient Reports nocturnal pain.  Difficulty dressing/grooming: Denies Difficulty climbing stairs: Denies Difficulty getting out of chair: Reports Difficulty using hands for taps, buttons, cutlery, and/or writing: Denies  Review of Systems  Constitutional:  Negative for fatigue.  HENT:  Negative for mouth sores  and mouth dryness.   Eyes:  Negative for dryness.  Respiratory:  Negative for shortness of breath.   Cardiovascular:  Negative for chest pain and palpitations.  Gastrointestinal:  Negative for blood in stool, constipation and diarrhea.  Endocrine: Negative for increased urination.  Genitourinary:  Negative for involuntary urination.  Musculoskeletal:  Positive for joint pain, joint pain, joint swelling, morning stiffness and muscle tenderness. Negative for gait problem, myalgias, muscle weakness and myalgias.  Skin:  Negative for color change, rash, hair loss and sensitivity to sunlight.  Allergic/Immunologic: Negative for susceptible to infections.  Neurological:  Negative for dizziness and headaches.  Hematological:  Negative for swollen glands.  Psychiatric/Behavioral:  Negative for depressed mood and sleep disturbance. The patient is not nervous/anxious.     PMFS History:  Patient Active Problem List   Diagnosis Date Noted   Leg length discrepancy 09/16/2022   Tear of left acetabular labrum 09/16/2022   Psoriasis 01/11/2017   High risk medication use 01/11/2017   PSORIATIC ARTHRITIS 10/14/2009   Allergic rhinitis 09/06/2007    Past Medical History:  Diagnosis Date   ADHD    Bile duct injury 2020   mild   Cholecystitis    Elevated LFTs    MGUS (monoclonal gammopathy of unknown significance) 2021   PONV (postoperative nausea and vomiting)    Psoriatic arthritis (HCC)    Rib fracture  Family History  Problem Relation Age of Onset   Hyperlipidemia Mother    Ovarian cysts Mother        benign   Endometriosis Mother    Liver disease Mother        NASH   Other Father        auto imune   Coronary artery disease Father        cardio ablation   COPD Maternal Grandmother    Bone cancer Maternal Grandfather 45   Arthritis/Rheumatoid Sister 20   Endometriosis Sister    Retinitis pigmentosa Sister 4   Ulcerative colitis Sister 21       surgical repair, J pouch    Ovarian cancer Sister 21       negative genetic testing   Liver disease Maternal Aunt        NASH   Colon cancer Neg Hx    Esophageal cancer Neg Hx    Pancreatic cancer Neg Hx    Stomach cancer Neg Hx    Past Surgical History:  Procedure Laterality Date   CHOLECYSTECTOMY N/A 08/13/2019   Procedure: LAPAROSCOPIC CHOLECYSTECTOMY;  Surgeon: Ebbie Cough, MD;  Location: [facility] OR;  Service: General;  Laterality: N/A;   INTRAUTERINE DEVICE INSERTION  05/30/15, 05/06/10   MIrena  IUD   TONSILLECTOMY AND ADENOIDECTOMY  1990   WISDOM TOOTH EXTRACTION Bilateral 1991   Social History   Social History Narrative   Not on file   Immunization History  Administered Date(s) Administered   Influenza Whole 08/30/2003   Influenza,inj,Quad PF,6+ Mos 09/01/2015, 09/14/2019, 11/14/2020   PFIZER(Purple Top)SARS-COV-2 Vaccination 02/09/2020, 03/03/2020, 09/02/2020   Tdap 02/25/2011, 02/17/2021     Objective: Vital Signs: BP 113/77 (BP Location: Left Arm, Patient Position: Sitting, Cuff Size: Normal)   Pulse 62   Resp 16   Ht 5' 7.5 (1.715 m)   Wt 155 lb 12.8 oz (70.7 kg)   BMI 24.04 kg/m    Physical Exam Vitals and nursing note reviewed.  Constitutional:      Appearance: She is well-developed.  HENT:     Head: Normocephalic and atraumatic.  Eyes:     Conjunctiva/sclera: Conjunctivae normal.  Cardiovascular:     Rate and Rhythm: Normal rate and regular rhythm.     Heart sounds: Normal heart sounds.  Pulmonary:     Effort: Pulmonary effort is normal.     Breath sounds: Normal breath sounds.  Abdominal:     General: Bowel sounds are normal.     Palpations: Abdomen is soft.  Musculoskeletal:     Cervical back: Normal range of motion.  Lymphadenopathy:     Cervical: No cervical adenopathy.  Skin:    General: Skin is warm and dry.     Capillary Refill: Capillary refill takes less than 2 seconds.  Neurological:     Mental Status: She is alert and oriented to person, place, and time.   Psychiatric:        Behavior: Behavior normal.      Musculoskeletal Exam: C-spine, thoracic spine, lumbar spine good range of motion.  No midline spinal tenderness.  No SI joint tenderness.  Shoulder joints, elbow joints, and wrist joints have good range of motion with no synovitis.  Left index PIP joint has slightly limited extension with tenderness upon palpation.  Tenderness of the left second MCP joint.  Mild DIP prominence.  Difficulty making a full fist with the left hand due to the pain and stiffness in her left index finger.  Hip  joints have good range of motion with no groin pain currently.  Knee joints have good range of motion no warmth or effusion.  Ankle joints have good range of motion with no tenderness or joint swelling.  No evidence of Achilles tendinitis or plantar fasciitis.  Prominence and thickening of the left first MTP joint with bunion formation noted.  CDAI Exam: CDAI Score: -- Patient Global: --; Provider Global: -- Swollen: 0 ; Tender: 2  Joint Exam 05/30/2024      Right  Left  MCP 2      Tender  PIP 2 (finger)      Tender     Investigation: No additional findings.  Imaging: No results found.  Recent Labs: Lab Results  Component Value Date   WBC 4.9 02/29/2024   HGB 13.5 02/29/2024   PLT 209 02/29/2024   NA 138 02/29/2024   K 4.6 02/29/2024   CL 104 02/29/2024   CO2 29 02/29/2024   GLUCOSE 96 02/29/2024   BUN 15 02/29/2024   CREATININE 0.87 02/29/2024   BILITOT 0.4 02/29/2024   ALKPHOS 41 03/17/2021   AST 24 02/29/2024   ALT 16 02/29/2024   PROT 6.5 02/29/2024   ALBUMIN 4.2 03/17/2021   CALCIUM 9.2 02/29/2024   GFRAA 107 03/16/2021   QFTBGOLD NEGATIVE 11/04/2017   QFTBGOLDPLUS NEGATIVE 02/29/2024    Speciality Comments: Enbrel, Humira -inadequate response Simponi -04/22 Leflunomide -patient did not to start as she was concerned about the side effects of elevated LFTs.  Procedures:  No procedures performed Allergies: Tamiflu  [oseltamivir phosphate]   Assessment / Plan:     Visit Diagnoses: Psoriatic arthritis (HCC): No synovitis or dactylitis noted on examination today.  Patient presents today with ongoing pain and stiffness in the left index finger.  She has tenderness over the left second MCP and PIP joint with limited extension of the second PIP joint.  She has difficulty making a full fist with the left hand due to the pain and stiffness in the left index finger.  She has been experiencing nocturnal pain in the left index finger and has had to make adaptations while performing ADLs. Patient remains on Tremfya  100 mg subcutaneous injections every 8 weeks.  She is tolerating Tremfya  without any side effects or injection site reactions.  Her psoriasis has started to clear.  She has no Achilles tendinitis or plantar fasciitis.  No SI joint tenderness upon palpation.  Overall she has noticed an improvement in her symptoms since initiating Tremfya .  No medication changes will be made at this time.  For management of pain involving the left index finger we plan to schedule an ultrasound-guided injection to help alleviate her symptoms.  She will follow-up in the office in 3 months for routine follow up or sooner if needed.  Psoriasis: Scalp/ears--improving.  Not currently using any topical agents.  She will remain on Tremfya  as prescribed.  High risk medication use - Tremfya  was initiated on 10/06/2023. Tremfya  100 mg sq inj once every 8 weeks. Previous therapy includes: Enbrel, Humira , Simponi .  History of elevated LFTs.  TB gold negative on 02/29/24.  CBC and CMP updated on 02/29/24.  Orders for CBC and CMP released today.  Her next lab work will be due in October and every 3 months to monitor for drug toxicity. No recurrent infections.  Discussed the importance of holding tremfya  if she develops signs or symptoms of an infection and to resume once the infection has completely cleared. - Plan: CBC with Differential/Platelet,  Comprehensive  metabolic panel with GFR  Pain in both hands - X-rays performed on 12/02/2022 which were consistent with psoriatic arthritis and osteoarthritis overlap.  No radiographic progression compared to 2019.  She presents today with ongoing pain in the left index finger.  She has tenderness over the left second MCP and PIP joint.  Difficulty making complete fist with the left hand due to the pain and stiffness she has been experiencing.  She has been experiencing nocturnal pain.  Different treatment options were discussed.  She declined oral prednisone .  Plan to schedule an ultrasound-guided injection to help alleviate her symptoms.  Chronic SI joint pain: No SI joint tenderness upon palpation.  Pain in left hip - Evaluated by Dr. Chick.  Improved with dry needling performed at [facility].   Trochanteric bursitis of both hips: Left hip pain has improved with dry needling.  She has been experiencing new onset right-sided hip pain and will be following up at [facility] for physical therapy and dry needling starting in August.   Pain in both feet - X-rays osteoarthritic changes. A cyst or erosion was noted in the left fifth metatarsal head.  Bunion formation noted over the left first MTP joint.  No tenderness or synovitis noted. No evidence of achilles tendonitis or plantar fasciitis.   Plantar fasciitis: Not currently symptomatic.    Other medical conditions are listed as follows:   Monoclonal gammopathy  Elevated LFTs: LFTs WNL on 02/29/24.   Orders: Orders Placed This Encounter  Procedures   CBC with Differential/Platelet   Comprehensive metabolic panel with GFR   No orders of the defined types were placed in this encounter.   Follow-Up Instructions: Return in about 3 months (around 08/30/2024) for Psoriatic arthritis.   Waddell CHRISTELLA Craze, PA-C  Note - This record has been created using Dragon software.  Chart creation errors have been sought, but may not always  have been located. Such  creation errors do not reflect on  the standard of medical care.

## 2024-05-30 ENCOUNTER — Encounter: Payer: Self-pay | Admitting: Physician Assistant

## 2024-05-30 ENCOUNTER — Ambulatory Visit: Attending: Physician Assistant | Admitting: Physician Assistant

## 2024-05-30 VITALS — BP 113/77 | HR 62 | Resp 16 | Ht 67.5 in | Wt 155.8 lb

## 2024-05-30 DIAGNOSIS — Z79899 Other long term (current) drug therapy: Secondary | ICD-10-CM | POA: Diagnosis not present

## 2024-05-30 DIAGNOSIS — L4050 Arthropathic psoriasis, unspecified: Secondary | ICD-10-CM | POA: Diagnosis not present

## 2024-05-30 DIAGNOSIS — M533 Sacrococcygeal disorders, not elsewhere classified: Secondary | ICD-10-CM

## 2024-05-30 DIAGNOSIS — M25552 Pain in left hip: Secondary | ICD-10-CM | POA: Diagnosis not present

## 2024-05-30 DIAGNOSIS — M7061 Trochanteric bursitis, right hip: Secondary | ICD-10-CM

## 2024-05-30 DIAGNOSIS — D472 Monoclonal gammopathy: Secondary | ICD-10-CM | POA: Diagnosis not present

## 2024-05-30 DIAGNOSIS — M722 Plantar fascial fibromatosis: Secondary | ICD-10-CM

## 2024-05-30 DIAGNOSIS — R7989 Other specified abnormal findings of blood chemistry: Secondary | ICD-10-CM

## 2024-05-30 DIAGNOSIS — M7062 Trochanteric bursitis, left hip: Secondary | ICD-10-CM

## 2024-05-30 DIAGNOSIS — M79671 Pain in right foot: Secondary | ICD-10-CM

## 2024-05-30 DIAGNOSIS — M79641 Pain in right hand: Secondary | ICD-10-CM | POA: Diagnosis not present

## 2024-05-30 DIAGNOSIS — L409 Psoriasis, unspecified: Secondary | ICD-10-CM | POA: Diagnosis not present

## 2024-05-30 DIAGNOSIS — M79672 Pain in left foot: Secondary | ICD-10-CM

## 2024-05-30 DIAGNOSIS — M79642 Pain in left hand: Secondary | ICD-10-CM

## 2024-05-30 DIAGNOSIS — G8929 Other chronic pain: Secondary | ICD-10-CM

## 2024-05-30 LAB — COMPREHENSIVE METABOLIC PANEL WITH GFR
AG Ratio: 2 (calc) (ref 1.0–2.5)
ALT: 12 U/L (ref 6–29)
AST: 18 U/L (ref 10–35)
Albumin: 4.4 g/dL (ref 3.6–5.1)
Alkaline phosphatase (APISO): 46 U/L (ref 37–153)
BUN: 17 mg/dL (ref 7–25)
CO2: 31 mmol/L (ref 20–32)
Calcium: 9.4 mg/dL (ref 8.6–10.4)
Chloride: 101 mmol/L (ref 98–110)
Creat: 0.76 mg/dL (ref 0.50–1.03)
Globulin: 2.2 g/dL (ref 1.9–3.7)
Glucose, Bld: 77 mg/dL (ref 65–99)
Potassium: 4.7 mmol/L (ref 3.5–5.3)
Sodium: 137 mmol/L (ref 135–146)
Total Bilirubin: 0.5 mg/dL (ref 0.2–1.2)
Total Protein: 6.6 g/dL (ref 6.1–8.1)
eGFR: 94 mL/min/{1.73_m2} (ref >=60)

## 2024-05-30 LAB — CBC WITH DIFFERENTIAL/PLATELET
Absolute Lymphocytes: 1217 {cells}/uL (ref 850–3900)
Absolute Monocytes: 432 {cells}/uL (ref 200–950)
Basophils Absolute: 42 {cells}/uL (ref 0–200)
Basophils Relative: 0.8 %
Eosinophils Absolute: 120 {cells}/uL (ref 15–500)
Eosinophils Relative: 2.3 %
HCT: 44.4 % (ref 35.0–45.0)
Hemoglobin: 14.6 g/dL (ref 11.7–15.5)
MCH: 31.9 pg (ref 27.0–33.0)
MCHC: 32.9 g/dL (ref 32.0–36.0)
MCV: 97.2 fL (ref 80.0–100.0)
MPV: 10.8 fL (ref 7.5–12.5)
Monocytes Relative: 8.3 %
Neutro Abs: 3390 {cells}/uL (ref 1500–7800)
Neutrophils Relative %: 65.2 %
Platelets: 209 10*3/uL (ref 140–400)
RBC: 4.57 10*6/uL (ref 3.80–5.10)
RDW: 11.4 % (ref 11.0–15.0)
Total Lymphocyte: 23.4 %
WBC: 5.2 10*3/uL (ref 3.8–10.8)

## 2024-05-30 NOTE — Patient Instructions (Signed)
 Standing Labs We placed an order today for your standing lab work.   Please have your standing labs drawn in October and every 3 months   Please have your labs drawn 2 weeks prior to your appointment so that the provider can discuss your lab results at your appointment, if possible.  Please note that you may see your imaging and lab results in MyChart before we have reviewed them. We will contact you once all results are reviewed. Please allow our office up to 72 hours to thoroughly review all of the results before contacting the office for clarification of your results.  WALK-IN LAB HOURS  Monday through Thursday from 8:00 am -12:30 pm and 1:00 pm-4:30 pm and Friday from 8:00 am-12:00 pm.  Patients with office visits requiring labs will be seen before walk-in labs.  You may encounter longer than normal wait times. Please allow additional time. Wait times may be shorter on  Monday and Thursday afternoons.  We do not book appointments for walk-in labs. We appreciate your patience and understanding with our staff.   Labs are drawn by [facility]. Please bring your co-pay at the time of your lab draw.  You may receive a bill from [facility] for your lab work.  Please note if you are on Hydroxychloroquine and and an order has been placed for a Hydroxychloroquine level,  you will need to have it drawn 4 hours or more after your last dose.  If you wish to have your labs drawn at another location, please call the office 24 hours in advance so we can fax the orders.  The office is located at [address], [place], [place] [address]   If you have any questions regarding directions or hours of operation,  please call [phone].   As a reminder, please drink plenty of water  prior to coming for your lab work. Thanks!

## 2024-05-31 ENCOUNTER — Ambulatory Visit

## 2024-05-31 ENCOUNTER — Ambulatory Visit: Payer: Self-pay | Admitting: Physician Assistant

## 2024-05-31 ENCOUNTER — Encounter: Payer: Self-pay | Admitting: Rheumatology

## 2024-05-31 ENCOUNTER — Ambulatory Visit: Attending: Rheumatology | Admitting: Rheumatology

## 2024-05-31 DIAGNOSIS — M79642 Pain in left hand: Secondary | ICD-10-CM

## 2024-05-31 DIAGNOSIS — L4050 Arthropathic psoriasis, unspecified: Secondary | ICD-10-CM

## 2024-05-31 DIAGNOSIS — M79641 Pain in right hand: Secondary | ICD-10-CM

## 2024-05-31 MED ORDER — LIDOCAINE HCL 1 % IJ SOLN
0.5000 mL | INTRAMUSCULAR | Status: AC | PRN
  Administered 2024-05-31: .5 mL

## 2024-05-31 MED ORDER — TRIAMCINOLONE ACETONIDE 40 MG/ML IJ SUSP
20.0000 mg | INTRAMUSCULAR | Status: AC | PRN
  Administered 2024-05-31: 20 mg via INTRA_ARTICULAR

## 2024-05-31 NOTE — Progress Notes (Signed)
   Procedure Note  Patient: Kiaya Haliburton Stachnik             Date of Birth: 30-Sep-1971           MRN: [id number]             Visit Date: 05/31/2024  Procedures: Visit Diagnoses:  1. Pain in both hands   2. Psoriatic arthritis (HCC)   Ultrasound guided injection is preferred based studies that show increased duration, increased effect, greater accuracy, decreased procedural pain, increased response rate, and decreased cost with ultrasound guided versus blind injection.   Verbal informed consent obtained.  Time-out conducted.  Noted no overlying erythema, induration, or other signs of local infection. Ultrasound-guided left second MCP injection: After sterile prep with Betadine, injected 0.5 mL of 1% lidocaine  and 10 mg Kenalog  using a 27-gauge needle, and the MCP joint.    Small Joint Inj: L index MCP on 05/31/2024 11:47 AM Indications: pain and joint swelling Details: 27 G needle, ultrasound-guided dorsal approach Medications: 0.5 mL lidocaine  1 %; 20 mg triamcinolone  acetonide 40 MG/ML Aspirate: 0 mL  Risk of infection, tendon injury, nerve injury, hypopigmentation and dermal atrophy were discussed. Procedure, treatment alternatives, risks and benefits explained, specific risks discussed. Consent was given by the patient. Immediately prior to procedure a time out was called to verify the correct patient, procedure, equipment, support staff and site/side marked as required. Patient was prepped and draped in the usual sterile fashion.    Patient tolerated the procedure well.  Postprocedure instructions were given.  Maya Nash, MD

## 2024-05-31 NOTE — Progress Notes (Signed)
 CBC and CMP WNL

## 2024-06-06 ENCOUNTER — Other Ambulatory Visit: Payer: Self-pay

## 2024-06-12 ENCOUNTER — Other Ambulatory Visit (HOSPITAL_COMMUNITY): Payer: Self-pay

## 2024-06-14 ENCOUNTER — Other Ambulatory Visit: Payer: Self-pay

## 2024-06-14 ENCOUNTER — Other Ambulatory Visit: Payer: Self-pay | Admitting: Pharmacy Technician

## 2024-06-15 ENCOUNTER — Telehealth: Payer: Self-pay

## 2024-06-15 ENCOUNTER — Other Ambulatory Visit (HOSPITAL_COMMUNITY): Payer: Self-pay

## 2024-06-15 NOTE — Telephone Encounter (Signed)
 Received notification from [facility] that no insurance was populating for pt while running an eligibility check. Attempted to run a test claim through current [employer], which rejects stating that pt is not covered.  Attempted to contact pt to discuss, left VoiceMail requesting a return call. Direct office number provided.

## 2024-06-18 ENCOUNTER — Other Ambulatory Visit (HOSPITAL_COMMUNITY): Payer: Self-pay

## 2024-06-18 NOTE — Telephone Encounter (Signed)
 Called patient regarding insurance coverage. Unable to reach. Left a voicemail. Advised to call back and provided phone number: [phone].  Deleta Colt PharmD Candidate [phone]  [facility]

## 2024-06-18 NOTE — Progress Notes (Signed)
 We have called patient. Samantha Graves called on Friday and we called patient this morning from rheum

## 2024-06-19 ENCOUNTER — Other Ambulatory Visit: Payer: Self-pay

## 2024-06-21 ENCOUNTER — Other Ambulatory Visit (HOSPITAL_COMMUNITY): Payer: Self-pay

## 2024-06-26 NOTE — Progress Notes (Signed)
 SP enrollment placed on pause  Chia Rock, PharmD, MPH, BCPS, CPP Clinical Pharmacist (Rheumatology and Pulmonology)

## 2024-06-26 NOTE — Telephone Encounter (Addendum)
 Attempted to call patient regarding insurance status. Unable to reach again. Left VM requesting call back to clinic. Advised that pharmacy outreach will be closed out. She will need to call clinic back with update.  Letter sent to home. If no f/u from patient by 07/13/24, will close encounter.  Sherry Pennant, PharmD, MPH, BCPS, CPP Clinical Pharmacist (Rheumatology and Pulmonology)

## 2024-07-09 ENCOUNTER — Other Ambulatory Visit (HOSPITAL_COMMUNITY): Payer: Self-pay

## 2024-07-09 ENCOUNTER — Other Ambulatory Visit: Payer: Self-pay

## 2024-07-09 NOTE — Progress Notes (Signed)
 Specialty Pharmacy Ongoing Clinical Assessment Note  Samantha Graves is a 53 y.o. female who is being followed by the specialty pharmacy service for RxSp Psoriatic Arthritis   Patient's specialty medication(s) reviewed today: Guselkumab  (Tremfya  One-Press)   Missed doses in the last 4 weeks: 1   Patient/Caregiver did not have any additional questions or concerns.   Therapeutic benefit summary: Patient is achieving benefit   Adverse events/side effects summary: No adverse events/side effects   Patient's therapy is appropriate to: Continue    Goals Addressed             This Visit's Progress    Comply with lab assessments   On track    Patient is on track. Patient will adhere to provider and/or lab appointments      Maintain optimal adherence to therapy       Patient is on track. Patient will maintain adherence         Follow up: 12 months  [facility]

## 2024-07-09 NOTE — Progress Notes (Signed)
 Specialty Pharmacy Refill Coordination Note  Samantha Graves is a 53 y.o. female contacted today regarding refills of specialty medication(s) Guselkumab  (Tremfya  One-Press)   Patient requested [name] at [facility] at [place] date: 07/10/24   Medication will be filled on 07/09/24 or 07/10/24.

## 2024-07-13 NOTE — Telephone Encounter (Signed)
 Closing due to lack of f/u from patient.

## 2024-08-07 ENCOUNTER — Telehealth: Payer: Self-pay | Admitting: *Deleted

## 2024-08-07 ENCOUNTER — Telehealth: Payer: Self-pay | Admitting: Internal Medicine

## 2024-08-07 DIAGNOSIS — U071 COVID-19: Secondary | ICD-10-CM

## 2024-08-07 NOTE — Telephone Encounter (Signed)
 PT is COVID +   On immunosuppressive agents  Please call in Paxlovid    Standard dose pack to pharmacy   Fill 1  No refills

## 2024-08-07 NOTE — Telephone Encounter (Signed)
 Patient contacted the office and left message requesting a prescription for Paxlovid . Patient states she tested positive this morning for Covid and is symptomatic.  Attempted to contact the patient and left message to advise patient we do not prescribe Paxlovid  and she will need to contact her PCP.

## 2024-08-08 ENCOUNTER — Other Ambulatory Visit (HOSPITAL_COMMUNITY): Payer: Self-pay

## 2024-08-08 ENCOUNTER — Other Ambulatory Visit: Payer: Self-pay

## 2024-08-08 MED ORDER — PAXLOVID (300/100) 20 X 150 MG & 10 X 100MG PO TBPK
3.0000 | ORAL_TABLET | Freq: Two times a day (BID) | ORAL | 0 refills | Status: AC
  Filled 2024-08-08: qty 30, 5d supply, fill #0

## 2024-08-08 NOTE — Addendum Note (Signed)
 Addended by: DARRELL BRUCKNER on: 08/08/2024 08:40 AM   Modules accepted: Orders

## 2024-08-08 NOTE — Telephone Encounter (Signed)
 Paxlovid  Rx sent to [facility]

## 2024-08-09 NOTE — Telephone Encounter (Signed)
 Left message for patient informing her Rx for Paxlovid  has been sent to [facility]. Provided office number for callback if any questions.

## 2024-08-13 DIAGNOSIS — N951 Menopausal and female climacteric states: Secondary | ICD-10-CM | POA: Diagnosis not present

## 2024-08-13 DIAGNOSIS — R5383 Other fatigue: Secondary | ICD-10-CM | POA: Diagnosis not present

## 2024-08-13 DIAGNOSIS — E559 Vitamin D deficiency, unspecified: Secondary | ICD-10-CM | POA: Diagnosis not present

## 2024-08-13 DIAGNOSIS — E538 Deficiency of other specified B group vitamins: Secondary | ICD-10-CM | POA: Diagnosis not present

## 2024-08-13 DIAGNOSIS — Z1329 Encounter for screening for other suspected endocrine disorder: Secondary | ICD-10-CM | POA: Diagnosis not present

## 2024-08-13 DIAGNOSIS — R635 Abnormal weight gain: Secondary | ICD-10-CM | POA: Diagnosis not present

## 2024-08-13 DIAGNOSIS — R61 Generalized hyperhidrosis: Secondary | ICD-10-CM | POA: Diagnosis not present

## 2024-08-31 ENCOUNTER — Other Ambulatory Visit: Payer: Self-pay | Admitting: Physician Assistant

## 2024-08-31 ENCOUNTER — Other Ambulatory Visit: Payer: Self-pay

## 2024-08-31 ENCOUNTER — Other Ambulatory Visit (HOSPITAL_COMMUNITY): Payer: Self-pay

## 2024-08-31 DIAGNOSIS — L4050 Arthropathic psoriasis, unspecified: Secondary | ICD-10-CM

## 2024-08-31 DIAGNOSIS — L409 Psoriasis, unspecified: Secondary | ICD-10-CM

## 2024-08-31 DIAGNOSIS — Z79899 Other long term (current) drug therapy: Secondary | ICD-10-CM

## 2024-08-31 NOTE — Progress Notes (Signed)
 LVM. RR DENIED. PATIENT SHOULD CONTACT PROVIDER FIRST.

## 2024-08-31 NOTE — Progress Notes (Signed)
 Specialty Pharmacy Refill Coordination Note  Samantha Graves is a 53 y.o. female contacted today regarding refills of specialty medication(s) Guselkumab  (Tremfya  One-Press)   Patient requested Delivery   Delivery date: 09/04/24   Verified address: Patient address [address]  [place] [address]   Medication will be filled on 09/03/24.     This fill date is pending response to refill request from provider. Patient is aware and if they have not received fill by intended date they must follow up with pharmacy. Patient has appointment 10/07, and is aware that refills may not be approved until after appointment.

## 2024-09-04 ENCOUNTER — Other Ambulatory Visit: Payer: Self-pay

## 2024-09-04 ENCOUNTER — Ambulatory Visit: Attending: Physician Assistant | Admitting: Physician Assistant

## 2024-09-04 ENCOUNTER — Encounter: Payer: Self-pay | Admitting: Physician Assistant

## 2024-09-04 ENCOUNTER — Other Ambulatory Visit (HOSPITAL_COMMUNITY): Payer: Self-pay

## 2024-09-04 VITALS — BP 120/79 | HR 69 | Temp 97.5°F | Resp 16 | Ht 67.5 in | Wt 160.2 lb

## 2024-09-04 DIAGNOSIS — M25552 Pain in left hip: Secondary | ICD-10-CM | POA: Diagnosis not present

## 2024-09-04 DIAGNOSIS — M722 Plantar fascial fibromatosis: Secondary | ICD-10-CM

## 2024-09-04 DIAGNOSIS — M533 Sacrococcygeal disorders, not elsewhere classified: Secondary | ICD-10-CM

## 2024-09-04 DIAGNOSIS — D472 Monoclonal gammopathy: Secondary | ICD-10-CM | POA: Diagnosis not present

## 2024-09-04 DIAGNOSIS — R7989 Other specified abnormal findings of blood chemistry: Secondary | ICD-10-CM

## 2024-09-04 DIAGNOSIS — M7061 Trochanteric bursitis, right hip: Secondary | ICD-10-CM

## 2024-09-04 DIAGNOSIS — G8929 Other chronic pain: Secondary | ICD-10-CM

## 2024-09-04 DIAGNOSIS — M79641 Pain in right hand: Secondary | ICD-10-CM | POA: Diagnosis not present

## 2024-09-04 DIAGNOSIS — M79642 Pain in left hand: Secondary | ICD-10-CM

## 2024-09-04 DIAGNOSIS — L4050 Arthropathic psoriasis, unspecified: Secondary | ICD-10-CM

## 2024-09-04 DIAGNOSIS — M79671 Pain in right foot: Secondary | ICD-10-CM

## 2024-09-04 DIAGNOSIS — Z79899 Other long term (current) drug therapy: Secondary | ICD-10-CM

## 2024-09-04 DIAGNOSIS — M7062 Trochanteric bursitis, left hip: Secondary | ICD-10-CM

## 2024-09-04 DIAGNOSIS — L409 Psoriasis, unspecified: Secondary | ICD-10-CM

## 2024-09-04 DIAGNOSIS — M79672 Pain in left foot: Secondary | ICD-10-CM

## 2024-09-04 LAB — COMPREHENSIVE METABOLIC PANEL WITH GFR
AG Ratio: 2 (calc) (ref 1.0–2.5)
ALT: 14 U/L (ref 6–29)
AST: 18 U/L (ref 10–35)
Albumin: 4.3 g/dL (ref 3.6–5.1)
Alkaline phosphatase (APISO): 42 U/L (ref 37–153)
BUN: 18 mg/dL (ref 7–25)
CO2: 29 mmol/L (ref 20–32)
Calcium: 9.8 mg/dL (ref 8.6–10.4)
Chloride: 105 mmol/L (ref 98–110)
Creat: 0.88 mg/dL (ref 0.50–1.03)
Globulin: 2.2 g/dL (ref 1.9–3.7)
Glucose, Bld: 85 mg/dL (ref 65–99)
Potassium: 4.7 mmol/L (ref 3.5–5.3)
Sodium: 138 mmol/L (ref 135–146)
Total Bilirubin: 0.4 mg/dL (ref 0.2–1.2)
Total Protein: 6.5 g/dL (ref 6.1–8.1)
eGFR: 79 mL/min/1.73m2 (ref >=60)

## 2024-09-04 LAB — CBC WITH DIFFERENTIAL/PLATELET
Absolute Lymphocytes: 1285 {cells}/uL (ref 850–3900)
Absolute Monocytes: 459 {cells}/uL (ref 200–950)
Basophils Absolute: 31 {cells}/uL (ref 0–200)
Basophils Relative: 0.6 %
Eosinophils Absolute: 71 {cells}/uL (ref 15–500)
Eosinophils Relative: 1.4 %
HCT: 39.6 % (ref 35.0–45.0)
Hemoglobin: 13.1 g/dL (ref 11.7–15.5)
MCH: 32 pg (ref 27.0–33.0)
MCHC: 33.1 g/dL (ref 32.0–36.0)
MCV: 96.8 fL (ref 80.0–100.0)
MPV: 10.9 fL (ref 7.5–12.5)
Monocytes Relative: 9 %
Neutro Abs: 3254 {cells}/uL (ref 1500–7800)
Neutrophils Relative %: 63.8 %
Platelets: 202 Thousand/uL (ref 140–400)
RBC: 4.09 Million/uL (ref 3.80–5.10)
RDW: 11.4 % (ref 11.0–15.0)
Total Lymphocyte: 25.2 %
WBC: 5.1 Thousand/uL (ref 3.8–10.8)

## 2024-09-04 MED ORDER — TREMFYA ONE-PRESS 100 MG/ML ~~LOC~~ SOAJ
100.0000 mg | SUBCUTANEOUS | 0 refills | Status: DC
  Filled 2024-09-04: qty 1, 56d supply, fill #0

## 2024-09-04 NOTE — Patient Instructions (Addendum)
 Please call the specialty pharmacy to set up your Tremfya  shipment TODAY.  Phone is [phone]  They can set it up for you to pick up from [facility] tomrrow before you go out of town    [employer] We placed an order today for your standing lab work.   Please have your standing labs drawn in January and every 3 months  Please have your labs drawn 2 weeks prior to your appointment so that the provider can discuss your lab results at your appointment, if possible.  Please note that you may see your imaging and lab results in MyChart before we have reviewed them. We will contact you once all results are reviewed. Please allow our office up to 72 hours to thoroughly review all of the results before contacting the office for clarification of your results.  WALK-IN LAB HOURS  Monday through Thursday from 8:00 am -12:30 pm and 1:00 pm-4:30 pm and Friday from 8:00 am-12:00 pm.  Patients with office visits requiring labs will be seen before walk-in labs.  You may encounter longer than normal wait times. Please allow additional time. Wait times may be shorter on  Monday and Thursday afternoons.  We do not book appointments for walk-in labs. We appreciate your patience and understanding with our staff.   Labs are drawn by [facility]. Please bring your co-pay at the time of your lab draw.  You may receive a bill from [facility] for your lab work.  Please note if you are on Hydroxychloroquine and and an order has been placed for a Hydroxychloroquine level,  you will need to have it drawn 4 hours or more after your last dose.  If you wish to have your labs drawn at another location, please call the office 24 hours in advance so we can fax the orders.  The office is located at [address], [place], [place] [address]   If you have any questions regarding directions or hours of operation,  please call [phone].   As a reminder, please drink plenty of water  prior  to coming for your lab work. Thanks!

## 2024-09-04 NOTE — Progress Notes (Signed)
 Office Visit Note  Patient: Samantha Graves             Date of Birth: 1971/05/16           MRN: [id number]             PCP: Okey Vina GAILS, MD Referring: Okey Vina GAILS, MD Visit Date: 09/04/2024 Occupation: Data Unavailable  Subjective:  Medication monitoring  History of Present Illness: Samantha Graves is a 53 y.o. female with history of psoriatic arthritis.  Patient remains on tremfya  100 mg sq injetions once every 8 weeks.  She is tolerating Tremfya  without any side effects and has requested a refill today.  She had ultrasound-guided left second MCP joint cortisone injection on 05/31/2024 which has resolved the discomfort and stiffness she was experiencing.  She has not had any recurrence of symptoms.  She denies any Achilles tendinitis or plantar fasciitis.  She denies any SI joint pain.  Patient states that she continues to have some psoriasis on her scalp in both years and has been using over-the-counter topical agents. [facility] dry needling which has helped to alleviate the pain at the proximal insertion site of the right hamstring as well as discomfort in the left hip.  She has been able to run and has been remaining active.  Activities of Daily Living:  Patient reports morning stiffness for 1 hour.   Patient Reports nocturnal pain.  Difficulty dressing/grooming: Denies Difficulty climbing stairs: Denies Difficulty getting out of chair: Denies Difficulty using hands for taps, buttons, cutlery, and/or writing: Denies  Review of Systems  Constitutional:  Positive for fatigue.  HENT:  Negative for mouth sores and mouth dryness.   Eyes:  Negative for dryness.  Respiratory:  Negative for shortness of breath.   Cardiovascular:  Negative for chest pain and palpitations.  Gastrointestinal:  Negative for blood in stool, constipation and diarrhea.  Endocrine: Negative for increased urination.  Genitourinary:  Negative for  involuntary urination.  Musculoskeletal:  Positive for myalgias, muscle weakness, morning stiffness, muscle tenderness and myalgias. Negative for joint pain, gait problem, joint pain and joint swelling.  Skin:  Negative for color change, rash, hair loss and sensitivity to sunlight.  Allergic/Immunologic: Negative for susceptible to infections.  Neurological:  Negative for dizziness and headaches.  Hematological:  Negative for swollen glands.  Psychiatric/Behavioral:  Positive for sleep disturbance. Negative for depressed mood. The patient is not nervous/anxious.     PMFS History:  Patient Active Problem List   Diagnosis Date Noted   Leg length discrepancy 09/16/2022   Tear of left acetabular labrum 09/16/2022   Psoriasis 01/11/2017   High risk medication use 01/11/2017   PSORIATIC ARTHRITIS 10/14/2009   Allergic rhinitis 09/06/2007    Past Medical History:  Diagnosis Date   ADHD    Bile duct injury 2020   mild   Cholecystitis    Elevated LFTs    MGUS (monoclonal gammopathy of unknown significance) 2021   PONV (postoperative nausea and vomiting)    Psoriatic arthritis (HCC)    Rib fracture     Family History  Problem Relation Age of Onset   Hyperlipidemia Mother    Ovarian cysts Mother        benign   Endometriosis Mother    Liver disease Mother        NASH   Other Father        auto imune   Coronary artery disease Father  cardio ablation   Arthritis/Rheumatoid Sister 34   Endometriosis Sister    Retinitis pigmentosa Sister 16   Ulcerative colitis Sister 21       surgical repair, J pouch   Ovarian cancer Sister 34       negative genetic testing   Liver disease Maternal Aunt        NASH   COPD Maternal Grandmother    Bone cancer Maternal Grandfather 30   Colon cancer Neg Hx    Esophageal cancer Neg Hx    Pancreatic cancer Neg Hx    Stomach cancer Neg Hx    Past Surgical History:  Procedure Laterality Date   CHOLECYSTECTOMY N/A 08/13/2019   Procedure:  LAPAROSCOPIC CHOLECYSTECTOMY;  Surgeon: Ebbie Cough, MD;  Location: [facility] OR;  Service: General;  Laterality: N/A;   INTRAUTERINE DEVICE INSERTION  05/30/15, 05/06/10   MIrena  IUD   TONSILLECTOMY AND ADENOIDECTOMY  1990   WISDOM TOOTH EXTRACTION Bilateral 1991   Social History   Tobacco Use   Smoking status: Never    Passive exposure: Past   Smokeless tobacco: Never  Vaping Use   Vaping status: Never Used  Substance Use Topics   Alcohol use: Yes    Alcohol/week: 3.0 - 4.0 standard drinks of alcohol    Types: 3 - 4 Glasses of wine per week    Comment: occ   Drug use: No   Social History   Social History Narrative   Not on file     Immunization History  Administered Date(s) Administered   Influenza Whole 08/30/2003   Influenza,inj,Quad PF,6+ Mos 09/01/2015, 09/14/2019, 11/14/2020   PFIZER(Purple Top)SARS-COV-2 Vaccination 02/09/2020, 03/03/2020, 09/02/2020   Tdap 02/25/2011, 02/17/2021     Objective: Vital Signs: BP 120/79   Pulse 69   Temp (!) 97.5 F (36.4 C)   Resp 16   Ht 5' 7.5 (1.715 m)   Wt 160 lb 3.2 oz (72.7 kg)   BMI 24.72 kg/m    Physical Exam Vitals and nursing note reviewed.  Constitutional:      Appearance: She is well-developed.  HENT:     Head: Normocephalic and atraumatic.  Eyes:     Conjunctiva/sclera: Conjunctivae normal.  Cardiovascular:     Rate and Rhythm: Normal rate and regular rhythm.     Heart sounds: Normal heart sounds.  Pulmonary:     Effort: Pulmonary effort is normal.     Breath sounds: Normal breath sounds.  Abdominal:     General: Bowel sounds are normal.     Palpations: Abdomen is soft.  Musculoskeletal:     Cervical back: Normal range of motion.  Lymphadenopathy:     Cervical: No cervical adenopathy.  Skin:    General: Skin is warm and dry.     Capillary Refill: Capillary refill takes less than 2 seconds.  Neurological:     Mental Status: She is alert and oriented to person, place, and time.  Psychiatric:         Behavior: Behavior normal.      Musculoskeletal Exam: C-spine, thoracic spine, lumbar spine have good range of motion.  No midline spinal tenderness.  No SI joint tenderness.  Shoulder joints, elbow joints, wrist joints, MCPs, PIPs, DIPs have good range of motion with no synovitis.  Complete fist formation bilaterally.  Hip joints have good range of motion with no groin pain.  Knee joints have good range of motion no warmth or effusion.  Ankle joints have good range of motion no tenderness or  joint swelling.  No evidence of Achilles tendinitis or plantar fasciitis.   CDAI Exam: CDAI Score: -- Patient Global: --; Provider Global: -- Swollen: --; Tender: -- Joint Exam 09/04/2024   No joint exam has been documented for this visit   There is currently no information documented on the homunculus. Go to the Rheumatology activity and complete the homunculus joint exam.  Investigation: No additional findings.  Imaging: No results found.  Recent Labs: Lab Results  Component Value Date   WBC 5.2 05/30/2024   HGB 14.6 05/30/2024   PLT 209 05/30/2024   NA 137 05/30/2024   K 4.7 05/30/2024   CL 101 05/30/2024   CO2 31 05/30/2024   GLUCOSE 77 05/30/2024   BUN 17 05/30/2024   CREATININE 0.76 05/30/2024   BILITOT 0.5 05/30/2024   ALKPHOS 41 03/17/2021   AST 18 05/30/2024   ALT 12 05/30/2024   PROT 6.6 05/30/2024   ALBUMIN 4.2 03/17/2021   CALCIUM 9.4 05/30/2024   GFRAA 107 03/16/2021   QFTBGOLD NEGATIVE 11/04/2017   QFTBGOLDPLUS NEGATIVE 02/29/2024    Speciality Comments: Enbrel, Humira -inadequate response Simponi -04/22 Leflunomide -patient did not to start as she was concerned about the side effects of elevated LFTs.  Procedures:  No procedures performed Allergies: Tamiflu [oseltamivir phosphate]   Assessment / Plan:     Visit Diagnoses: Psoriatic arthritis (HCC): She has no synovitis on examination today.  The pain and inflammation she was experiencing in the left second  MCP joint has resolved since having ultrasound-guided cortisone injection performed on 05/31/2024.  No recurrence of symptoms.  No evidence of Achilles tendinitis or plantar fasciitis.  No SI joint tenderness upon palpation.  She has clinically been doing well on Tremfya  100 mg subcutaneous injections once every 8 weeks.  She is tolerating Tremfya  without any side effects.  She is due for her dose of Tremfya  today but requires a refill.  Refill sent to the pharmacy today.  Plan to update CBC with differential and CMP with GFR today.  She will notify us  if she develops any signs or symptoms of a flare.  She will follow-up in the office in 3 months or sooner if needed.  High risk medication use -Tremfya  100 mg sq injections once every 8 weeks.  Tremfya  was initiated on 10/06/2023.  Previous therapy includes Enbrel, Humira , Simponi   plan: guselkumab  (TREMFYA  ONE-PRESS) 100 MG/ML pen, Comprehensive metabolic panel with GFR, CBC with Differential/Platelet CBC and CMP updated on 05/30/24.  Orders for CBC and CMP released today.  Her next lab work will be due in January and every 3 months to monitor for drug toxicity. TB gold negative 02/29/24.  She recently was diagnosed with COVID-19 but did not require a gap in therapy.  Discussed importance of holding Tremfya  if she develops signs or symptoms of infection and to resume once the infection is completely cleared. - Plan: guselkumab  (TREMFYA  ONE-PRESS) 100 MG/ML pen, Comprehensive metabolic panel with GFR, CBC with Differential/Platelet  Psoriasis - Scalp and ear canals-she has been using topical agents as needed.  She declined a prescription strength topical agent at this time.  She will remain on Tremfya  as prescribed.  She is vies notify us  if the symptoms persist or worsen.  Plan: guselkumab  (TREMFYA  ONE-PRESS) 100 MG/ML pen, Comprehensive metabolic panel with GFR, CBC with Differential/Platelet  Pain in both hands - X-rays performed on 12/02/2022 which were  consistent with psoriatic arthritis and osteoarthritis overlap.  No radiographic progression compared to 2019.  Patient had an ultrasound-guided  left second MCP joint cortisone injection performed on 05/31/2024 which provided significant relief.  No tenderness or synovitis noted on exam.  Chronic SI joint pain: No SI joint tenderness upon palpation.  Pain in left hip - Evaluated by Dr. Chick.  Improved with dry needling performed at [facility].  Trochanteric bursitis of both hips: Not currently symptomatic.  She has been going to [facility] for dry needling.   Pain in both feet - X-rays osteoarthritic changes. A cyst or erosion was noted in the left fifth metatarsal head.  Bunion formation noted over the left first MTP joint.  She has good range of motion of both ankle joints with no tenderness or synovitis.  No evidence of Achilles tendinitis or plantar fasciitis.  Other medical conditions are listed as follows:   Plantar fasciitis: Not currently symptomatic.   Monoclonal gammopathy  Elevated LFTs - CMP updated today. Plan: Comprehensive metabolic panel with GFR  Orders: Orders Placed This Encounter  Procedures   Comprehensive metabolic panel with GFR   CBC with Differential/Platelet   Meds ordered this encounter  Medications   guselkumab  (TREMFYA  ONE-PRESS) 100 MG/ML pen    Sig: Inject 1 mL (100 mg total) into the skin every 8 (eight) weeks.    Dispense:  1 mL    Refill:  0     Follow-Up Instructions: Return in about 3 months (around 12/05/2024) for Psoriatic arthritis.   Waddell CHRISTELLA Craze, PA-C  Note - This record has been created using Dragon software.  Chart creation errors have been sought, but may not always  have been located. Such creation errors do not reflect on  the standard of medical care.

## 2024-09-05 ENCOUNTER — Ambulatory Visit: Payer: Self-pay | Admitting: Physician Assistant

## 2024-09-05 NOTE — Progress Notes (Signed)
 CBC and CMP WNL

## 2024-09-22 DIAGNOSIS — H5213 Myopia, bilateral: Secondary | ICD-10-CM | POA: Diagnosis not present

## 2024-10-02 DIAGNOSIS — E538 Deficiency of other specified B group vitamins: Secondary | ICD-10-CM | POA: Diagnosis not present

## 2024-10-02 DIAGNOSIS — E559 Vitamin D deficiency, unspecified: Secondary | ICD-10-CM | POA: Diagnosis not present

## 2024-10-02 DIAGNOSIS — N951 Menopausal and female climacteric states: Secondary | ICD-10-CM | POA: Diagnosis not present

## 2024-10-02 DIAGNOSIS — E039 Hypothyroidism, unspecified: Secondary | ICD-10-CM | POA: Diagnosis not present

## 2024-10-02 DIAGNOSIS — R635 Abnormal weight gain: Secondary | ICD-10-CM | POA: Diagnosis not present

## 2024-10-24 ENCOUNTER — Other Ambulatory Visit (HOSPITAL_COMMUNITY): Payer: Self-pay

## 2024-10-24 ENCOUNTER — Other Ambulatory Visit: Payer: Self-pay | Admitting: Physician Assistant

## 2024-10-24 DIAGNOSIS — Z79899 Other long term (current) drug therapy: Secondary | ICD-10-CM

## 2024-10-24 DIAGNOSIS — L409 Psoriasis, unspecified: Secondary | ICD-10-CM

## 2024-10-24 DIAGNOSIS — L4050 Arthropathic psoriasis, unspecified: Secondary | ICD-10-CM

## 2024-10-24 NOTE — Telephone Encounter (Signed)
 Last Fill: 09/04/2024  Labs: 09/04/2024  TB Gold: 02/29/2024  TB gold negative   Next Visit: 12/11/2024  Last Visit: 09/04/2024  IK:Ednmpjupr arthritis (HCC)   Current Dose per office note 09/04/2024: Tremfya  100 mg sq injections once every 8 weeks.   Okay to refill Tremfyia?

## 2024-10-27 MED ORDER — GUSELKUMAB 100 MG/ML ~~LOC~~ SOAJ
100.0000 mg | SUBCUTANEOUS | 0 refills | Status: AC
  Filled 2024-10-27 – 2024-11-27 (×3): qty 1, 56d supply, fill #0

## 2024-10-29 ENCOUNTER — Other Ambulatory Visit (HOSPITAL_COMMUNITY): Payer: Self-pay

## 2024-10-29 ENCOUNTER — Other Ambulatory Visit: Payer: Self-pay

## 2024-10-31 ENCOUNTER — Other Ambulatory Visit (HOSPITAL_COMMUNITY): Payer: Self-pay

## 2024-11-02 ENCOUNTER — Other Ambulatory Visit: Payer: Self-pay

## 2024-11-12 ENCOUNTER — Ambulatory Visit: Admitting: Orthopedic Surgery

## 2024-11-12 ENCOUNTER — Other Ambulatory Visit: Payer: Self-pay

## 2024-11-12 ENCOUNTER — Encounter: Payer: Self-pay | Admitting: Orthopedic Surgery

## 2024-11-12 DIAGNOSIS — M542 Cervicalgia: Secondary | ICD-10-CM

## 2024-11-12 MED ORDER — METHOCARBAMOL 500 MG PO TABS: 500.0000 mg | ORAL_TABLET | Freq: Three times a day (TID) | ORAL | 0 refills | Status: AC | PRN

## 2024-11-12 MED ORDER — PREDNISONE 5 MG (21) PO TBPK: ORAL_TABLET | ORAL | 0 refills | Status: AC

## 2024-11-12 NOTE — Progress Notes (Signed)
 Office Visit Note   Patient: Samantha Graves           Date of Birth: [birth year]           MRN: [id number] Visit Date: 11/12/2024 Requested by: Okey Vina GAILS, MD [address] [place],  [place] [address] PCP: Okey Vina GAILS, MD  Subjective: Chief Complaint  Patient presents with   Neck - Pain    Acute onset of neck pain since Friday. It has gradually worsened. Patient reports decreased range of motion in her neck and is having left radicular arm pain with constant aching sensation. Reports numbness and tingling down left arm. Did have a fall about a week ago but states she landed on her right side.     HPI: Samantha Graves is a 53 y.o. female who presents to the office reporting severe neck and left arm pain.  Started 2 days ago with a crick in her neck.  Pain worsened and now she reports radiating pain down the arm to her small finger as well as scapular pain and weakness and loss of dexterity in the hand.  She states that she did fall about 10 days ago but no loss of consciousness and was doing well up until 2 days ago.  Has tried ice heat ibuprofen and Tylenol .  Patient does have autoimmune disease and takes a biologic for that every 8 weeks.  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                ROS: All systems reviewed are negative as they relate to the chief complaint within the history of present illness.  Patient denies fevers or chills.  Assessment & Plan: Visit Diagnoses:  1. Neck pain on left side     Plan: Impression is left-sided radiculopathy from the cervical spine with weakness.  Plan stat MRI cervical spine to evaluate left-sided radiculopathy with weakness.  Robaxin  and Medrol Dosepak sent in.  Soft collar also to be worn for comfort.  Particularly when sleeping.  I can call her and let her know what that scan shows but in general I  think Hillery is going to be more inclined to try injection treatment first before surgical intervention.  She had less pain than I would expect from the pathology that was present on the right-hand side 9 years ago.  I think that makes it more likely that the pathology she has now is even greater based on her symptoms.  Follow-Up Instructions: No follow-ups on file.   Orders:  Orders Placed This Encounter  Procedures   XR Cervical Spine 2 or 3 views   MR Cervical Spine w/o contrast   Meds ordered this encounter  Medications   predniSONE  (STERAPRED UNI-PAK 21 TAB) 5 MG (21) TBPK tablet    Sig: Take dosepak as directed    Dispense:  21 tablet    Refill:  0   methocarbamol  (ROBAXIN ) 500 MG tablet    Sig: Take 1 tablet (500 mg total) by mouth every 8 (eight) hours as needed for muscle spasms.    Dispense:  30 tablet    Refill:  0      Procedures: No procedures performed   Clinical Data: No additional findings.  Objective: Vital Signs: There were no vitals taken for this visit.  Physical Exam:  Constitutional: Patient appears well-developed  HEENT:  Head: Normocephalic Eyes:EOM are normal Neck: Normal range of motion Cardiovascular: Normal rate Pulmonary/chest: Effort normal Neurologic: Patient is alert Skin: Skin is warm Psychiatric: Patient has normal mood and affect  Ortho Exam: Ortho exam demonstrates painful rotation of the head to the right.  She does have diminished flexion and extension.  Does have paresthesias in that C8 distribution on the left but not the right.  Interosseous is 4- out of 4 on the left 5+ out of 5 on the right.  Otherwise the patient has intact EPL FPL strength along with wrist flexion and extension strength as well as bicep triceps and deltoid strength bilaterally.  Reflexes 3+ out of 4 bilateral biceps and triceps.  No muscle wasting in that left arm.  Specialty Comments:  No specialty comments available.  Imaging: No results  found.   PMFS History: Patient Active Problem List   Diagnosis Date Noted   Leg length discrepancy 09/16/2022   Tear of left acetabular labrum 09/16/2022   Psoriasis 01/11/2017   High risk medication use 01/11/2017   PSORIATIC ARTHRITIS 10/14/2009   Allergic rhinitis 09/06/2007   Past Medical History:  Diagnosis Date   ADHD    Bile duct injury 2020   mild   Cholecystitis    Elevated LFTs    MGUS (monoclonal gammopathy of unknown significance) 2021   PONV (postoperative nausea and vomiting)    Psoriatic arthritis (HCC)    Rib fracture     Family History  Problem Relation Age of Onset   Hyperlipidemia Mother    Ovarian cysts Mother        benign   Endometriosis Mother    Liver disease Mother        NASH   Other Father        auto imune   Coronary artery disease Father        cardio ablation   Arthritis/Rheumatoid Sister 50   Endometriosis Sister    Retinitis pigmentosa Sister 89   Ulcerative colitis Sister 21       surgical repair, J pouch   Ovarian cancer Sister 95       negative genetic testing   Liver disease Maternal Aunt        NASH   COPD Maternal Grandmother    Bone cancer Maternal Grandfather 19   Colon cancer Neg Hx    Esophageal cancer Neg Hx    Pancreatic cancer Neg Hx    Stomach cancer Neg Hx     Past Surgical History:  Procedure Laterality Date   CHOLECYSTECTOMY N/A 08/13/2019   Procedure: LAPAROSCOPIC CHOLECYSTECTOMY;  Surgeon: Ebbie Cough, MD;  Location: [facility] OR;  Service: General;  Laterality: N/A;   INTRAUTERINE DEVICE INSERTION  05/30/15, 05/06/10   MIrena  IUD   TONSILLECTOMY AND ADENOIDECTOMY  1990   WISDOM TOOTH EXTRACTION Bilateral 1991   Social History   Occupational History   Not on file  Tobacco Use   Smoking status: Never    Passive exposure: Past   Smokeless tobacco: Never  Vaping Use   Vaping status: Never Used  Substance and Sexual Activity   Alcohol use: Yes    Alcohol/week: 3.0 - 4.0 standard drinks of alcohol     Types: 3 - 4 Glasses of wine per week    Comment: occ   Drug use: No   Sexual activity: Yes    Birth control/protection: I.U.D.    Comment: Mirena  05-30-15

## 2024-11-13 ENCOUNTER — Inpatient Hospital Stay
Admission: RE | Admit: 2024-11-13 | Discharge: 2024-11-13 | Attending: Orthopedic Surgery | Admitting: Orthopedic Surgery

## 2024-11-13 ENCOUNTER — Ambulatory Visit: Payer: Self-pay | Admitting: Orthopedic Surgery

## 2024-11-13 DIAGNOSIS — M47816 Spondylosis without myelopathy or radiculopathy, lumbar region: Secondary | ICD-10-CM | POA: Diagnosis not present

## 2024-11-13 DIAGNOSIS — M542 Cervicalgia: Secondary | ICD-10-CM

## 2024-11-13 DIAGNOSIS — M50221 Other cervical disc displacement at C4-C5 level: Secondary | ICD-10-CM | POA: Diagnosis not present

## 2024-11-13 DIAGNOSIS — M50223 Other cervical disc displacement at C6-C7 level: Secondary | ICD-10-CM | POA: Diagnosis not present

## 2024-11-13 DIAGNOSIS — M50222 Other cervical disc displacement at C5-C6 level: Secondary | ICD-10-CM | POA: Diagnosis not present

## 2024-11-13 NOTE — Progress Notes (Signed)
 Hi Samantha Graves.  I just talked with [facility].  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  She has seen Dr. Eldonna before can you see if he would be willing to work her in sooner rather than later for a cervical spine ESI.  That would be great.  Thanks

## 2024-11-14 ENCOUNTER — Other Ambulatory Visit: Payer: Self-pay

## 2024-11-14 ENCOUNTER — Other Ambulatory Visit: Payer: Self-pay | Admitting: Physical Medicine and Rehabilitation

## 2024-11-14 DIAGNOSIS — M5412 Radiculopathy, cervical region: Secondary | ICD-10-CM

## 2024-11-15 NOTE — Progress Notes (Signed)
 Left vm on [facility] phone to call and see what availibility is.

## 2024-11-16 NOTE — Progress Notes (Signed)
 Pt is scheduled at [facility] on 12/29 for injection

## 2024-11-23 NOTE — Discharge Instructions (Signed)

## 2024-11-26 ENCOUNTER — Inpatient Hospital Stay
Admission: RE | Admit: 2024-11-26 | Discharge: 2024-11-26 | Disposition: A | Discharge status: Home / Self Care | Source: Ambulatory Visit | Attending: Physical Medicine and Rehabilitation | Admitting: Physical Medicine and Rehabilitation

## 2024-11-27 ENCOUNTER — Other Ambulatory Visit: Payer: Self-pay

## 2024-11-27 ENCOUNTER — Other Ambulatory Visit (HOSPITAL_COMMUNITY): Payer: Self-pay

## 2024-11-27 NOTE — Progress Notes (Unsigned)
 "  Office Visit Note  Patient: Samantha Graves             Date of Birth: March 07, 1971           MRN: [id number]             PCP: Okey Vina GAILS, MD Referring: Okey Vina GAILS, MD Visit Date: 12/11/2024 Occupation: Data Unavailable  Subjective:  No chief complaint on file.   History of Present Illness: Samantha Graves is a 53 y.o. female ***     Activities of Daily Living:  Patient reports morning stiffness for *** {minute/hour:19697}.   Patient {ACTIONS;DENIES/REPORTS:21021675::Denies} nocturnal pain.  Difficulty dressing/grooming: {ACTIONS;DENIES/REPORTS:21021675::Denies} Difficulty climbing stairs: {ACTIONS;DENIES/REPORTS:21021675::Denies} Difficulty getting out of chair: {ACTIONS;DENIES/REPORTS:21021675::Denies} Difficulty using hands for taps, buttons, cutlery, and/or writing: {ACTIONS;DENIES/REPORTS:21021675::Denies}  No Rheumatology ROS completed.   PMFS History:  Patient Active Problem List   Diagnosis Date Noted   Leg length discrepancy 09/16/2022   Tear of left acetabular labrum 09/16/2022   Psoriasis 01/11/2017   High risk medication use 01/11/2017   PSORIATIC ARTHRITIS 10/14/2009   Allergic rhinitis 09/06/2007    Past Medical History:  Diagnosis Date   ADHD    Bile duct injury 2020   mild   Cholecystitis    Elevated LFTs    MGUS (monoclonal gammopathy of unknown significance) 2021   PONV (postoperative nausea and vomiting)    Psoriatic arthritis (HCC)    Rib fracture     Family History  Problem Relation Age of Onset   Hyperlipidemia Mother    Ovarian cysts Mother        benign   Endometriosis Mother    Liver disease Mother        NASH   Other Father        auto imune   Coronary artery disease Father        cardio ablation   Arthritis/Rheumatoid Sister 55   Endometriosis Sister    Retinitis pigmentosa Sister 95   Ulcerative colitis Sister 21       surgical repair, J pouch   Ovarian cancer Sister 74       negative genetic  testing   Liver disease Maternal Aunt        NASH   COPD Maternal Grandmother    Bone cancer Maternal Grandfather 1   Colon cancer Neg Hx    Esophageal cancer Neg Hx    Pancreatic cancer Neg Hx    Stomach cancer Neg Hx    Past Surgical History:  Procedure Laterality Date   CHOLECYSTECTOMY N/A 08/13/2019   Procedure: LAPAROSCOPIC CHOLECYSTECTOMY;  Surgeon: Ebbie Cough, MD;  Location: [facility] OR;  Service: General;  Laterality: N/A;   INTRAUTERINE DEVICE INSERTION  05/30/15, 05/06/10   MIrena  IUD   TONSILLECTOMY AND ADENOIDECTOMY  1990   WISDOM TOOTH EXTRACTION Bilateral 1991   Social History[1] Social History   Social History Narrative   Not on file     Immunization History  Administered Date(s) Administered   Influenza Whole 08/30/2003   Influenza,inj,Quad PF,6+ Mos 09/01/2015, 09/14/2019, 11/14/2020   PFIZER(Purple Top)SARS-COV-2 Vaccination 02/09/2020, 03/03/2020, 09/02/2020   Tdap 02/25/2011, 02/17/2021     Objective: Vital Signs: There were no vitals taken for this visit.   Physical Exam   Musculoskeletal Exam: ***  CDAI Exam: CDAI Score: -- Patient Global: --; Provider Global: -- Swollen: --; Tender: -- Joint Exam 12/11/2024   No joint exam has been documented for this visit   There is currently no information documented  on the homunculus. Go to the Rheumatology activity and complete the homunculus joint exam.  Investigation: No additional findings.  Imaging: MR Cervical Spine w/o contrast Result Date: 11/13/2024 CLINICAL DATA:  Neck pain and left-sided weakness 4 days. EXAM: MRI CERVICAL SPINE WITHOUT CONTRAST TECHNIQUE: Multiplanar, multisequence MR imaging of the cervical spine was performed. No intravenous contrast was administered. COMPARISON:  Cervical spine radiographs 11/12/2024 and prior MRI cervical spine from 2016 FINDINGS: Alignment: Normal Vertebrae: No fracture, evidence of discitis, or bone lesion. Cord: Normal cord signal intensity.  No  cord lesions or syrinx. Posterior Fossa, vertebral arteries, paraspinal tissues: No significant findings. Disc levels: C2-3: No significant findings. C3-4: No significant findings. C4-5: Slight annular bulge and asymmetric right-sided facet disease contributing to mild right foraminal narrowing. C5-6: Shallow broad-based disc protrusion with a right foraminal component. There is also right-sided uncinate spurring and facet disease all contributing to moderate to significant right foraminal stenosis. These findings are stable when compared to the prior study. No left foraminal stenosis. C6-7: Mild diffuse annular bulge, uncinate spurring and mild facet disease with slight flattening of the ventral thecal sac but no significant spinal stenosis. Mild foraminal narrowing bilaterally but no significant stenosis. C7-T1: No significant findings. IMPRESSION: 1. Mild right foraminal narrowing at C4-5. 2. Stable shallow broad-based disc protrusion with a right foraminal component at C5-6. There is also right-sided uncinate spurring and facet disease all contributing to moderate to significant right foraminal stenosis. These findings are stable when compared to the prior. 3. Mild bilateral foraminal narrowing at C6-7 but no significant stenosis. Electronically Signed   By: MYRTIS Stammer M.D.   On: 11/13/2024 09:53   XR Cervical Spine 2 or 3 views Result Date: 11/12/2024 AP lateral radiographs cervical spine reviewed.  No fracture no spondylolisthesis.  Disc bases maintained.  Slight loss of lordosis.  Minimal facet arthritis.   Recent Labs: Lab Results  Component Value Date   WBC 5.1 09/04/2024   HGB 13.1 09/04/2024   PLT 202 09/04/2024   NA 138 09/04/2024   K 4.7 09/04/2024   CL 105 09/04/2024   CO2 29 09/04/2024   GLUCOSE 85 09/04/2024   BUN 18 09/04/2024   CREATININE 0.88 09/04/2024   BILITOT 0.4 09/04/2024   ALKPHOS 41 03/17/2021   AST 18 09/04/2024   ALT 14 09/04/2024   PROT 6.5 09/04/2024    ALBUMIN 4.2 03/17/2021   CALCIUM 9.8 09/04/2024   GFRAA 107 03/16/2021   QFTBGOLD NEGATIVE 11/04/2017   QFTBGOLDPLUS NEGATIVE 02/29/2024    Speciality Comments: Enbrel, Humira -inadequate response Simponi -04/22 Leflunomide -patient did not to start as she was concerned about the side effects of elevated LFTs.  Procedures:  No procedures performed Allergies: Tamiflu [oseltamivir phosphate]   Assessment / Plan:     Visit Diagnoses: Psoriatic arthritis (HCC)  Psoriasis  High risk medication use  Pain in both hands  Chronic SI joint pain  Pain in left hip  Trochanteric bursitis of both hips  Pain in both feet  Plantar fasciitis  Monoclonal gammopathy  Elevated LFTs  Orders: No orders of the defined types were placed in this encounter.  No orders of the defined types were placed in this encounter.   Face-to-face time spent with patient was *** minutes. Greater than 50% of time was spent in counseling and coordination of care.  Follow-Up Instructions: No follow-ups on file.   Waddell CHRISTELLA Craze, PA-C  Note - This record has been created using Dragon software.  Chart creation errors have been sought,  but may not always  have been located. Such creation errors do not reflect on  the standard of medical care.     [1]  Social History Tobacco Use   Smoking status: Never    Passive exposure: Past   Smokeless tobacco: Never  Vaping Use   Vaping status: Never Used  Substance Use Topics   Alcohol use: Yes    Alcohol/week: 3.0 - 4.0 standard drinks of alcohol    Types: 3 - 4 Glasses of wine per week    Comment: occ   Drug use: No   "

## 2024-11-27 NOTE — Progress Notes (Signed)
 Specialty Pharmacy Refill Coordination Note  Spoke with [facility] Samantha Graves Samantha Graves is a 53 y.o. female contacted today regarding refills of specialty medication(s) Guselkumab  (TREMFYA )  Doses on hand: 0   Next inj: 11/27/24  Patient requested: [name] at [facility] [place] date: 11/27/24  Medication will be filled on 11/27/24

## 2024-11-30 ENCOUNTER — Other Ambulatory Visit (HOSPITAL_BASED_OUTPATIENT_CLINIC_OR_DEPARTMENT_OTHER): Payer: Self-pay | Admitting: Internal Medicine

## 2024-11-30 ENCOUNTER — Encounter (HOSPITAL_BASED_OUTPATIENT_CLINIC_OR_DEPARTMENT_OTHER): Payer: Self-pay | Admitting: Radiology

## 2024-11-30 ENCOUNTER — Other Ambulatory Visit (HOSPITAL_BASED_OUTPATIENT_CLINIC_OR_DEPARTMENT_OTHER): Payer: Self-pay

## 2024-11-30 ENCOUNTER — Ambulatory Visit (HOSPITAL_BASED_OUTPATIENT_CLINIC_OR_DEPARTMENT_OTHER)
Admission: RE | Admit: 2024-11-30 | Discharge: 2024-11-30 | Disposition: A | Discharge status: Home / Self Care | Source: Ambulatory Visit

## 2024-11-30 DIAGNOSIS — Z1231 Encounter for screening mammogram for malignant neoplasm of breast: Secondary | ICD-10-CM

## 2024-12-11 ENCOUNTER — Ambulatory Visit: Admitting: Physician Assistant

## 2024-12-11 DIAGNOSIS — M7061 Trochanteric bursitis, right hip: Secondary | ICD-10-CM

## 2024-12-11 DIAGNOSIS — L4050 Arthropathic psoriasis, unspecified: Secondary | ICD-10-CM

## 2024-12-11 DIAGNOSIS — M79671 Pain in right foot: Secondary | ICD-10-CM

## 2024-12-11 DIAGNOSIS — M25552 Pain in left hip: Secondary | ICD-10-CM

## 2024-12-11 DIAGNOSIS — M722 Plantar fascial fibromatosis: Secondary | ICD-10-CM

## 2024-12-11 DIAGNOSIS — L409 Psoriasis, unspecified: Secondary | ICD-10-CM

## 2024-12-11 DIAGNOSIS — M79641 Pain in right hand: Secondary | ICD-10-CM

## 2024-12-11 DIAGNOSIS — D472 Monoclonal gammopathy: Secondary | ICD-10-CM

## 2024-12-11 DIAGNOSIS — Z79899 Other long term (current) drug therapy: Secondary | ICD-10-CM

## 2024-12-11 DIAGNOSIS — G8929 Other chronic pain: Secondary | ICD-10-CM

## 2024-12-11 DIAGNOSIS — R7989 Other specified abnormal findings of blood chemistry: Secondary | ICD-10-CM

## 2024-12-13 ENCOUNTER — Other Ambulatory Visit (HOSPITAL_COMMUNITY): Payer: Self-pay

## 2024-12-26 ENCOUNTER — Other Ambulatory Visit: Payer: Self-pay

## 2025-01-22 ENCOUNTER — Ambulatory Visit: Admitting: Physician Assistant

## 2025-06-12 ENCOUNTER — Ambulatory Visit: Payer: Self-pay | Admitting: Obstetrics and Gynecology
